# Patient Record
Sex: Male | Born: 1935
Health system: Southern US, Community
[De-identification: ages and names within clinical notes are randomized; demographics above are authoritative.]

## PROBLEM LIST (undated history)

## (undated) DIAGNOSIS — I4891 Unspecified atrial fibrillation: Secondary | ICD-10-CM

## (undated) DIAGNOSIS — H3530 Unspecified macular degeneration: Secondary | ICD-10-CM

## (undated) DIAGNOSIS — I10 Essential (primary) hypertension: Secondary | ICD-10-CM

## (undated) DIAGNOSIS — C801 Malignant (primary) neoplasm, unspecified: Secondary | ICD-10-CM

## (undated) DIAGNOSIS — H409 Unspecified glaucoma: Secondary | ICD-10-CM

## (undated) DIAGNOSIS — I493 Ventricular premature depolarization: Secondary | ICD-10-CM

## (undated) DIAGNOSIS — R1310 Dysphagia, unspecified: Secondary | ICD-10-CM

## (undated) DIAGNOSIS — I509 Heart failure, unspecified: Secondary | ICD-10-CM

## (undated) HISTORY — DX: Dysphagia, unspecified: R13.10

## (undated) HISTORY — PX: TONSILLECTOMY: SUR1361

## (undated) HISTORY — PX: NASAL SEPTUM SURGERY: SHX37

---

## 2006-01-09 ENCOUNTER — Inpatient Hospital Stay (HOSPITAL_COMMUNITY): Admission: EM | Admit: 2006-01-09 | Discharge: 2006-01-10 | Payer: Self-pay | Admitting: Emergency Medicine

## 2007-01-14 ENCOUNTER — Encounter: Admission: RE | Admit: 2007-01-14 | Discharge: 2007-01-14 | Payer: Self-pay | Admitting: Family Medicine

## 2008-04-22 ENCOUNTER — Ambulatory Visit (HOSPITAL_COMMUNITY): Admission: RE | Admit: 2008-04-22 | Discharge: 2008-04-22 | Payer: Self-pay | Admitting: Ophthalmology

## 2008-07-08 ENCOUNTER — Emergency Department (HOSPITAL_COMMUNITY): Admission: EM | Admit: 2008-07-08 | Discharge: 2008-07-09 | Payer: Self-pay | Admitting: Emergency Medicine

## 2008-08-08 HISTORY — PX: OTHER SURGICAL HISTORY: SHX169

## 2009-12-22 ENCOUNTER — Encounter: Admission: RE | Admit: 2009-12-22 | Discharge: 2009-12-22 | Payer: Self-pay | Admitting: Otolaryngology

## 2010-01-12 ENCOUNTER — Emergency Department (HOSPITAL_COMMUNITY): Admission: EM | Admit: 2010-01-12 | Discharge: 2010-01-13 | Payer: Self-pay | Admitting: Emergency Medicine

## 2010-02-05 ENCOUNTER — Encounter (INDEPENDENT_AMBULATORY_CARE_PROVIDER_SITE_OTHER): Payer: Self-pay | Admitting: Otolaryngology

## 2010-02-05 ENCOUNTER — Ambulatory Visit (HOSPITAL_COMMUNITY): Admission: RE | Admit: 2010-02-05 | Discharge: 2010-02-06 | Payer: Self-pay | Admitting: Otolaryngology

## 2010-08-08 HISTORY — PX: OTHER SURGICAL HISTORY: SHX169

## 2010-10-24 LAB — BASIC METABOLIC PANEL
Calcium: 9.3 mg/dL (ref 8.4–10.5)
Creatinine, Ser: 1.07 mg/dL (ref 0.4–1.5)
GFR calc non Af Amer: >60 mL/min (ref >60)
Glucose, Bld: 100 mg/dL — ABNORMAL HIGH (ref 70–99)
Sodium: 136 mEq/L (ref 135–145)

## 2010-10-24 LAB — CBC
MCHC: 33.6 g/dL (ref 30.0–36.0)
MCV: 99.2 fL (ref 78.0–100.0)
RBC: 4.59 MIL/uL (ref 4.22–5.81)
WBC: 8.1 10*3/uL (ref 4.0–10.5)

## 2010-10-25 LAB — BASIC METABOLIC PANEL
CO2: 28 mEq/L (ref 19–32)
Creatinine, Ser: 1.14 mg/dL (ref 0.4–1.5)
GFR calc Af Amer: >60 mL/min (ref >60)
Glucose, Bld: 104 mg/dL — ABNORMAL HIGH (ref 70–99)

## 2010-10-25 LAB — CBC
Hemoglobin: 14.3 g/dL (ref 13.0–17.0)
MCHC: 33.4 g/dL (ref 30.0–36.0)
MCV: 97.9 fL (ref 78.0–100.0)
RDW: 13.6 % (ref 11.5–15.5)
WBC: 7.8 10*3/uL (ref 4.0–10.5)

## 2010-10-25 LAB — DIFFERENTIAL
Basophils Absolute: 0 10*3/uL (ref 0.0–0.1)
Basophils Relative: 0 % (ref 0–1)
Eosinophils Absolute: 0.4 10*3/uL (ref 0.0–0.7)
Eosinophils Relative: 5 % (ref 0–5)
Monocytes Absolute: 1.3 10*3/uL — ABNORMAL HIGH (ref 0.1–1.0)
Neutro Abs: 4.8 10*3/uL (ref 1.7–7.7)

## 2010-10-25 LAB — POCT CARDIAC MARKERS

## 2010-12-24 NOTE — Discharge Summary (Signed)
NAME:  David Acosta, David Acosta NO.:  [id number]   MEDICAL RECORD NO.:  [id number]          PATIENT TYPE:  INP   LOCATION:  A340                          FACILITY:  [facility]   PHYSICIAN:  Margaretmary Dys, M.D.DATE OF BIRTH:  [birth year]   DATE OF ADMISSION:  01/09/2006  DATE OF DISCHARGE:  06/05/2007LH                                 DISCHARGE SUMMARY   DISCHARGE DIAGNOSES:  1.  Acute abdominal pain.  2.  Acute gastroenteritis.  3.  Partial small bowel obstruction.   DISCHARGE MEDICATIONS:  1.  Verapamil 240 mg p.o. once a day.  2.  Hydrochlorothiazide 25 mg once a day.  3.  Aspirin 81 mg once a day.   DIET:  Low salt diet.   ACTIVITY:  Is as tolerated.   SPECIAL INSTRUCTIONS:  Patient is to followup with primary care physician as  needed.  Patient to return to the emergency room if abdominal pain returns  with distention.   SPECIAL INVESTIGATIONS DONE:  A CT scan of the abdomen and pelvis was  reported to be negative.   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  The patient  presented to the emergency room with some aching abdominal pain.  He said  the pain was initially centrally located with  abdominal distention.  The  patient did not have any nausea or vomiting.  He tried unsuccessfully to  induce vomiting to feel better but was not successful.  He subsequently came  to the emergency room.  Initial evaluation in the emergency room revealed a  possible small bowel obstruction on x-rays.  The patient was subsequently  admitted for further workup.   We proceeded with a CT scan of his abdomen and pelvis which was negative.  Patient did have a history of ventral hernia but there was no evidence of  incarceration.  During the course of hospitalization his white blood cell  count was 10.5, hemoglobin 15.9, platelets of 226, urinalysis was negative  and the rest of his labs  were really unremarkable.  Patient was subsequently  on observation status and once CAT scan was negative in the morning the  patient was discharged home in satisfactory condition.   DISPOSITION:  To home.   CONSULTS OBTAINED:  None.      Margaretmary Dys, M.D.  Electronically Signed     AM/MEDQ  D:  01/22/2006  T:  01/22/2006  Job:  [id number]

## 2010-12-24 NOTE — H&P (Signed)
NAME:  David Acosta, David Acosta NO.:  [id number]   MEDICAL RECORD NO.:  [id number]          PATIENT TYPE:  INP   LOCATION:  A340                          FACILITY:  [facility]   PHYSICIAN:  Lonia Blood, M.D.      DATE OF BIRTH:  1926-06-18   DATE OF ADMISSION:  01/09/2006  DATE OF DISCHARGE:  LH                                HISTORY & PHYSICAL   PRIMARY CARE PHYSICIAN:  Gloriajean Dell. Andrey Campanile, M.D.   PRESENTING COMPLAINT:  Abdominal pain.   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  Yesterday morning he woke up normally.  He had a normal bowel movements.  Later, however, he started having some aching abdominal pain.  His pain was  initially centrally located but not all over.  Associated with that was  gradual abdominal distention.  The patient felt uncomfortable but denied any  nausea or vomiting.  He tried unsuccessfully in induce vomiting to feel  better, but that did not happen.  His abdominal pain persisted, hence, he  came to the emergency room.  Initial evaluation in the emergency room  suggested possible small bowel obstruction.  Hence, he is admitted for  further workup.  The patient denied any diarrhea.  No melena.  No bright red  blood per rectum and no hematemesis.   PAST MEDICAL HISTORY:  1.  Significant for hypertension and some glaucoma, especially of the left      eye.  2.  Abdominal hernia.   PAST SURGICAL HISTORY:  None.   ALLERGIES:  No known drug allergies.   MEDICATIONS:  1.  Verapamil 240 mg daily.  2.  Hydrochlorothiazide 25 mg daily.  3.  Aspirin 81 mg daily.   SOCIAL HISTORY:  The patient is a retired Production designer, theatre/television/film at [employer].  He is  a Forensic scientist by profession.  He retired to [employer] from  [place] and stayed at [place].  No history of  tobacco use, but the patient drinks daily, at least 4 beers a day.  As  he  calls it cocktail hour every day between 4:00 and 6:00.   FAMILY HISTORY:  Noncontributory.   REVIEW OF SYSTEMS:  A 10-point review of systems was performed mainly by  HPI.   PHYSICAL EXAMINATION:  VITAL SIGNS:  Temperature 96.6, blood pressure  initial 146/84 and currently 122/70, pulse 68, respirations 20, saturations  95% on room air.  GENERAL APPEARANCE:  The patient is a very pleasant man in no acute  distress.  HEENT:  Pupils equal, round and reactive to light.  EOMI.  NECK:  Supple.  No visible JVD.  No lymphadenopathy.  RESPIRATORY:  The patient has good air entry bilaterally with no wheezes or  rales.  CARDIOVASCULAR:  He has regular rate and rhythm.  ABDOMEN:  Distended, firm, nontender with increased bowel sounds.  RECTAL:  No stool in the rectum.  EXTREMITIES:  No cyanosis, clubbing or edema.   LABORATORY DATA:  White count 10.5,  hemoglobin 15.9, platelets 226,000, with  a left shift of 8.6.  Sodium 136, potassium 3.5, chloride 99, CO2 28,  glucose 158, BUN 15, creatinine 1.1, calcium 9.1, amylase 66, lipase 15.  UA  showed specific gravity of more than 1.030, small bilirubins, small ketones,  small proteins, but negative leukocyte esterase or nitrites.  Urine  microscopy is also negative.  KUB shows multiple air fluid levels with  suggestions of partial small bowel obstruction.   ASSESSMENT:  We have a 77 year old gentleman admitted with sudden onset of  abdominal pain that has gradually gotten worse.  The possible causes of the  patient's abdominal pain includes small bowel obstruction as suggested.  He  had no prior abdominal surgery.  He, however, has a ventral hernia.  Although, it is not incarcerated, he may have had an internal hernia as  well.  Other possibilities, like volvulus, cannot be discounted.  However,  the patient is not having symptoms consistent with that finding.  He is not  having any nausea or vomiting at this point, mainly vague abdominal  pain.   PLAN:  1.  Possible small bowel obstruction.  The patient is currently not having      any nausea or vomiting.  We will first proceed with abdominal pelvic CTU      then Gastrografin if possible.  If the patient tolerates the contrast      and is able to get CT done, will try and find out what is precisely      causing his abdominal pain.  Possibilities of pancreatitis is already      excluded with a normal lipase and amylase at this point.  I have      explained to the patient other possibilities including internal tumors      that may have caused some obstruction.  There is, however, no evidence      of that at this point without doing a CT.  We will keep him n.p.o.,      hydrate the patient.  I will start him on some IV Protonix and also      p.r.n. Zofran or low-dose Phenergan for any nausea or vomiting.  Based      on the results of the CT scan, will proceed further.  Ileus has been      relatively ruled out also as the patient has very vigorous bowel sounds,      in fact, exaggerated at this point.  For pain control, we will use IV      Dilaudid p.r.n. Will try as much as possible to avoid using narcotics as      this can further worsen his abdominal pain.  His abdominal distention      can probably cause an ileus.  2.  Hypertension.  The patient blood pressure seems to be okay at this      point.  Will hold his oral medications until his symptoms resolve.  If      his blood pressure starts rising, will use IV medications including IV      beta blockers.  Once his symptoms resolve, we will resume oral      medications per home dose.  3.  Alcohol use.  The patient has background use of alcohol on a daily      basis.  Will be careful of withdrawal in this case.  I will start him on      some thiamine and folate while in the hospital.  At the same time, we      will have some Ativan p.r.n. if the patient starts having some      withdrawal symptoms. 4.  Glaucoma.  The patient  will continue to use his home medications.  He      has been using eye drops which he describes as possibly Timolol, and we      will have him use his home eye drops while in the hospital.      Lonia Blood, M.D.  Electronically Signed    LG/MEDQ  D:  01/09/2006  T:  01/09/2006  Job:  [id number]

## 2011-05-13 LAB — CBC
HCT: 45.2 % (ref 39.0–52.0)
WBC: 8.8 10*3/uL (ref 4.0–10.5)

## 2011-05-13 LAB — B-NATRIURETIC PEPTIDE (CONVERTED LAB): Pro B Natriuretic peptide (BNP): <30 pg/mL (ref 0.0–100.0)

## 2011-05-13 LAB — DIFFERENTIAL
Basophils Relative: 0 % (ref 0–1)
Eosinophils Absolute: 0.1 10*3/uL (ref 0.0–0.7)
Eosinophils Relative: 2 % (ref 0–5)
Monocytes Absolute: 0.7 10*3/uL (ref 0.1–1.0)
Monocytes Relative: 8 % (ref 3–12)
Neutro Abs: 7.5 10*3/uL (ref 1.7–7.7)

## 2011-05-13 LAB — BASIC METABOLIC PANEL
CO2: 27 mEq/L (ref 19–32)
Calcium: 8.7 mg/dL (ref 8.4–10.5)
Creatinine, Ser: 1.27 mg/dL (ref 0.4–1.5)
GFR calc non Af Amer: 55 mL/min — ABNORMAL LOW (ref >60)
Glucose, Bld: 136 mg/dL — ABNORMAL HIGH (ref 70–99)

## 2011-08-09 DIAGNOSIS — I493 Ventricular premature depolarization: Secondary | ICD-10-CM

## 2011-08-09 HISTORY — DX: Ventricular premature depolarization: I49.3

## 2012-08-08 DEATH — deceased

## 2013-01-09 ENCOUNTER — Emergency Department (HOSPITAL_COMMUNITY)
Admission: EM | Admit: 2013-01-09 | Discharge: 2013-01-10 | Disposition: A | Discharge status: Home / Self Care | Payer: Medicare Other | Attending: Emergency Medicine | Admitting: Emergency Medicine

## 2013-01-09 ENCOUNTER — Emergency Department (HOSPITAL_COMMUNITY): Payer: Medicare Other

## 2013-01-09 ENCOUNTER — Encounter (HOSPITAL_COMMUNITY): Payer: Self-pay | Admitting: Emergency Medicine

## 2013-01-09 DIAGNOSIS — Z79899 Other long term (current) drug therapy: Secondary | ICD-10-CM | POA: Insufficient documentation

## 2013-01-09 DIAGNOSIS — I4949 Other premature depolarization: Secondary | ICD-10-CM | POA: Insufficient documentation

## 2013-01-09 DIAGNOSIS — Z87891 Personal history of nicotine dependence: Secondary | ICD-10-CM | POA: Insufficient documentation

## 2013-01-09 DIAGNOSIS — Z7982 Long term (current) use of aspirin: Secondary | ICD-10-CM | POA: Insufficient documentation

## 2013-01-09 DIAGNOSIS — H409 Unspecified glaucoma: Secondary | ICD-10-CM | POA: Insufficient documentation

## 2013-01-09 DIAGNOSIS — I10 Essential (primary) hypertension: Secondary | ICD-10-CM | POA: Insufficient documentation

## 2013-01-09 DIAGNOSIS — I493 Ventricular premature depolarization: Secondary | ICD-10-CM

## 2013-01-09 DIAGNOSIS — R002 Palpitations: Secondary | ICD-10-CM | POA: Insufficient documentation

## 2013-01-09 HISTORY — DX: Unspecified glaucoma: H40.9

## 2013-01-09 HISTORY — DX: Unspecified macular degeneration: H35.30

## 2013-01-09 HISTORY — DX: Essential (primary) hypertension: I10

## 2013-01-09 LAB — COMPREHENSIVE METABOLIC PANEL
ALT: 18 U/L (ref 0–53)
Albumin: 3.8 g/dL (ref 3.5–5.2)
Alkaline Phosphatase: 114 U/L (ref 39–117)
GFR calc Af Amer: 74 mL/min — ABNORMAL LOW (ref >90)
Glucose, Bld: 107 mg/dL — ABNORMAL HIGH (ref 70–99)
Potassium: 3.8 mEq/L (ref 3.5–5.1)
Sodium: 139 mEq/L (ref 135–145)
Total Protein: 7.6 g/dL (ref 6.0–8.3)

## 2013-01-09 LAB — CBC
HCT: 44.2 % (ref 39.0–52.0)
Hemoglobin: 15 g/dL (ref 13.0–17.0)
WBC: 7.7 10*3/uL (ref 4.0–10.5)

## 2013-01-09 NOTE — ED Notes (Signed)
Patient states began having palpitations around 8pm; states BP was elevated at home.  Denies nausea, vomiting or shortness of breath.  Patient states he is feeling anxious and took one of his wife's Xanax earlier.

## 2013-01-09 NOTE — ED Provider Notes (Signed)
History     This chart was scribed for David Skene, MD, MD by Smitty Pluck, ED Scribe. The patient was seen in room APA02/APA02 and the patient's care was started at 11:38 PM.   CSN: [id number]  Arrival date & time 01/09/13  2246      Chief Complaint  Patient presents with  . Palpitations     The history is provided by the patient and medical records. No language interpreter was used.   HPI Comments: TARICK PARENTEAU is a 79 y.o. male with hx of catheretization (2004), HTN, macular degeneration and glaucoma who presents to the Emergency Department complaining of constant, palpitations onset suddenly today at [facility]. He mentions he took a Xanax without relief. He states he felt his heart would skip beeps even when he tried to rest. He reports hx of similar symptoms but is not sure of cause. He mentions that he has fatigue. Pt denies fever, LOC, dizziness, CP, light headedness hills, nausea, vomiting, diarrhea, weakness, cough, numbness, SOB and any other pain. Pt states he takes verapamil and hydrodiuril.    Pt does not have cardiologist.    Past Medical History  Diagnosis Date  . Glaucoma   . Macular degeneration   . Hypertension     History reviewed. No pertinent past surgical history.  No family history on file.  History  Substance Use Topics  . Smoking status: Former Games developer  . Smokeless tobacco: Not on file  . Alcohol Use: Yes      Review of Systems At least 10pt or greater review of systems completed and are negative except where specified in the HPI.  Allergies  Capsaicin and Procardia xl  Home Medications   Current Outpatient Rx  Name  Route  Sig  Dispense  Refill  . ALPRAZolam (XANAX) 0.5 MG tablet   Oral   Take 0.5 mg by mouth daily as needed for sleep or anxiety.         Marland Kitchen aspirin EC 81 MG tablet   Oral   Take 81 mg by mouth daily.         . bimatoprost (LUMIGAN) 0.03 % ophthalmic solution   Both Eyes   Place 1 drop into both eyes at  bedtime.         . brinzolamide (AZOPT) 1 % ophthalmic suspension   Left Eye   Place 1 drop into the left eye 3 (three) times daily.         . hydrochlorothiazide (HYDRODIURIL) 25 MG tablet   Oral   Take 25 mg by mouth daily.         . verapamil (VERELAN PM) 240 MG 24 hr capsule   Oral   Take 240 mg by mouth daily.           BP 135/68  Pulse 78  Temp(Src) 97.9 F (36.6 C) (Oral)  Resp 18  Ht 5\' 10"  (1.778 m)  Wt 205 lb (92.987 kg)  BMI 29.41 kg/m2  SpO2 97%  Physical Exam  Nursing note and vitals reviewed.   Nursing notes reviewed.  Electronic medical record reviewed. VITAL SIGNS:   Filed Vitals:   01/09/13 2303 01/09/13 2330 01/10/13 0030 01/10/13 0045  BP: 135/68     Pulse: 78 63 62 56  Temp: 97.9 F (36.6 C)     TempSrc: Oral     Resp: 18 17 12 19   Height: 5\' 10"  (1.778 m)     Weight: 205 lb (92.987 kg)  SpO2: 97% 93% 95% 94%   CONSTITUTIONAL: Awake, oriented, appears non-toxic HENT: Atraumatic, normocephalic, oral mucosa pink and moist, airway patent. Nares patent without drainage. External ears normal. EYES: Conjunctiva clear, EOMI, PERRLA NECK: Trachea midline, non-tender, supple CARDIOVASCULAR: Normal heart rate, occasional PVCs, No murmurs, rubs, gallops PULMONARY/CHEST: Clear to auscultation, no rhonchi, wheezes, or rales. Symmetrical breath sounds. Non-tender. ABDOMINAL: Non-distended, soft, non-tender - no rebound or guarding.  BS normal. NEUROLOGIC: Non-focal, moving all four extremities, no gross sensory or motor deficits. EXTREMITIES: No clubbing, cyanosis. 1+ bilateral lower extremity pitting edema SKIN: Warm, Dry, No erythema, No rash   ED Course  Procedures (including critical care time) DIAGNOSTIC STUDIES: Oxygen Saturation is 97% on room air, normal by my interpretation.    COORDINATION OF CARE: 11:42 PM Discussed ED treatment with pt and pt agrees.   Date: 01/10/2013  Rate: 80  Rhythm: normal sinus rhythm  QRS Axis:  normal  Intervals: normal  ST/T Wave abnormalities: Nonspecific T-wave flattening  Conduction Disutrbances: none  Narrative Interpretation: Multifocal PVCs, no prior EKG to compare with, nonspecific T-wave flattening    Labs Reviewed  COMPREHENSIVE METABOLIC PANEL - Abnormal; Notable for the following:    Glucose, Bld 107 (*)    GFR calc non Af Amer 64 (*)    GFR calc Af Amer 74 (*)    All other components within normal limits  CBC  TROPONIN I   Dg Chest Portable 1 View  01/09/2013   *RADIOLOGY REPORT*  Clinical Data: Palpitations, history of hypertension  PORTABLE CHEST - 1 VIEW  Comparison: 02/02/2010  Findings: Heart size at upper limits of normal. Minimal curvilinear left lower lobe greater than right presumed atelectasis.  No new focal pulmonary opacity otherwise.  No pleural effusion.  No acute osseous finding.  IMPRESSION: No acute cardiopulmonary process besides bibasilar presumed atelectasis.   Original Report Authenticated By: Christiana Pellant, M.D.     1. Palpitations   2. Unifocal PVCs       MDM  Patient presents with palpitations. An EKG patient has a couple multiple focal PVCs, however in watching the monitor, they seem to be unifocal. Patient is not having any chest pain, no shortness of breath, no suggestions of pulmonary embolism-well score of 0, acute coronary syndrome. Based on EKG possible multifocal PVCs obtained electrolytes and labs, magnesium and potassium are within normal limits, sodium is also within normal limits. Patient is taking Verapamil.  Troponin unremarkable. No ischemic changes seen on EKG. At this point, the patient feels well, he's got no complaints, no chest pains, no dizziness, no dyspnea no dyspnea on exertion or dizziness on exertion. No heart murmurs. Will have the patient followup with his primary care provider. No medical emergency identified at this time. I do not think this patient is at increased risk of V. tach or ACS.  I personally  performed the services described in this documentation, which was scribed in my presence. The recorded information has been reviewed and is accurate. David Acosta, M.D.      David Skene, MD 01/10/13 [id number]

## 2013-01-10 NOTE — ED Notes (Signed)
Discharge instructions reviewed with pt, questions answered. Pt verbalized understanding.  

## 2013-03-12 ENCOUNTER — Ambulatory Visit (INDEPENDENT_AMBULATORY_CARE_PROVIDER_SITE_OTHER): Payer: Medicare Other | Admitting: Cardiovascular Disease

## 2013-03-12 ENCOUNTER — Encounter: Payer: Self-pay | Admitting: *Deleted

## 2013-03-12 ENCOUNTER — Encounter: Payer: Self-pay | Admitting: Cardiovascular Disease

## 2013-03-12 VITALS — BP 154/82 | HR 73 | Ht 70.0 in | Wt 211.0 lb

## 2013-03-12 DIAGNOSIS — I4949 Other premature depolarization: Secondary | ICD-10-CM

## 2013-03-12 DIAGNOSIS — I493 Ventricular premature depolarization: Secondary | ICD-10-CM

## 2013-03-12 DIAGNOSIS — R0989 Other specified symptoms and signs involving the circulatory and respiratory systems: Secondary | ICD-10-CM

## 2013-03-12 DIAGNOSIS — R0600 Dyspnea, unspecified: Secondary | ICD-10-CM

## 2013-03-12 DIAGNOSIS — I10 Essential (primary) hypertension: Secondary | ICD-10-CM

## 2013-03-12 NOTE — Patient Instructions (Addendum)
Your physician recommends that you schedule a follow-up appointment in: POST TESTING WITH DR PN  Your physician has requested that you have an echocardiogram. Echocardiography is a painless test that uses sound waves to create images of your heart. It provides your doctor with information about the size and shape of your heart and how well your heart's chambers and valves are working. This procedure takes approximately one hour. There are no restrictions for this procedure.  Your physician has recommended that you wear a holter monitor. Holter monitors are medical devices that record the heart's electrical activity. Doctors most often use these monitors to diagnose arrhythmias. Arrhythmias are problems with the speed or rhythm of the heartbeat. The monitor is a small, portable device. You can wear one while you do your normal daily activities. This is usually used to diagnose what is causing palpitations/syncope (passing out).FOR 48 HOUR  Your physician has requested that you have an exercise tolerance test. For further information please visit [email or web address] Please also follow instruction sheet, as given.

## 2013-03-12 NOTE — Progress Notes (Signed)
Patient ID: David Acosta, male   DOB: August 25, 1925, 79 y.o.   MRN: [id number] 79 yo seen in ER early June for palpitations and found to have PVC;s. Chronic since early 70;s  Previous cath in 2002 normal.  No syncope chest pain and only mild exertional dyspnea ER evaluaition normal  Not tolerant to beta blockers does better on calcium blockers like Calan.  Feels skips but not usually persistant rapid tachycardia.  Symptoms not worse with exercise Walked a mile yesterday with no issues.  No excess caffienne or ETOH   ROS: Denies fever, malais, weight loss, blurry vision, decreased visual acuity, cough, sputum, SOB, hemoptysis, pleuritic pain, palpitaitons, heartburn, abdominal pain, melena, lower extremity edema, claudication, or rash.  All other systems reviewed and negative   General: Affect appropriate Healthy:  appears stated age HEENT: normal Neck supple with no adenopathy JVP normal no bruits no thyromegaly Lungs clear with no wheezing and good diaphragmatic motion Heart:  S1/S2 no murmur,rub, gallop or click PMI normal Abdomen: benighn, BS positve, no tenderness, no AAA no bruit.  No HSM or HJR Distal pulses intact with no bruits No edema Neuro non-focal Skin warm and dry No muscular weakness  Medications Current Outpatient Prescriptions  Medication Sig Dispense Refill  . ALPRAZolam (XANAX) 0.5 MG tablet Take 0.5 mg by mouth daily as needed for sleep or anxiety.      Marland Kitchen aspirin EC 81 MG tablet Take 81 mg by mouth daily.      . bimatoprost (LUMIGAN) 0.03 % ophthalmic solution Place 1 drop into both eyes at bedtime.      . brinzolamide (AZOPT) 1 % ophthalmic suspension Place 1 drop into the left eye 3 (three) times daily.      . hydrochlorothiazide (HYDRODIURIL) 25 MG tablet Take 25 mg by mouth daily.      . verapamil (VERELAN PM) 240 MG 24 hr capsule Take 240 mg by mouth daily.       No current facility-administered medications for this visit.    Allergies Capsaicin and  Procardia xl  Family History: No family history on file.  Social History: History   Social History  . Marital Status: Married    Spouse Name: N/A    Number of Children: N/A  . Years of Education: N/A   Occupational History  . Not on file.   Social History Main Topics  . Smoking status: Former Games developer  . Smokeless tobacco: Not on file  . Alcohol Use: Yes  . Drug Use: No  . Sexually Active: Not on file   Other Topics Concern  . Not on file   Social History Narrative  . No narrative on file    Electrocardiogram:  6/14  SR rate 80  PVC no acute ischemic changes or NSVT  Assessment and Plan

## 2013-03-12 NOTE — Addendum Note (Signed)
Addended by: Derry Lory A on: 03/12/2013 02:34 PM   Modules accepted: Orders

## 2013-03-12 NOTE — Assessment & Plan Note (Signed)
Seem benign Echo to assess EF ETT to assess hemodynamic response to exercise and r/o exercise induced VT.  Will also have 24 hr holter to quantitate % of beats as PVC t guide Rx

## 2013-03-12 NOTE — Assessment & Plan Note (Signed)
Consider stopping beta blocker which he does not like and diuretic which may exacerbate low K/Mg and PVCls next visit

## 2013-03-13 ENCOUNTER — Other Ambulatory Visit: Payer: Self-pay

## 2013-03-21 ENCOUNTER — Ambulatory Visit (HOSPITAL_COMMUNITY)
Admission: RE | Admit: 2013-03-21 | Discharge: 2013-03-21 | Disposition: A | Discharge status: Home / Self Care | Payer: Medicare Other | Source: Ambulatory Visit | Attending: Cardiovascular Disease | Admitting: Cardiovascular Disease

## 2013-03-21 ENCOUNTER — Encounter: Payer: Self-pay | Admitting: Cardiology

## 2013-03-21 DIAGNOSIS — R0609 Other forms of dyspnea: Secondary | ICD-10-CM

## 2013-03-21 DIAGNOSIS — R0600 Dyspnea, unspecified: Secondary | ICD-10-CM

## 2013-03-21 DIAGNOSIS — R0989 Other specified symptoms and signs involving the circulatory and respiratory systems: Secondary | ICD-10-CM

## 2013-03-21 DIAGNOSIS — R002 Palpitations: Secondary | ICD-10-CM | POA: Insufficient documentation

## 2013-03-21 DIAGNOSIS — I4949 Other premature depolarization: Secondary | ICD-10-CM

## 2013-03-21 DIAGNOSIS — I493 Ventricular premature depolarization: Secondary | ICD-10-CM

## 2013-03-21 DIAGNOSIS — I517 Cardiomegaly: Secondary | ICD-10-CM

## 2013-03-21 DIAGNOSIS — I499 Cardiac arrhythmia, unspecified: Secondary | ICD-10-CM | POA: Insufficient documentation

## 2013-03-21 DIAGNOSIS — I10 Essential (primary) hypertension: Secondary | ICD-10-CM | POA: Insufficient documentation

## 2013-03-21 NOTE — Progress Notes (Signed)
Stress Lab Nurses Notes - [facility]  KANON NOVOSEL 03/21/2013 Reason for doing test: Arrhythmia/Palpitations Type of test: Regular GTX Nurse performing test: Parke Poisson, RN Nuclear Medicine Tech: Not Applicable Echo Tech: Not Applicable MD performing test: Ival Bible / Joni Reining NP Family MD: Andrey Campanile  Test explained and consent signed: yes IV started: No IV started Symptoms: SOB & Fatigue Treatment/Intervention: None Reason test stopped: fatigue After recovery IV was: NA Patient to return to Nuc. Med at : NA Patient discharged: Home Patient's Condition upon discharge was: stable Comments: During test  BP 128/79 & HR 117.  Recovery BP 154/78 & HR 76.  Symptoms resolved in recovery. Erskine Speed T

## 2013-03-21 NOTE — Progress Notes (Signed)
*  PRELIMINARY RESULTS* Echocardiogram 24H Holter monitor has been performed.  David Acosta 03/21/2013, 11:38 AM

## 2013-03-21 NOTE — Progress Notes (Signed)
Stress Lab Nurses Notes - David Acosta   [name]  03/21/2013  Reason for doing test: Arrhythmia/Palpitations  Type of test: Regular GTX  Nurse performing test: Parke Poisson, RN  Nuclear Medicine Tech: Not Applicable  Echo Tech: Not Applicable  MD performing test: Ival Bible / Joni Reining NP  Family MD: Andrey Campanile  Test explained and consent signed: yes  IV started: No IV started  Symptoms: SOB & Fatigue  Treatment/Intervention: None  Reason test stopped: fatigue  After recovery IV was: NA  Patient to return to Nuc. Med at : NA  Patient discharged: Home  Patient's Condition upon discharge was: stable  Comments: During test BP 128/79 & HR 117. Recovery BP 154/78 & HR 76. Symptoms resolved in recovery.  David Acosta  Attending note:  David Acosta, 4.6 METS, 88% MPHR. Limited exercise time, no chest pain. Occasional PVCs, more frequent in early recovery with brief run of PVCs and fusion beats (4 beats) and some bigeminy. No diagnostic ST segment abnormalities. No sustained arrhythmias. Peak BP 154/75. Overall negative for ischemia at low level exercise and adequate heart rate response.  Jonelle Sidle, M.D., F.A.C.C.

## 2013-03-21 NOTE — Progress Notes (Signed)
*  PRELIMINARY RESULTS* Echocardiogram 2D Echocardiogram has been performed.  David Acosta 03/21/2013, 11:37 AM

## 2013-03-21 NOTE — Progress Notes (Signed)
Normal stress test

## 2013-03-22 ENCOUNTER — Telehealth: Payer: Self-pay | Admitting: Cardiovascular Disease

## 2013-03-22 ENCOUNTER — Telehealth: Payer: Self-pay | Admitting: *Deleted

## 2013-03-22 NOTE — Telephone Encounter (Signed)
F/up  Returning a call from Christine// Pt is not sure of the nature of the call.

## 2013-03-22 NOTE — Telephone Encounter (Signed)
Message copied by Thompson Grayer on Fri Mar 22, 2013  3:30 PM ------      Message from: Wendall Stade      Created: Thu Mar 21, 2013  5:06 PM                   ----- Message -----         From: Jonelle Sidle, MD         Sent: 03/21/2013   4:04 PM           To: Wendall Stade, MD             ------

## 2013-03-22 NOTE — Telephone Encounter (Signed)
  Spoke to patient concerning lab/test results/instructions from provider. Patient understood.    Stress Lab Nurses Notes - [facility]  EUSEVIO SCHRIVER  03/21/2013  Reason for doing test: Arrhythmia/Palpitations  Type of test: Regular GTX  Nurse performing test: Parke Poisson, RN  Nuclear Medicine Tech: Not Applicable  Echo Tech: Not Applicable  MD performing test: Ival Bible / Joni Reining NP  Family MD: Andrey Campanile  Test explained and consent signed: yes  IV started: No IV started  Symptoms: SOB & Fatigue  Treatment/Intervention: None  Reason test stopped: fatigue  After recovery IV was: NA  Patient to return to Nuc. Med at : NA  Patient discharged: Home  Patient's Condition upon discharge was: stable  Comments: During test BP 128/79 & HR 117. Recovery BP 154/78 & HR 76. Symptoms resolved in recovery.  Erskine Speed T  Attending note:  Bruce protocol, 4.6 METS, 88% MPHR. Limited exercise time, no chest pain. Occasional PVCs, more frequent in early recovery with brief run of PVCs and fusion beats (4 beats) and some bigeminy. No diagnostic ST segment abnormalities. No sustained arrhythmias. Peak BP 154/75. Overall negative for ischemia at low level exercise and adequate heart rate response.  Jonelle Sidle, M.D., F.A.C.C.       Wendall Stade, MD at 03/21/2013 5:06 PM    Status: Signed             Normal stress test

## 2013-03-22 NOTE — Telephone Encounter (Signed)
PT AWARE OF ECHO RESULTS./CY 

## 2013-03-22 NOTE — Telephone Encounter (Signed)
New Problem  Pt returning call// states he was not informed as to what the nature of the call was// please assist

## 2013-04-03 ENCOUNTER — Encounter: Payer: Self-pay | Admitting: Cardiovascular Disease

## 2013-04-03 ENCOUNTER — Ambulatory Visit (INDEPENDENT_AMBULATORY_CARE_PROVIDER_SITE_OTHER): Payer: Medicare Other | Admitting: Cardiovascular Disease

## 2013-04-03 VITALS — BP 150/81 | HR 55 | Ht 70.0 in | Wt 213.0 lb

## 2013-04-03 DIAGNOSIS — I4949 Other premature depolarization: Secondary | ICD-10-CM

## 2013-04-03 DIAGNOSIS — I10 Essential (primary) hypertension: Secondary | ICD-10-CM

## 2013-04-03 DIAGNOSIS — I493 Ventricular premature depolarization: Secondary | ICD-10-CM

## 2013-04-03 NOTE — Patient Instructions (Addendum)
Your physician recommends that you schedule a follow-up appointment in: 6 months You will receive a reminder letter two months in advance reminding you to call and schedule your appointment. If you don't receive this letter, please contact our office.  Your physician recommends that you continue on your current medications as directed. Please refer to the Current Medication list given to you today.     

## 2013-04-03 NOTE — Assessment & Plan Note (Signed)
Stable with no pre-syncope  No ischemia on ETT and normal EF  Can try beta blocker in future if needed  Something like bystolic may be best.

## 2013-04-03 NOTE — Assessment & Plan Note (Signed)
Well controlled.  Continue current medications and low sodium Dash type diet.    

## 2013-04-03 NOTE — Progress Notes (Signed)
Patient ID: David Acosta, male   DOB: 22-Jan-1926, 79 y.o.   MRN: [id number] 79 yo seen in ER early June for palpitations and found to have PVC;s. Chronic since early 70;s Previous cath in 2002 normal. No syncope chest pain and only mild exertional dyspnea  ER evaluaition normal Not tolerant to beta blockers does better on calcium blockers like Calan. Feels skips but not usually persistant rapid tachycardia. Symptoms not worse with exercise Walked a mile yesterday with no issues. No excess caffienne or ETOH  F/U ETT normal Holter PVC;s only 3% of beats  Echo EF 60-65%   There is a question of beta blocker intolerance.  Was on them in the 70;s  Had short couple of day trial of metoprolol afte rER visit and thought palpitations were worse but not a good trial  ROS: Denies fever, malais, weight loss, blurry vision, decreased visual acuity, cough, sputum, SOB, hemoptysis, pleuritic pain, palpitaitons, heartburn, abdominal pain, melena, lower extremity edema, claudication, or rash.  All other systems reviewed and negative  General: Affect appropriate Healthy:  appears stated age HEENT: normal Neck supple with no adenopathy JVP normal no bruits no thyromegaly Lungs clear with no wheezing and good diaphragmatic motion Heart:  S1/S2 no murmur, no rub, gallop or click PMI normal Abdomen: benighn, BS positve, no tenderness, no AAA no bruit.  No HSM or HJR Distal pulses intact with no bruits No edema Neuro non-focal Skin warm and dry No muscular weakness   Current Outpatient Prescriptions  Medication Sig Dispense Refill  . aspirin EC 81 MG tablet Take 81 mg by mouth daily.      . brinzolamide (AZOPT) 1 % ophthalmic suspension Place 1 drop into the left eye 3 (three) times daily.      . hydrochlorothiazide (HYDRODIURIL) 25 MG tablet Take 25 mg by mouth daily.      . timolol (TIMOPTIC) 0.5 % ophthalmic solution       . TRAVATAN Z 0.004 % SOLN ophthalmic solution       . verapamil (VERELAN  PM) 240 MG 24 hr capsule Take 240 mg by mouth daily.       No current facility-administered medications for this visit.    Allergies  Capsaicin and Procardia xl  Electrocardiogram:  6/4/ SR rate 80 PVC  Nonspecific ST/T wave changes   Assessment and Plan

## 2013-06-13 ENCOUNTER — Other Ambulatory Visit: Payer: Self-pay

## 2013-10-31 ENCOUNTER — Ambulatory Visit: Payer: Medicare Other | Admitting: Cardiovascular Disease

## 2013-11-25 ENCOUNTER — Encounter: Payer: Self-pay | Admitting: Cardiovascular Disease

## 2013-11-26 ENCOUNTER — Ambulatory Visit (INDEPENDENT_AMBULATORY_CARE_PROVIDER_SITE_OTHER): Payer: Medicare Other | Admitting: Cardiovascular Disease

## 2013-11-26 ENCOUNTER — Encounter: Payer: Self-pay | Admitting: Cardiovascular Disease

## 2013-11-26 VITALS — BP 130/72 | HR 80 | Ht 70.0 in | Wt 203.0 lb

## 2013-11-26 DIAGNOSIS — I4949 Other premature depolarization: Secondary | ICD-10-CM

## 2013-11-26 DIAGNOSIS — I10 Essential (primary) hypertension: Secondary | ICD-10-CM

## 2013-11-26 DIAGNOSIS — I493 Ventricular premature depolarization: Secondary | ICD-10-CM

## 2013-11-26 NOTE — Patient Instructions (Signed)
Your physician recommends that you continue on your current medications as directed. Please refer to the Current Medication list given to you today.  Your physician wants you to follow-up in: 1 year with Dr. [name]. You will receive a reminder letter in the mail two months in advance. If you don't receive a letter, please call our office to schedule the follow-up appointment.  

## 2013-11-26 NOTE — Progress Notes (Signed)
Patient ID: David Acosta, male   DOB: [birth year], 80 y.o.   MRN: [id number] 80 yo seen in ER early June for palpitations and found to have PVC;s. Chronic since early 70;s Previous cath in 2002 normal. No syncope chest pain and only mild exertional dyspnea  ER evaluaition normal Not tolerant to beta blockers does better on calcium blockers like Calan. Feels skips but not usually persistant rapid tachycardia. Symptoms not worse with exercise Walked a mile yesterday with no issues. No excess caffienne or ETOH  F/U ETT normal  Holter PVC;s only 3% of beats  Echo EF 60-65%   There is a question of beta blocker intolerance. Was on them in the 70;s Had short couple of day trial of metoprolol afte rER visit and thought palpitations were worse but not a good trial Subsequently placed on verapamil and is asymptomatic  Sees Dr Redmond Pulling at [facility] Had an idiopathic rash that has resolved with some topical cortisone after a while    ROS: Denies fever, malais, weight loss, blurry vision, decreased visual acuity, cough, sputum, SOB, hemoptysis, pleuritic pain, palpitaitons, heartburn, abdominal pain, melena, lower extremity edema, claudication, or rash.  All other systems reviewed and negative  General: Affect appropriate Overweight elderly male  HEENT: normal Neck supple with no adenopathy JVP normal no bruits no thyromegaly Lungs clear with no wheezing and good diaphragmatic motion Heart:  S1/S2 no murmur, no rub, gallop or click PMI normal Abdomen: benighn, BS positve, no tenderness, no AAA ventral hernia no bruit.  No HSM or HJR Distal pulses intact with no bruits No edema Neuro non-focal Skin warm and dry No muscular weakness   Current Outpatient Prescriptions  Medication Sig Dispense Refill  . aspirin EC 81 MG tablet Take 81 mg by mouth daily.      . brinzolamide (AZOPT) 1 % ophthalmic suspension Place 1 drop into the left eye 3 (three) times daily.      . hydrochlorothiazide  (HYDRODIURIL) 25 MG tablet Take 25 mg by mouth daily.      . timolol (TIMOPTIC) 0.5 % ophthalmic solution       . TRAVATAN Z 0.004 % SOLN ophthalmic solution       . verapamil (VERELAN PM) 240 MG 24 hr capsule Take 240 mg by mouth daily.       No current facility-administered medications for this visit.    Allergies  Capsaicin and Procardia xl  Electrocardiogram:  SR PVC;s low voltage  6/14   Assessment and Plan

## 2013-11-26 NOTE — Assessment & Plan Note (Signed)
Well controlled.  Continue current medications and low sodium Dash type diet.    

## 2013-11-26 NOTE — Assessment & Plan Note (Signed)
Appear less frequent on calcium blocker  Asymptomatic with normal EF and no CAD.  Stable continue current Rx

## 2014-01-21 NOTE — Patient Instructions (Signed)
Your procedure is scheduled on:  01/28/14  Report to [facility] at 08:30AM.  Call this number if you have problems the morning of surgery: [phone]   Remember:   Do not eat food or drink liquids after midnight.   Take these medicines the morning of surgery with A SIP OF WATER: Verapamil and Hydrochlorothiazide.   Do not wear jewelry, make-up or nail polish.  Do not wear lotions, powders, or perfumes. You may wear deodorant.  Do not bring valuables to the hospital.  [facility] is not responsible for any belongings or valuables.               Contacts, dentures or bridgework may not be worn into surgery.               Patients discharged the day of surgery will not be allowed to drive home.   Special Instructions: Start using your eye drops prior to surgery as directed by your eye doctor.   Please read over the following fact sheets that you were given: Anesthesia Post-op Instructions and Care and Recovery After Surgery     Cataract Surgery  A cataract is a clouding of the lens of the eye. When a lens becomes cloudy, vision is reduced based on the degree and nature of the clouding. Surgery may be needed to improve vision. Surgery removes the cloudy lens and usually replaces it with a substitute lens (intraocular lens, IOL). LET YOUR EYE DOCTOR KNOW ABOUT:  Allergies to food or medicine.  Medicines taken including herbs, eyedrops, over-the-counter medicines, and creams.  Use of steroids (by mouth or creams).  Previous problems with anesthetics or numbing medicine.  History of bleeding problems or blood clots.  Previous surgery.  Other health problems, including diabetes and kidney problems.  Possibility of pregnancy, if this applies. RISKS AND COMPLICATIONS  Infection.  Inflammation of the eyeball (endophthalmitis) that can spread to both eyes (sympathetic ophthalmia).  Poor wound healing.  If an IOL is inserted, it can later fall out of proper position. This is  very uncommon.  Clouding of the part of your eye that holds an IOL in place. This is called an "after-cataract." These are uncommon, but easily treated. BEFORE THE PROCEDURE  Do not eat or drink anything except small amounts of water for 8 to 12 before your surgery, or as directed by your caregiver.  Unless you are told otherwise, continue any eyedrops you have been prescribed.  Talk to your primary caregiver about all other medicines that you take (both prescription and non-prescription). In some cases, you may need to stop or change medicines near the time of your surgery. This is most important if you are taking blood-thinning medicine.Do not stop medicines unless you are told to do so.  Arrange for someone to drive you to and from the procedure.  Do not put contact lenses in either eye on the day of your surgery. PROCEDURE There is more than one method for safely removing a cataract. Your doctor can explain the differences and help determine which is best for you. Phacoemulsification surgery is the most common form of cataract surgery.  An injection is given behind the eye or eyedrops are given to make this a painless procedure.  A small cut (incision) is made on the edge of the clear, dome-shaped surface that covers the front of the eye (cornea).  A tiny probe is painlessly inserted into the eye. This device gives off ultrasound waves that soften and break up  the cloudy center of the lens. This makes it easier for the cloudy lens to be removed by suction.  An IOL may be implanted.  The normal lens of the eye is covered by a clear capsule. Part of that capsule is intentionally left in the eye to support the IOL.  Your surgeon may or may not use stitches to close the incision. There are other forms of cataract surgery that require a larger incision and stiches to close the eye. This approach is taken in cases where the doctor feels that the cataract cannot be easily removed using  phacoemulsification. AFTER THE PROCEDURE  When an IOL is implanted, it does not need care. It becomes a permanent part of your eye and cannot be seen or felt.  Your doctor will schedule follow-up exams to check on your progress.  Review your other medicines with your doctor to see which can be resumed after surgery.  Use eyedrops or take medicine as prescribed by your doctor. Document Released: 07/14/2011 Document Revised: 10/17/2011 Document Reviewed: 07/14/2011 [facility] Patient Information 2013 [place].    PATIENT INSTRUCTIONS POST-ANESTHESIA  IMMEDIATELY FOLLOWING SURGERY:  Do not drive or operate machinery for the first twenty four hours after surgery.  Do not make any important decisions for twenty four hours after surgery or while taking narcotic pain medications or sedatives.  If you develop intractable nausea and vomiting or a severe headache please notify your doctor immediately.  FOLLOW-UP:  Please make an appointment with your surgeon as instructed. You do not need to follow up with anesthesia unless specifically instructed to do so.  WOUND CARE INSTRUCTIONS (if applicable):  Keep a dry clean dressing on the anesthesia/puncture wound site if there is drainage.  Once the wound has quit draining you may leave it open to air.  Generally you should leave the bandage intact for twenty four hours unless there is drainage.  If the epidural site drains for more than 36-48 hours please call the anesthesia department.  QUESTIONS?:  Please feel free to call your physician or the hospital operator if you have any questions, and they will be happy to assist you.

## 2014-01-22 ENCOUNTER — Encounter (HOSPITAL_COMMUNITY): Payer: Self-pay | Admitting: Pharmacy Technician

## 2014-01-22 ENCOUNTER — Other Ambulatory Visit: Payer: Self-pay

## 2014-01-22 ENCOUNTER — Encounter (HOSPITAL_COMMUNITY): Payer: Self-pay

## 2014-01-22 ENCOUNTER — Encounter (HOSPITAL_COMMUNITY)
Admission: RE | Admit: 2014-01-22 | Discharge: 2014-01-22 | Disposition: A | Discharge status: Home / Self Care | Payer: Medicare Other | Source: Ambulatory Visit | Attending: Ophthalmology | Admitting: Ophthalmology

## 2014-01-22 DIAGNOSIS — Z01810 Encounter for preprocedural cardiovascular examination: Secondary | ICD-10-CM | POA: Insufficient documentation

## 2014-01-22 DIAGNOSIS — Z01812 Encounter for preprocedural laboratory examination: Secondary | ICD-10-CM | POA: Insufficient documentation

## 2014-01-22 HISTORY — DX: Ventricular premature depolarization: I49.3

## 2014-01-22 LAB — BASIC METABOLIC PANEL
BUN: 17 mg/dL (ref 6–23)
CALCIUM: 8.8 mg/dL (ref 8.4–10.5)
CO2: 29 mEq/L (ref 19–32)
CREATININE: 0.95 mg/dL (ref 0.50–1.35)
Chloride: 102 mEq/L (ref 96–112)
GFR, EST AFRICAN AMERICAN: 86 mL/min — AB (ref >90)
GFR, EST NON AFRICAN AMERICAN: 74 mL/min — AB (ref >90)
GLUCOSE: 123 mg/dL — AB (ref 70–99)
Potassium: 4 mEq/L (ref 3.7–5.3)
Sodium: 143 mEq/L (ref 137–147)

## 2014-01-22 LAB — HEMOGLOBIN AND HEMATOCRIT, BLOOD
HCT: 43.2 % (ref 39.0–52.0)
HEMOGLOBIN: 14.3 g/dL (ref 13.0–17.0)

## 2014-01-22 NOTE — Progress Notes (Signed)
01/22/14 1504  OBSTRUCTIVE SLEEP APNEA  Have you ever been diagnosed with sleep apnea through a sleep study? No  Do you snore loudly (loud enough to be heard through closed doors)?  1  Do you often feel tired, fatigued, or sleepy during the daytime? 0  Has anyone observed you stop breathing during your sleep? 0  Do you have, or are you being treated for high blood pressure? 1  BMI more than 35 kg/m2? 0  Age over 78 years old? 1  Neck circumference greater than 40 cm/16 inches? 0  Gender: 1  Obstructive Sleep Apnea Score 4  Score 4 or greater  Results sent to PCP

## 2014-01-28 ENCOUNTER — Encounter (HOSPITAL_COMMUNITY)
Admission: RE | Disposition: A | Discharge status: Home / Self Care | Payer: Self-pay | Source: Ambulatory Visit | Attending: Ophthalmology

## 2014-01-28 ENCOUNTER — Ambulatory Visit (HOSPITAL_COMMUNITY): Payer: Medicare Other | Admitting: Anesthesiology

## 2014-01-28 ENCOUNTER — Ambulatory Visit (HOSPITAL_COMMUNITY)
Admission: RE | Admit: 2014-01-28 | Discharge: 2014-01-28 | Disposition: A | Discharge status: Home / Self Care | Payer: Medicare Other | Source: Ambulatory Visit | Attending: Ophthalmology | Admitting: Ophthalmology

## 2014-01-28 ENCOUNTER — Encounter (HOSPITAL_COMMUNITY): Payer: Medicare Other | Admitting: Anesthesiology

## 2014-01-28 ENCOUNTER — Encounter (HOSPITAL_COMMUNITY): Payer: Self-pay | Admitting: *Deleted

## 2014-01-28 DIAGNOSIS — Z79899 Other long term (current) drug therapy: Secondary | ICD-10-CM | POA: Insufficient documentation

## 2014-01-28 DIAGNOSIS — Z87891 Personal history of nicotine dependence: Secondary | ICD-10-CM | POA: Insufficient documentation

## 2014-01-28 DIAGNOSIS — H2510 Age-related nuclear cataract, unspecified eye: Secondary | ICD-10-CM | POA: Insufficient documentation

## 2014-01-28 DIAGNOSIS — I10 Essential (primary) hypertension: Secondary | ICD-10-CM | POA: Insufficient documentation

## 2014-01-28 DIAGNOSIS — Z7982 Long term (current) use of aspirin: Secondary | ICD-10-CM | POA: Insufficient documentation

## 2014-01-28 HISTORY — PX: CATARACT EXTRACTION W/PHACO: SHX586

## 2014-01-28 SURGERY — PHACOEMULSIFICATION, CATARACT, WITH IOL INSERTION
Anesthesia: Monitor Anesthesia Care | Site: Eye | Laterality: Left

## 2014-01-28 MED ORDER — KETOROLAC TROMETHAMINE 0.5 % OP SOLN
1.0000 [drp] | OPHTHALMIC | Status: AC
  Administered 2014-01-28 (×3): 1 [drp] via OPHTHALMIC

## 2014-01-28 MED ORDER — TETRACAINE 0.5 % OP SOLN OPTIME - NO CHARGE
OPHTHALMIC | Status: DC | PRN
Start: 2014-01-28 — End: 2014-01-28
  Administered 2014-01-28: 2 [drp] via OPHTHALMIC

## 2014-01-28 MED ORDER — MIDAZOLAM HCL 2 MG/2ML IJ SOLN
INTRAMUSCULAR | Status: AC
  Filled 2014-01-28: qty 2

## 2014-01-28 MED ORDER — TETRACAINE HCL 0.5 % OP SOLN
1.0000 [drp] | OPHTHALMIC | Status: AC
  Administered 2014-01-28 (×3): 1 [drp] via OPHTHALMIC

## 2014-01-28 MED ORDER — CYCLOPENTOLATE-PHENYLEPHRINE OP SOLN OPTIME - NO CHARGE
OPHTHALMIC | Status: AC
  Filled 2014-01-28: qty 2

## 2014-01-28 MED ORDER — TETRACAINE HCL 0.5 % OP SOLN
OPHTHALMIC | Status: AC
  Filled 2014-01-28: qty 2

## 2014-01-28 MED ORDER — EPINEPHRINE HCL 1 MG/ML IJ SOLN
INTRAMUSCULAR | Status: AC
  Filled 2014-01-28: qty 1

## 2014-01-28 MED ORDER — FENTANYL CITRATE 0.05 MG/ML IJ SOLN
INTRAMUSCULAR | Status: AC
  Filled 2014-01-28: qty 2

## 2014-01-28 MED ORDER — EPINEPHRINE HCL 1 MG/ML IJ SOLN
INTRAMUSCULAR | Status: DC | PRN
  Administered 2014-01-28: 10:00:00

## 2014-01-28 MED ORDER — PHENYLEPHRINE HCL 2.5 % OP SOLN
1.0000 [drp] | OPHTHALMIC | Status: AC
  Administered 2014-01-28 (×3): 1 [drp] via OPHTHALMIC

## 2014-01-28 MED ORDER — PROVISC 10 MG/ML IO SOLN
INTRAOCULAR | Status: DC | PRN
  Administered 2014-01-28: 0.85 mL via INTRAOCULAR

## 2014-01-28 MED ORDER — KETOROLAC TROMETHAMINE 0.5 % OP SOLN
OPHTHALMIC | Status: AC
  Filled 2014-01-28: qty 5

## 2014-01-28 MED ORDER — MIDAZOLAM HCL 2 MG/2ML IJ SOLN
1.0000 mg | INTRAMUSCULAR | Status: DC | PRN
  Administered 2014-01-28 (×2): 2 mg via INTRAVENOUS
  Filled 2014-01-28: qty 2

## 2014-01-28 MED ORDER — FENTANYL CITRATE 0.05 MG/ML IJ SOLN
25.0000 ug | INTRAMUSCULAR | Status: AC
  Administered 2014-01-28: 25 ug via INTRAVENOUS

## 2014-01-28 MED ORDER — PHENYLEPHRINE HCL 2.5 % OP SOLN
OPHTHALMIC | Status: AC
  Filled 2014-01-28: qty 15

## 2014-01-28 MED ORDER — LACTATED RINGERS IV SOLN
INTRAVENOUS | Status: DC
  Administered 2014-01-28: 09:00:00 via INTRAVENOUS

## 2014-01-28 MED ORDER — BSS IO SOLN
INTRAOCULAR | Status: DC | PRN
Start: 2014-01-28 — End: 2014-01-28
  Administered 2014-01-28: 15 mL via INTRAOCULAR

## 2014-01-28 MED ORDER — CYCLOPENTOLATE-PHENYLEPHRINE 0.2-1 % OP SOLN
1.0000 [drp] | OPHTHALMIC | Status: AC
  Administered 2014-01-28 (×4): 1 [drp] via OPHTHALMIC

## 2014-01-28 SURGICAL SUPPLY — 26 items
CAPSULAR TENSION RING-AMO (OPHTHALMIC RELATED) IMPLANT
CLOTH BEACON ORANGE TIMEOUT ST (SAFETY) ×3 IMPLANT
EYE SHIELD UNIVERSAL CLEAR (GAUZE/BANDAGES/DRESSINGS) ×3 IMPLANT
GLOVE BIO SURGEON STRL SZ 6.5 (GLOVE) ×2 IMPLANT
GLOVE BIO SURGEONS STRL SZ 6.5 (GLOVE) ×1
GLOVE BIOGEL PI IND STRL 6.5 (GLOVE) ×1 IMPLANT
GLOVE BIOGEL PI INDICATOR 6.5 (GLOVE) ×2
GLOVE ECLIPSE 6.5 STRL STRAW (GLOVE) IMPLANT
GLOVE ECLIPSE 7.0 STRL STRAW (GLOVE) IMPLANT
GLOVE EXAM NITRILE LRG STRL (GLOVE) IMPLANT
GLOVE EXAM NITRILE MD LF STRL (GLOVE) IMPLANT
GLOVE SKINSENSE NS SZ6.5 (GLOVE)
GLOVE SKINSENSE STRL SZ6.5 (GLOVE) IMPLANT
HEALON 5 0.6 ML (INTRAOCULAR LENS) IMPLANT
KIT VITRECTOMY (OPHTHALMIC RELATED) IMPLANT
PAD ARMBOARD 7.5X6 YLW CONV (MISCELLANEOUS) ×3 IMPLANT
PROC W NO LENS (INTRAOCULAR LENS)
PROC W SPEC LENS (INTRAOCULAR LENS)
PROCESS W NO LENS (INTRAOCULAR LENS) IMPLANT
PROCESS W SPEC LENS (INTRAOCULAR LENS) IMPLANT
RING MALYGIN (MISCELLANEOUS) ×3 IMPLANT
SIGHTPATH CAT PROC W REG LENS (Ophthalmic Related) ×3 IMPLANT
TAPE SURG TRANSPORE 1 IN (GAUZE/BANDAGES/DRESSINGS) ×1 IMPLANT
TAPE SURGICAL TRANSPORE 1 IN (GAUZE/BANDAGES/DRESSINGS) ×2
VISCOELASTIC ADDITIONAL (OPHTHALMIC RELATED) IMPLANT
WATER STERILE IRR 250ML POUR (IV SOLUTION) ×3 IMPLANT

## 2014-01-28 NOTE — Anesthesia Procedure Notes (Signed)
Procedure Name: MAC Date/Time: 01/28/2014 10:01 AM Performed by: Vista Deck Pre-anesthesia Checklist: Patient identified, Emergency Drugs available, Suction available, Timeout performed and Patient being monitored Patient Re-evaluated:Patient Re-evaluated prior to inductionOxygen Delivery Method: Nasal Cannula

## 2014-01-28 NOTE — Op Note (Signed)
Patient brought to the operating room and prepped and draped in the usual manner.  Lid speculum inserted in left eye.  Stab incision made at the twelve o'clock position.  Provisc instilled in the anterior chamber.   A 2.4 mm. Stab incision was made temporally.  Due to a small pupil, a Malugyn Ring was inserted.  An anterior capsulotomy was done with a bent 25 gauge needle.  The nucleus was hydrodissected.  The Phaco tip was inserted in the anterior chamber and the nucleus was emulsified.  CDE was 16.74.  The cortical material was then removed with the I and A tip.  Posterior capsule was the polished.  The anterior chamber was deepened with Provisc.  A 22.0 [place] was then inserted in the capsular bag.  The Malugyn Ring was removed.  Provisc was then removed with the I and A tip.  The wound was then hydrated.  Patient sent to the Recovery Room in good condition with follow up in my office.  Preoperative Diagnosis:  Nuclear Cataract OS Postoperative Diagnosis:  Same Procedure name: Kelman Phacoemulsification OS with IOL

## 2014-01-28 NOTE — Addendum Note (Signed)
Addendum created 01/28/14 1050 by Vista Deck, CRNA   Modules edited: Charges VN

## 2014-01-28 NOTE — Transfer of Care (Signed)
Immediate Anesthesia Transfer of Care Note  Patient: David Acosta  Procedure(s) Performed: Procedure(s) (LRB): CATARACT EXTRACTION PHACO AND INTRAOCULAR LENS PLACEMENT (IOC) (Left)  Patient Location: Shortstay  Anesthesia Type: MAC  Level of Consciousness: awake  Airway & Oxygen Therapy: Patient Spontanous Breathing   Post-op Assessment: Report given to PACU RN, Post -op Vital signs reviewed and stable and Patient moving all extremities  Post vital signs: Reviewed and stable  Complications: No apparent anesthesia complications

## 2014-01-28 NOTE — Discharge Instructions (Signed)
Cataract Surgery °Care After °Refer to this sheet in the next few weeks. These instructions provide you with information on caring for yourself after your procedure. Your caregiver may also give you more specific instructions. Your treatment has been planned according to current medical practices, but problems sometimes occur. Call your caregiver if you have any problems or questions after your procedure.  °HOME CARE INSTRUCTIONS  °· Avoid strenuous activities as directed by your caregiver. °· Ask your caregiver when you can resume driving. °· Use eyedrops or other medicines to help healing and control pressure inside your eye as directed by your caregiver. °· Only take over-the-counter or prescription medicines for pain, discomfort, or fever as directed by your caregiver. °· Do not to touch or rub your eyes. °· You may be instructed to use a protective shield during the first few days and nights after surgery. If not, wear sunglasses to protect your eyes. This is to protect the eye from pressure or from being accidentally bumped. °· Keep the area around your eye clean and dry. Avoid swimming or allowing water to hit you directly in the face while showering. Keep soap and shampoo out of your eyes. °· Do not bend or lift heavy objects. Bending increases pressure in the eye. You can walk, climb stairs, and do light household chores. °· Do not put a contact lens into the eye that had surgery until your caregiver says it is okay to do so. °· Ask your doctor when you can return to work. This will depend on the kind of work that you do. If you work in a dusty environment, you may be advised to wear protective eyewear for a period of time. °· Ask your caregiver when it will be safe to engage in sexual activity. °· Continue with your regular eye exams as directed by your caregiver. °What to expect: °· It is normal to feel itching and mild discomfort for a few days after cataract surgery. Some fluid discharge is also common,  and your eye may be sensitive to light and touch. °· After 1 to 2 days, even moderate discomfort should disappear. In most cases, healing will take about 6 weeks. °· If you received an intraocular lens (IOL), you may notice that colors are very bright or have a blue tinge. Also, if you have been in bright sunlight, everything may appear reddish for a few hours. If you see these color tinges, it is because your lens is clear and no longer cloudy. Within a few months after receiving an IOL, these extra colors should go away. When you have healed, you will probably need new glasses. °SEEK MEDICAL CARE IF:  °· You have increased bruising around your eye. °· You have discomfort not helped by medicine. °SEEK IMMEDIATE MEDICAL CARE IF:  °· You have a  fever. °· You have a worsening or sudden vision loss. °· You have redness, swelling, or increasing pain in the eye. °· You have a thick discharge from the eye that had surgery. °MAKE SURE YOU: °· Understand these instructions. °· Will watch your condition. °· Will get help right away if you are not doing well or get worse. °Document Released: 02/11/2005 Document Revised: 10/17/2011 Document Reviewed: 03/18/2011 °[employer]® Patient Information ©2015 [employer]. This information is not intended to replace advice given to you by your health care provider. Make sure you discuss any questions you have with your health care provider. ° °

## 2014-01-28 NOTE — H&P (Signed)
The patient was re examined and there is no change in the patients condition since the original H and P. 

## 2014-01-28 NOTE — Anesthesia Postprocedure Evaluation (Signed)
  Anesthesia Post-op Note  Patient: David Acosta  Procedure(s) Performed: Procedure(s) (LRB): CATARACT EXTRACTION PHACO AND INTRAOCULAR LENS PLACEMENT (IOC) (Left)  Patient Location:  Short Stay  Anesthesia Type: MAC  Level of Consciousness: awake  Airway and Oxygen Therapy: Patient Spontanous Breathing  Post-op Pain: none  Post-op Assessment: Post-op Vital signs reviewed, Patient's Cardiovascular Status Stable, Respiratory Function Stable, Patent Airway, No signs of Nausea or vomiting and Pain level controlled  Post-op Vital Signs: Reviewed and stable  Complications: No apparent anesthesia complications

## 2014-01-28 NOTE — Anesthesia Preprocedure Evaluation (Signed)
Anesthesia Evaluation  Patient identified by MRN, date of birth, ID band Patient awake    Reviewed: Allergy & Precautions, H&P , NPO status , Patient's Chart, lab work & pertinent test results  Airway Mallampati: II TM Distance: >3 FB Neck ROM: Full    Dental  (+) Teeth Intact   Pulmonary former smoker,  breath sounds clear to auscultation        Cardiovascular hypertension, Pt. on medications Rhythm:Regular Rate:Normal     Neuro/Psych    GI/Hepatic   Endo/Other    Renal/GU      Musculoskeletal   Abdominal   Peds  Hematology   Anesthesia Other Findings   Reproductive/Obstetrics                           Anesthesia Physical Anesthesia Plan  ASA: III  Anesthesia Plan: MAC   Post-op Pain Management:    Induction: Intravenous  Airway Management Planned: Nasal Cannula  Additional Equipment:   Intra-op Plan:   Post-operative Plan:   Informed Consent: I have reviewed the patients History and Physical, chart, labs and discussed the procedure including the risks, benefits and alternatives for the proposed anesthesia with the patient or authorized representative who has indicated his/her understanding and acceptance.     Plan Discussed with:   Anesthesia Plan Comments:         Anesthesia Quick Evaluation

## 2014-01-29 ENCOUNTER — Encounter (HOSPITAL_COMMUNITY): Payer: Self-pay | Admitting: Ophthalmology

## 2014-02-03 ENCOUNTER — Encounter (HOSPITAL_COMMUNITY): Payer: Self-pay | Admitting: Pharmacy Technician

## 2014-02-12 ENCOUNTER — Encounter (HOSPITAL_COMMUNITY): Payer: Self-pay

## 2014-02-12 ENCOUNTER — Encounter (HOSPITAL_COMMUNITY)
Admission: RE | Admit: 2014-02-12 | Discharge: 2014-02-12 | Disposition: A | Discharge status: Home / Self Care | Payer: Medicare Other | Source: Ambulatory Visit | Attending: Ophthalmology | Admitting: Ophthalmology

## 2014-02-12 MED ORDER — FENTANYL CITRATE 0.05 MG/ML IJ SOLN
25.0000 ug | INTRAMUSCULAR | Status: DC | PRN
Start: 2014-02-12 — End: 2014-02-13

## 2014-02-12 MED ORDER — ONDANSETRON HCL 4 MG/2ML IJ SOLN: 4.0000 mg | Freq: Once | INTRAMUSCULAR | Status: AC | PRN

## 2014-02-12 NOTE — Patient Instructions (Addendum)
Your procedure is scheduled on:  02/18/14  Report to [facility] at   8:30   AM.  Call this number if you have problems the morning of surgery: [phone]   Remember:   Do not eat or drink :After Midnight.    Take these medicines the morning of surgery with A SIP OF WATER: Diltiazem   Do not wear jewelry, make-up or nail polish.  Do not wear lotions, powders, or perfumes. You may wear deodorant.  Do not shave 48 hours prior to surgery.  Do not bring valuables to the hospital.  Contacts, dentures or bridgework may not be worn into surgery.  Patients discharged the day of surgery will not be allowed to drive home.  Name and phone number of your driver:    Please read over the following fact sheets that you were given: Pain Booklet, Surgical Site Infection Prevention, Anesthesia Post-op Instructions and Care and Recovery After Surgery  Cataract Surgery  A cataract is a clouding of the lens of the eye. When a lens becomes cloudy, vision is reduced based on the degree and nature of the clouding. Surgery may be needed to improve vision. Surgery removes the cloudy lens and usually replaces it with a substitute lens (intraocular lens, IOL). LET YOUR EYE DOCTOR KNOW ABOUT:  Allergies to food or medicine.   Medicines taken including herbs, eyedrops, over-the-counter medicines, and creams.   Use of steroids (by mouth or creams).   Previous problems with anesthetics or numbing medicine.   History of bleeding problems or blood clots.   Previous surgery.   Other health problems, including diabetes and kidney problems.   Possibility of pregnancy, if this applies.  RISKS AND COMPLICATIONS  Infection.   Inflammation of the eyeball (endophthalmitis) that can spread to both eyes (sympathetic ophthalmia).   Poor wound healing.   If an IOL is inserted, it can later fall out of proper position. This is very uncommon.   Clouding of the part of your eye that holds an IOL in place. This is  called an "after-cataract." These are uncommon, but easily treated.  BEFORE THE PROCEDURE  Do not eat or drink anything except small amounts of water for 8 to 12 before your surgery, or as directed by your caregiver.   Unless you are told otherwise, continue any eyedrops you have been prescribed.   Talk to your primary caregiver about all other medicines that you take (both prescription and non-prescription). In some cases, you may need to stop or change medicines near the time of your surgery. This is most important if you are taking blood-thinning medicine.Do not stop medicines unless you are told to do so.   Arrange for someone to drive you to and from the procedure.   Do not put contact lenses in either eye on the day of your surgery.  PROCEDURE There is more than one method for safely removing a cataract. Your doctor can explain the differences and help determine which is best for you. Phacoemulsification surgery is the most common form of cataract surgery.  An injection is given behind the eye or eyedrops are given to make this a painless procedure.   A small cut (incision) is made on the edge of the clear, dome-shaped surface that covers the front of the eye (cornea).   A tiny probe is painlessly inserted into the eye. This device gives off ultrasound waves that soften and break up the cloudy center of the lens. This makes it easier for  the cloudy lens to be removed by suction.   An IOL may be implanted.   The normal lens of the eye is covered by a clear capsule. Part of that capsule is intentionally left in the eye to support the IOL.   Your surgeon may or may not use stitches to close the incision.  There are other forms of cataract surgery that require a larger incision and stiches to close the eye. This approach is taken in cases where the doctor feels that the cataract cannot be easily removed using phacoemulsification. AFTER THE PROCEDURE  When an IOL is implanted, it  does not need care. It becomes a permanent part of your eye and cannot be seen or felt.   Your doctor will schedule follow-up exams to check on your progress.   Review your other medicines with your doctor to see which can be resumed after surgery.   Use eyedrops or take medicine as prescribed by your doctor.  Document Released: 07/14/2011 Document Reviewed: 07/11/2011 [facility] Patient Information 2012 [place].  .Cataract Surgery Care After Refer to this sheet in the next few weeks. These instructions provide you with information on caring for yourself after your procedure. Your caregiver may also give you more specific instructions. Your treatment has been planned according to current medical practices, but problems sometimes occur. Call your caregiver if you have any problems or questions after your procedure.  HOME CARE INSTRUCTIONS   Avoid strenuous activities as directed by your caregiver.   Ask your caregiver when you can resume driving.   Use eyedrops or other medicines to help healing and control pressure inside your eye as directed by your caregiver.   Only take over-the-counter or prescription medicines for pain, discomfort, or fever as directed by your caregiver.   Do not to touch or rub your eyes.   You may be instructed to use a protective shield during the first few days and nights after surgery. If not, wear sunglasses to protect your eyes. This is to protect the eye from pressure or from being accidentally bumped.   Keep the area around your eye clean and dry. Avoid swimming or allowing water to hit you directly in the face while showering. Keep soap and shampoo out of your eyes.   Do not bend or lift heavy objects. Bending increases pressure in the eye. You can walk, climb stairs, and do light household chores.   Do not put a contact lens into the eye that had surgery until your caregiver says it is okay to do so.   Ask your doctor when you can return to  work. This will depend on the kind of work that you do. If you work in a dusty environment, you may be advised to wear protective eyewear for a period of time.   Ask your caregiver when it will be safe to engage in sexual activity.   Continue with your regular eye exams as directed by your caregiver.  What to expect:  It is normal to feel itching and mild discomfort for a few days after cataract surgery. Some fluid discharge is also common, and your eye may be sensitive to light and touch.   After 1 to 2 days, even moderate discomfort should disappear. In most cases, healing will take about 6 weeks.   If you received an intraocular lens (IOL), you may notice that colors are very bright or have a blue tinge. Also, if you have been in bright sunlight, everything may appear  reddish for a few hours. If you see these color tinges, it is because your lens is clear and no longer cloudy. Within a few months after receiving an IOL, these extra colors should go away. When you have healed, you will probably need new glasses.  SEEK MEDICAL CARE IF:   You have increased bruising around your eye.   You have discomfort not helped by medicine.  SEEK IMMEDIATE MEDICAL CARE IF:   You have a fever.   You have a worsening or sudden vision loss.   You have redness, swelling, or increasing pain in the eye.   You have a thick discharge from the eye that had surgery.  MAKE SURE YOU:  Understand these instructions.   Will watch your condition.   Will get help right away if you are not doing well or get worse.  Document Released: 02/11/2005 Document Revised: 07/14/2011 Document Reviewed: 03/18/2011 [facility] Patient Information 2012 [place].    Monitored Anesthesia Care  Monitored anesthesia care is an anesthesia service for a medical procedure. Anesthesia is the loss of the ability to feel pain. It is produced by medications called anesthetics. It may affect a small area of your body (local  anesthesia), a large area of your body (regional anesthesia), or your entire body (general anesthesia). The need for monitored anesthesia care depends your procedure, your condition, and the potential need for regional or general anesthesia. It is often provided during procedures where:   General anesthesia may be needed if there are complications. This is because you need special care when you are under general anesthesia.   You will be under local or regional anesthesia. This is so that you are able to have higher levels of anesthesia if needed.   You will receive calming medications (sedatives). This is especially the case if sedatives are given to put you in a semi-conscious state of relaxation (deep sedation). This is because the amount of sedative needed to produce this state can be hard to predict. Too much of a sedative can produce general anesthesia. Monitored anesthesia care is performed by one or more caregivers who have special training in all types of anesthesia. You will need to meet with these caregivers before your procedure. During this meeting, they will ask you about your medical history. They will also give you instructions to follow. (For example, you will need to stop eating and drinking before your procedure. You may also need to stop or change medications you are taking.) During your procedure, your caregivers will stay with you. They will:   Watch your condition. This includes watching you blood pressure, breathing, and level of pain.   Diagnose and treat problems that occur.   Give medications if they are needed. These may include calming medications (sedatives) and anesthetics.   Make sure you are comfortable.  Having monitored anesthesia care does not necessarily mean that you will be under anesthesia. It does mean that your caregivers will be able to manage anesthesia if you need it or if it occurs. It also means that you will be able to have a different type of  anesthesia than you are having if you need it. When your procedure is complete, your caregivers will continue to watch your condition. They will make sure any medications wear off before you are allowed to go home.  Document Released: 04/20/2005 Document Revised: 11/19/2012 Document Reviewed: 09/05/2012 [facility] Patient Information 2014 [place], [place].

## 2014-02-17 MED ORDER — PHENYLEPHRINE HCL 2.5 % OP SOLN
OPHTHALMIC | Status: AC
  Filled 2014-02-17: qty 15

## 2014-02-17 MED ORDER — TETRACAINE HCL 0.5 % OP SOLN
OPHTHALMIC | Status: AC
  Filled 2014-02-17: qty 2

## 2014-02-17 MED ORDER — CYCLOPENTOLATE-PHENYLEPHRINE OP SOLN OPTIME - NO CHARGE
OPHTHALMIC | Status: AC
  Filled 2014-02-17: qty 2

## 2014-02-17 MED ORDER — KETOROLAC TROMETHAMINE 0.5 % OP SOLN
OPHTHALMIC | Status: AC
  Filled 2014-02-17: qty 5

## 2014-02-18 ENCOUNTER — Ambulatory Visit (HOSPITAL_COMMUNITY): Payer: Medicare Other | Admitting: Anesthesiology

## 2014-02-18 ENCOUNTER — Ambulatory Visit (HOSPITAL_COMMUNITY)
Admission: RE | Admit: 2014-02-18 | Discharge: 2014-02-18 | Disposition: A | Discharge status: Home / Self Care | Payer: Medicare Other | Source: Ambulatory Visit | Attending: Ophthalmology | Admitting: Ophthalmology

## 2014-02-18 ENCOUNTER — Encounter (HOSPITAL_COMMUNITY): Payer: Self-pay | Admitting: *Deleted

## 2014-02-18 ENCOUNTER — Encounter (HOSPITAL_COMMUNITY): Payer: Medicare Other | Admitting: Anesthesiology

## 2014-02-18 ENCOUNTER — Encounter (HOSPITAL_COMMUNITY)
Admission: RE | Disposition: A | Discharge status: Home / Self Care | Payer: Self-pay | Source: Ambulatory Visit | Attending: Ophthalmology

## 2014-02-18 DIAGNOSIS — Z79899 Other long term (current) drug therapy: Secondary | ICD-10-CM | POA: Insufficient documentation

## 2014-02-18 DIAGNOSIS — Z7982 Long term (current) use of aspirin: Secondary | ICD-10-CM | POA: Insufficient documentation

## 2014-02-18 DIAGNOSIS — H2510 Age-related nuclear cataract, unspecified eye: Secondary | ICD-10-CM | POA: Insufficient documentation

## 2014-02-18 DIAGNOSIS — I10 Essential (primary) hypertension: Secondary | ICD-10-CM | POA: Insufficient documentation

## 2014-02-18 DIAGNOSIS — Z87891 Personal history of nicotine dependence: Secondary | ICD-10-CM | POA: Insufficient documentation

## 2014-02-18 HISTORY — PX: CATARACT EXTRACTION W/PHACO: SHX586

## 2014-02-18 SURGERY — PHACOEMULSIFICATION, CATARACT, WITH IOL INSERTION
Anesthesia: Monitor Anesthesia Care | Site: Eye | Laterality: Right

## 2014-02-18 MED ORDER — BSS IO SOLN
INTRAOCULAR | Status: DC | PRN
  Administered 2014-02-18: 15 mL via INTRAOCULAR

## 2014-02-18 MED ORDER — MIDAZOLAM HCL 2 MG/2ML IJ SOLN
1.0000 mg | INTRAMUSCULAR | Status: DC | PRN
  Administered 2014-02-18: 2 mg via INTRAVENOUS
  Filled 2014-02-18: qty 2

## 2014-02-18 MED ORDER — PHENYLEPHRINE HCL 2.5 % OP SOLN
1.0000 [drp] | OPHTHALMIC | Status: AC
  Administered 2014-02-18 (×3): 1 [drp] via OPHTHALMIC

## 2014-02-18 MED ORDER — TETRACAINE 0.5 % OP SOLN OPTIME - NO CHARGE
OPHTHALMIC | Status: DC | PRN
  Administered 2014-02-18: 2 [drp] via OPHTHALMIC

## 2014-02-18 MED ORDER — ONDANSETRON HCL 4 MG/2ML IJ SOLN: 4.0000 mg | Freq: Once | INTRAMUSCULAR | Status: DC | PRN

## 2014-02-18 MED ORDER — PROVISC 10 MG/ML IO SOLN
INTRAOCULAR | Status: DC | PRN
  Administered 2014-02-18: 0.85 mL via INTRAOCULAR

## 2014-02-18 MED ORDER — EPINEPHRINE HCL 1 MG/ML IJ SOLN
INTRAMUSCULAR | Status: AC
  Filled 2014-02-18: qty 1

## 2014-02-18 MED ORDER — FENTANYL CITRATE 0.05 MG/ML IJ SOLN
25.0000 ug | INTRAMUSCULAR | Status: AC
  Administered 2014-02-18 (×2): 25 ug via INTRAVENOUS
  Filled 2014-02-18: qty 2

## 2014-02-18 MED ORDER — TETRACAINE HCL 0.5 % OP SOLN
1.0000 [drp] | OPHTHALMIC | Status: AC
  Administered 2014-02-18 (×3): 1 [drp] via OPHTHALMIC

## 2014-02-18 MED ORDER — EPINEPHRINE HCL 1 MG/ML IJ SOLN
INTRAMUSCULAR | Status: DC | PRN
  Administered 2014-02-18: 10:00:00

## 2014-02-18 MED ORDER — LACTATED RINGERS IV SOLN
INTRAVENOUS | Status: DC
  Administered 2014-02-18: 09:00:00 via INTRAVENOUS

## 2014-02-18 MED ORDER — FENTANYL CITRATE 0.05 MG/ML IJ SOLN: 25.0000 ug | INTRAMUSCULAR | Status: DC | PRN

## 2014-02-18 MED ORDER — CYCLOPENTOLATE-PHENYLEPHRINE 0.2-1 % OP SOLN
1.0000 [drp] | OPHTHALMIC | Status: AC
  Administered 2014-02-18 (×3): 1 [drp] via OPHTHALMIC

## 2014-02-18 MED ORDER — KETOROLAC TROMETHAMINE 0.5 % OP SOLN
1.0000 [drp] | OPHTHALMIC | Status: AC
  Administered 2014-02-18 (×3): 1 [drp] via OPHTHALMIC

## 2014-02-18 SURGICAL SUPPLY — 26 items
CAPSULAR TENSION RING-AMO (OPHTHALMIC RELATED) IMPLANT
CLOTH BEACON ORANGE TIMEOUT ST (SAFETY) ×3 IMPLANT
EYE SHIELD UNIVERSAL CLEAR (GAUZE/BANDAGES/DRESSINGS) ×3 IMPLANT
GLOVE BIO SURGEON STRL SZ 6.5 (GLOVE) IMPLANT
GLOVE BIO SURGEONS STRL SZ 6.5 (GLOVE)
GLOVE BIOGEL PI IND STRL 7.0 (GLOVE) ×1 IMPLANT
GLOVE BIOGEL PI INDICATOR 7.0 (GLOVE) ×2
GLOVE ECLIPSE 6.5 STRL STRAW (GLOVE) ×3 IMPLANT
GLOVE ECLIPSE 7.0 STRL STRAW (GLOVE) IMPLANT
GLOVE EXAM NITRILE LRG STRL (GLOVE) IMPLANT
GLOVE EXAM NITRILE MD LF STRL (GLOVE) IMPLANT
GLOVE SKINSENSE NS SZ6.5 (GLOVE)
GLOVE SKINSENSE STRL SZ6.5 (GLOVE) IMPLANT
HEALON 5 0.6 ML (INTRAOCULAR LENS) IMPLANT
KIT VITRECTOMY (OPHTHALMIC RELATED) IMPLANT
PAD ARMBOARD 7.5X6 YLW CONV (MISCELLANEOUS) ×3 IMPLANT
PROC W NO LENS (INTRAOCULAR LENS)
PROC W SPEC LENS (INTRAOCULAR LENS)
PROCESS W NO LENS (INTRAOCULAR LENS) IMPLANT
PROCESS W SPEC LENS (INTRAOCULAR LENS) IMPLANT
RING MALYGIN (MISCELLANEOUS) IMPLANT
SIGHTPATH CAT PROC W REG LENS (Ophthalmic Related) ×3 IMPLANT
TAPE SURG TRANSPORE 1 IN (GAUZE/BANDAGES/DRESSINGS) ×1 IMPLANT
TAPE SURGICAL TRANSPORE 1 IN (GAUZE/BANDAGES/DRESSINGS) ×2
VISCOELASTIC ADDITIONAL (OPHTHALMIC RELATED) IMPLANT
WATER STERILE IRR 250ML POUR (IV SOLUTION) ×3 IMPLANT

## 2014-02-18 NOTE — Anesthesia Postprocedure Evaluation (Signed)
  Anesthesia Post-op Note  Patient: David Acosta  Procedure(s) Performed: Procedure(s) (LRB): CATARACT EXTRACTION PHACO AND INTRAOCULAR LENS PLACEMENT (IOC) (Right)  Patient Location:  Short Stay  Anesthesia Type: MAC  Level of Consciousness: awake  Airway and Oxygen Therapy: Patient Spontanous Breathing  Post-op Pain: none  Post-op Assessment: Post-op Vital signs reviewed, Patient's Cardiovascular Status Stable, Respiratory Function Stable, Patent Airway, No signs of Nausea or vomiting and Pain level controlled  Post-op Vital Signs: Reviewed and stable  Complications: No apparent anesthesia complications

## 2014-02-18 NOTE — Op Note (Signed)
Patient brought to the operating room and prepped and draped in the usual manner.  Lid speculum inserted in right eye.  Stab incision made at the twelve o'clock position.  Provisc instilled in the anterior chamber.   A 2.4 mm. Stab incision was made temporally.  An anterior capsulotomy was done with a bent 25 gauge needle.  The nucleus was hydrodissected.  The Phaco tip was inserted in the anterior chamber and the nucleus was emulsified.  CDE was 9.15.  The cortical material was then removed with the I and A tip.  Posterior capsule was the polished.  The anterior chamber was deepened with Provisc.  A 21.5 Diopter Rayner 570C IOL was then inserted in the capsular bag.  Provisc was then removed with the I and A tip.  The wound was then hydrated.  Patient sent to the Recovery Room in good condition with follow up in my office.  Preoperative Diagnosis:  Nuclear Cataract OD Postoperative Diagnosis:  Same Procedure name: [place]man Phacoemulsification OD with IOL

## 2014-02-18 NOTE — Anesthesia Procedure Notes (Signed)
Procedure Name: MAC Date/Time: 02/18/2014 9:40 AM Performed by: Vista Deck Pre-anesthesia Checklist: Patient identified, Emergency Drugs available, Suction available, Timeout performed and Patient being monitored Patient Re-evaluated:Patient Re-evaluated prior to inductionOxygen Delivery Method: Nasal Cannula

## 2014-02-18 NOTE — Anesthesia Preprocedure Evaluation (Signed)
Anesthesia Evaluation  Patient identified by MRN, date of birth, ID band Patient awake    Reviewed: Allergy & Precautions, H&P , NPO status , Patient's Chart, lab work & pertinent test results  Airway Mallampati: II TM Distance: >3 FB Neck ROM: Full    Dental  (+) Teeth Intact   Pulmonary former smoker,  breath sounds clear to auscultation        Cardiovascular hypertension, Pt. on medications Rhythm:Regular Rate:Normal     Neuro/Psych    GI/Hepatic   Endo/Other    Renal/GU      Musculoskeletal   Abdominal   Peds  Hematology   Anesthesia Other Findings   Reproductive/Obstetrics                           Anesthesia Physical Anesthesia Plan  ASA: III  Anesthesia Plan: MAC   Post-op Pain Management:    Induction: Intravenous  Airway Management Planned: Nasal Cannula  Additional Equipment:   Intra-op Plan:   Post-operative Plan:   Informed Consent: I have reviewed the patients History and Physical, chart, labs and discussed the procedure including the risks, benefits and alternatives for the proposed anesthesia with the patient or authorized representative who has indicated his/her understanding and acceptance.     Plan Discussed with:   Anesthesia Plan Comments:         Anesthesia Quick Evaluation

## 2014-02-18 NOTE — Transfer of Care (Signed)
Immediate Anesthesia Transfer of Care Note  Patient: David Acosta  Procedure(s) Performed: Procedure(s) (LRB): CATARACT EXTRACTION PHACO AND INTRAOCULAR LENS PLACEMENT (IOC) (Right)  Patient Location: Shortstay  Anesthesia Type: MAC  Level of Consciousness: awake  Airway & Oxygen Therapy: Patient Spontanous Breathing   Post-op Assessment: Report given to PACU RN, Post -op Vital signs reviewed and stable and Patient moving all extremities  Post vital signs: Reviewed and stable  Complications: No apparent anesthesia complications

## 2014-02-18 NOTE — Discharge Instructions (Signed)
JEREL SARDINA  02/18/2014           [place] Instructions [place] [address]      1. Avoid closing eyes tightly. One often closes the eye tightly when laughing, talking, sneezing, coughing or if they feel irritated. At these times, you should be careful not to close your eyes tightly.  2. Instill eye drops as instructed. To instill drops in your eye, open it, look up and have someone gently pull the lower lid down and instill a couple of drops inside the lower lid.  3. Do not touch upper lid.  4. Take Advil or Tylenol for pain.  5. You may use either eye for near work, such as reading or sewing and you may watch television.  6. You may have your hair done at the beauty parlor at any time.  7. Wear dark glasses with or without your own glasses if you are in bright light.  8. Call our office at [phone] or [phone] if you have sharp pain in your eye or unusual symptoms.  9. Do not be concerned because vision in the operative eye is not good. It will not be good, no matter how successful the operation, until you get a special lens for it. Your old glasses will not be suited to the new eye that was operated on and you will not be ready for a new lens for about a month.  10. Follow up at the [facility] office.    I have received a copy of the above instructions and will follow them.

## 2014-02-18 NOTE — H&P (Signed)
The patient was re examined and there is no change in the patients condition since the original H and P. 

## 2014-02-19 ENCOUNTER — Encounter (HOSPITAL_COMMUNITY): Payer: Self-pay | Admitting: Ophthalmology

## 2014-12-01 NOTE — Progress Notes (Signed)
Patient ID: David Acosta, male   DOB: 1926-02-21, 81 y.o.   MRN: [id number] 81 y.o.  seen in ER early June 2014  for palpitations and found to have PVC;s. Chronic since early 70;s Previous cath in 2002 normal. No syncope chest pain and only mild exertional dyspnea   ER evaluaition normal Not tolerant to beta blockers does better on calcium blockers like Calan. Feels skips but not usually persistant rapid tachycardia. Symptoms not worse with exercise Walked a mile yesterday with no issues. No excess caffienne or ETOH   2014  F/U ETT normal  Holter PVC;s only 3% of beats  Echo EF 60-65%   There is a question of beta blocker intolerance. Was on them in the 70;s Had short couple of day trial of metoprolol afte rER visit and thought palpitations were worse but not a good trial Subsequently placed on verapamil and is asymptomatic   Recent cataract removal Dr Gershon Crane  Has dry macular and vision very poor now   ROS: Denies fever, malais, weight loss, blurry vision, decreased visual acuity, cough, sputum, SOB, hemoptysis, pleuritic pain, palpitaitons, heartburn, abdominal pain, melena, lower extremity edema, claudication, or rash.  All other systems reviewed and negative  General: Affect appropriate Overweight elderly male  HEENT: normal Neck supple with no adenopathy JVP normal no bruits no thyromegaly Lungs clear with no wheezing and good diaphragmatic motion Heart:  S1/S2 no murmur, no rub, gallop or click PMI normal Abdomen: benighn, BS positve, no tenderness, no AAA ventral hernia no bruit.  No HSM or HJR Distal pulses intact with no bruits No edema Neuro non-focal Skin warm and dry No muscular weakness   Current Outpatient Prescriptions  Medication Sig Dispense Refill  . aspirin EC 81 MG tablet Take 81 mg by mouth daily.    . brinzolamide (AZOPT) 1 % ophthalmic suspension Place 1 drop into the left eye 3 (three) times daily.    . Bromfenac Sodium (PROLENSA) 0.07 % SOLN Place 1  drop into the left eye daily.    Marland Kitchen diltiazem (CARDIZEM CD) 240 MG 24 hr capsule Take 1 capsule by mouth daily.     . hydrochlorothiazide (HYDRODIURIL) 25 MG tablet Take 25 mg by mouth daily.    . Multiple Vitamins-Minerals (ICAPS AREDS FORMULA PO) Take 1 tablet by mouth 2 (two) times daily.    Marland Kitchen ofloxacin (OCUFLOX) 0.3 % ophthalmic solution Place 1 drop into the left eye 2 (two) times daily.     . TRAVATAN Z 0.004 % SOLN ophthalmic solution Place 1 drop into both eyes at bedtime.      No current facility-administered medications for this visit.    Allergies  Capsaicin; Procardia xl; and Timolol  Electrocardiogram:  SR PVC;s low voltage  6/14  12/02/14  SR rate 79  Low voltage nonspecific ST changes   Assessment and Plan

## 2014-12-02 ENCOUNTER — Ambulatory Visit (INDEPENDENT_AMBULATORY_CARE_PROVIDER_SITE_OTHER): Payer: Medicare Other | Admitting: Cardiovascular Disease

## 2014-12-02 ENCOUNTER — Encounter: Payer: Self-pay | Admitting: Cardiovascular Disease

## 2014-12-02 VITALS — BP 128/66 | HR 79 | Ht 70.0 in | Wt 196.4 lb

## 2014-12-02 DIAGNOSIS — I493 Ventricular premature depolarization: Secondary | ICD-10-CM

## 2014-12-02 DIAGNOSIS — I10 Essential (primary) hypertension: Secondary | ICD-10-CM | POA: Diagnosis not present

## 2014-12-02 NOTE — Patient Instructions (Signed)
Medication Instructions:  NO CHANGES Your physician recommends that you continue on your current medications as directed. Please refer to the Current Medication list given to you today.  Labwork: NONE  Testing/Procedures: NONE  Follow-Up: Your physician wants you to follow-up in: [place] will receive a reminder letter in the mail two months in advance. If you don't receive a letter, please call our office to schedule the follow-up appointment.  Any Other Special Instructions Will Be Listed Below (If Applicable).

## 2014-12-02 NOTE — Assessment & Plan Note (Signed)
Asymptomatic chronic continue calcium blocker

## 2014-12-02 NOTE — Assessment & Plan Note (Signed)
Well controlled.  Continue current medications and low sodium Dash type diet.    

## 2016-01-01 ENCOUNTER — Ambulatory Visit: Payer: Medicare Other | Admitting: Cardiovascular Disease

## 2016-03-22 ENCOUNTER — Ambulatory Visit: Payer: Medicare Other | Admitting: Cardiovascular Disease

## 2016-03-31 ENCOUNTER — Encounter (HOSPITAL_COMMUNITY): Payer: Self-pay | Admitting: *Deleted

## 2016-03-31 ENCOUNTER — Emergency Department (HOSPITAL_COMMUNITY)
Admission: EM | Admit: 2016-03-31 | Discharge: 2016-03-31 | Disposition: A | Discharge status: Home / Self Care | Payer: Medicare Other | Attending: Emergency Medicine | Admitting: Emergency Medicine

## 2016-03-31 ENCOUNTER — Emergency Department (HOSPITAL_COMMUNITY): Payer: Medicare Other

## 2016-03-31 DIAGNOSIS — Z7982 Long term (current) use of aspirin: Secondary | ICD-10-CM | POA: Diagnosis not present

## 2016-03-31 DIAGNOSIS — Y999 Unspecified external cause status: Secondary | ICD-10-CM | POA: Insufficient documentation

## 2016-03-31 DIAGNOSIS — S8992XA Unspecified injury of left lower leg, initial encounter: Secondary | ICD-10-CM | POA: Diagnosis present

## 2016-03-31 DIAGNOSIS — S81812A Laceration without foreign body, left lower leg, initial encounter: Secondary | ICD-10-CM | POA: Insufficient documentation

## 2016-03-31 DIAGNOSIS — Z79899 Other long term (current) drug therapy: Secondary | ICD-10-CM | POA: Diagnosis not present

## 2016-03-31 DIAGNOSIS — Y939 Activity, unspecified: Secondary | ICD-10-CM | POA: Insufficient documentation

## 2016-03-31 DIAGNOSIS — Z87891 Personal history of nicotine dependence: Secondary | ICD-10-CM | POA: Diagnosis not present

## 2016-03-31 DIAGNOSIS — Z23 Encounter for immunization: Secondary | ICD-10-CM | POA: Diagnosis not present

## 2016-03-31 DIAGNOSIS — Y92410 Unspecified street and highway as the place of occurrence of the external cause: Secondary | ICD-10-CM | POA: Insufficient documentation

## 2016-03-31 DIAGNOSIS — I10 Essential (primary) hypertension: Secondary | ICD-10-CM | POA: Insufficient documentation

## 2016-03-31 MED ORDER — HYDROCODONE-ACETAMINOPHEN 5-325 MG PO TABS: 1.0000 | ORAL_TABLET | Freq: Four times a day (QID) | ORAL | 0 refills | Status: DC | PRN

## 2016-03-31 MED ORDER — HYDROCODONE-ACETAMINOPHEN 5-325 MG PO TABS
1.0000 | ORAL_TABLET | Freq: Once | ORAL | Status: AC
  Administered 2016-03-31: 1 via ORAL
  Filled 2016-03-31: qty 1

## 2016-03-31 MED ORDER — TETANUS-DIPHTH-ACELL PERTUSSIS 5-2.5-18.5 LF-MCG/0.5 IM SUSP
0.5000 mL | Freq: Once | INTRAMUSCULAR | Status: AC
  Administered 2016-03-31: 0.5 mL via INTRAMUSCULAR
  Filled 2016-03-31: qty 0.5

## 2016-03-31 NOTE — ED Triage Notes (Signed)
Pt denies hitting his head or any loss of consciousness.
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
## 2016-03-31 NOTE — ED Notes (Signed)
Pt placed in c-collar. On and aligned in triage.

## 2016-03-31 NOTE — Discharge Instructions (Signed)
Follow up with your md if not improving. °

## 2017-10-04 ENCOUNTER — Ambulatory Visit (INDEPENDENT_AMBULATORY_CARE_PROVIDER_SITE_OTHER): Payer: Medicare Other | Admitting: Internal Medicine

## 2017-10-05 ENCOUNTER — Ambulatory Visit (INDEPENDENT_AMBULATORY_CARE_PROVIDER_SITE_OTHER): Payer: Medicare HMO | Admitting: Internal Medicine

## 2017-10-05 ENCOUNTER — Encounter (INDEPENDENT_AMBULATORY_CARE_PROVIDER_SITE_OTHER): Payer: Self-pay | Admitting: Internal Medicine

## 2017-10-05 ENCOUNTER — Encounter (INDEPENDENT_AMBULATORY_CARE_PROVIDER_SITE_OTHER): Payer: Self-pay | Admitting: *Deleted

## 2017-10-05 DIAGNOSIS — R1310 Dysphagia, unspecified: Secondary | ICD-10-CM

## 2017-10-05 DIAGNOSIS — R1319 Other dysphagia: Secondary | ICD-10-CM

## 2017-10-05 HISTORY — DX: Dysphagia, unspecified: R13.10

## 2017-10-05 NOTE — Patient Instructions (Signed)
EGD/ED. The risks of bleeding, perforation and infection were reviewed with patient.  

## 2017-10-05 NOTE — Progress Notes (Signed)
   Subjective:    Patient ID: David Acosta, male    DOB: October 07, 1925, 85 y.o.   MRN: [id number]  HPI Referred by Dr. Kathryne Eriksson for dysphagia. He says swallowing. Large piece of food give him trouble. He says foods are lodging. Meats are the worse for him.  Symptoms since 1971 off an on.  Thinks is worse in the last year. He says he has to be very careful when he eats. His daughter is a Therapist, sports at [employer].  His appetite is okay. No weight . BMs are normal.  He exercises: weight lifting and walking a mile a week.   Retired from Insurance claims handler.    09/07/2017 H and H 13.5 and 41.4,platelets 222, ALP 88, AST 18, ALT 13.   Review of Systems Past Medical History:  Diagnosis Date  . Dysphagia 10/05/2017  . Glaucoma   . Hypertension   . Macular degeneration   . Premature ventricular contraction 2013    Past Surgical History:  Procedure Laterality Date  . CATARACT EXTRACTION W/PHACO Left 01/28/2014   Procedure: CATARACT EXTRACTION PHACO AND INTRAOCULAR LENS PLACEMENT (IOC);  Surgeon: Elta Guadeloupe T. Gershon Crane, MD;  Location: [facility];  Service: Ophthalmology;  Laterality: Left;  CDE 16.74  . CATARACT EXTRACTION W/PHACO Right 02/18/2014   Procedure: CATARACT EXTRACTION PHACO AND INTRAOCULAR LENS PLACEMENT (IOC);  Surgeon: Elta Guadeloupe T. Gershon Crane, MD;  Location: [facility];  Service: Ophthalmology;  Laterality: Right;  CDE 9.15  . cholesteoma mastoidectomy Right 2012  . Dupytrens contraction Right 2010  . NASAL SEPTUM SURGERY    . TONSILLECTOMY      Allergies  Allergen Reactions  . Capsaicin Other (See Comments)    "heart started to race and blood pressure went up"  . Fluticasone Other (See Comments)    glaucoma  . Procardia Xl [Nifedipine] Other (See Comments)    Rapid heart beat  . Timolol Swelling    Current Outpatient Medications on File Prior to Visit  Medication Sig Dispense Refill  . aspirin EC 81 MG tablet Take 81 mg by mouth daily.    Marland Kitchen diltiazem (CARDIZEM CD) 240 MG 24 hr capsule Take 1 capsule  by mouth daily.     . hydrochlorothiazide (HYDRODIURIL) 25 MG tablet Take 25 mg by mouth daily.    . TRAVATAN Z 0.004 % SOLN ophthalmic solution Place 1 drop into both eyes at bedtime.      No current facility-administered medications on file prior to visit.         Objective:   Physical Exam Blood pressure (!) 160/68, pulse 60, temperature 97.6 F (36.4 C), height 5\' 10"  (1.778 m), weight 193 lb 6.4 oz (87.7 kg). Alert and oriented. Skin warm and dry. Oral mucosa is moist.   . Sclera anicteric, conjunctivae is pink. Thyroid not enlarged. No cervical lymphadenopathy. Lungs clear. Heart regular rate and rhythm.  Abdomen is soft. Bowel sounds are positive. No hepatomegaly. No abdominal masses felt. No tenderness.  No edema to lower extremities.           Assessment & Plan:  Dysphagia. EGD/ED. Esophageal stricture needs to be ruled out. The risks of bleeding, perforation and infection were reviewed with patient.

## 2017-11-23 ENCOUNTER — Encounter (HOSPITAL_COMMUNITY): Payer: Self-pay | Admitting: *Deleted

## 2017-11-23 ENCOUNTER — Other Ambulatory Visit: Payer: Self-pay

## 2017-11-23 ENCOUNTER — Encounter (HOSPITAL_COMMUNITY)
Admission: RE | Disposition: A | Discharge status: Home / Self Care | Payer: Self-pay | Source: Ambulatory Visit | Attending: Internal Medicine

## 2017-11-23 ENCOUNTER — Ambulatory Visit (HOSPITAL_COMMUNITY)
Admission: RE | Admit: 2017-11-23 | Discharge: 2017-11-23 | Disposition: A | Discharge status: Home / Self Care | Payer: Medicare HMO | Source: Ambulatory Visit | Attending: Internal Medicine | Admitting: Internal Medicine

## 2017-11-23 DIAGNOSIS — Z87891 Personal history of nicotine dependence: Secondary | ICD-10-CM | POA: Diagnosis not present

## 2017-11-23 DIAGNOSIS — H3530 Unspecified macular degeneration: Secondary | ICD-10-CM | POA: Diagnosis not present

## 2017-11-23 DIAGNOSIS — K228 Other specified diseases of esophagus: Secondary | ICD-10-CM

## 2017-11-23 DIAGNOSIS — K222 Esophageal obstruction: Secondary | ICD-10-CM | POA: Diagnosis not present

## 2017-11-23 DIAGNOSIS — I10 Essential (primary) hypertension: Secondary | ICD-10-CM | POA: Diagnosis not present

## 2017-11-23 DIAGNOSIS — K449 Diaphragmatic hernia without obstruction or gangrene: Secondary | ICD-10-CM

## 2017-11-23 DIAGNOSIS — H409 Unspecified glaucoma: Secondary | ICD-10-CM | POA: Diagnosis not present

## 2017-11-23 DIAGNOSIS — Z79899 Other long term (current) drug therapy: Secondary | ICD-10-CM | POA: Diagnosis not present

## 2017-11-23 DIAGNOSIS — R1314 Dysphagia, pharyngoesophageal phase: Secondary | ICD-10-CM | POA: Diagnosis not present

## 2017-11-23 DIAGNOSIS — Z7982 Long term (current) use of aspirin: Secondary | ICD-10-CM | POA: Diagnosis not present

## 2017-11-23 DIAGNOSIS — R1319 Other dysphagia: Secondary | ICD-10-CM

## 2017-11-23 DIAGNOSIS — R1310 Dysphagia, unspecified: Secondary | ICD-10-CM | POA: Insufficient documentation

## 2017-11-23 HISTORY — PX: ESOPHAGEAL DILATION: SHX303

## 2017-11-23 HISTORY — PX: ESOPHAGOGASTRODUODENOSCOPY: SHX5428

## 2017-11-23 SURGERY — EGD (ESOPHAGOGASTRODUODENOSCOPY)
Anesthesia: Moderate Sedation

## 2017-11-23 MED ORDER — STERILE WATER FOR IRRIGATION IR SOLN
Status: DC | PRN
  Administered 2017-11-23: 100 mL

## 2017-11-23 MED ORDER — PANTOPRAZOLE SODIUM 40 MG PO TBEC: 40.0000 mg | DELAYED_RELEASE_TABLET | Freq: Every day | ORAL | 5 refills | Status: DC

## 2017-11-23 MED ORDER — MIDAZOLAM HCL 5 MG/5ML IJ SOLN
INTRAMUSCULAR | Status: AC
  Filled 2017-11-23: qty 10

## 2017-11-23 MED ORDER — LIDOCAINE VISCOUS 2 % MT SOLN
OROMUCOSAL | Status: AC
  Filled 2017-11-23: qty 15

## 2017-11-23 MED ORDER — SODIUM CHLORIDE 0.9 % IV SOLN: INTRAVENOUS | Status: DC

## 2017-11-23 MED ORDER — MEPERIDINE HCL 50 MG/ML IJ SOLN
INTRAMUSCULAR | Status: DC | PRN
  Administered 2017-11-23 (×2): 25 mg via INTRAVENOUS

## 2017-11-23 MED ORDER — MEPERIDINE HCL 50 MG/ML IJ SOLN
INTRAMUSCULAR | Status: AC
  Filled 2017-11-23: qty 1

## 2017-11-23 MED ORDER — MIDAZOLAM HCL 5 MG/5ML IJ SOLN
INTRAMUSCULAR | Status: DC | PRN
  Administered 2017-11-23 (×3): 1 mg via INTRAVENOUS

## 2017-11-23 MED ORDER — LIDOCAINE VISCOUS 2 % MT SOLN
OROMUCOSAL | Status: DC | PRN
  Administered 2017-11-23: 4 mL via OROMUCOSAL

## 2017-11-23 NOTE — Op Note (Signed)
[facility] Patient Name: David Acosta Procedure Date: 11/23/2017 1:09 PM MRN: [id number] Date of Birth: [birth year] Attending MD: Hildred Laser , MD CSN: [id number] Age: 84 Admit Type: Outpatient Procedure:                Upper GI endoscopy Indications:              Esophageal dysphagia Providers:                Hildred Laser, MD, Rosina Lowenstein, RN, Nelma Rothman,                            Technician Referring MD:             Jama Flavors. Redmond Pulling, MD Medicines:                Lidocaine spray, Meperidine 50 mg IV, Midazolam 3                            mg IV Complications:            No immediate complications. Estimated Blood Loss:     Estimated blood loss was minimal. Procedure:                Pre-Anesthesia Assessment:                           - Prior to the procedure, a History and Physical                            was performed, and patient medications and                            allergies were reviewed. The patient's tolerance of                            previous anesthesia was also reviewed. The risks                            and benefits of the procedure and the sedation                            options and risks were discussed with the patient.                            All questions were answered, and informed consent                            was obtained. Prior Anticoagulants: The patient                            last took aspirin 2 days prior to the procedure.                            ASA Grade Assessment: II - A patient with mild  systemic disease. After reviewing the risks and                            benefits, the patient was deemed in satisfactory                            condition to undergo the procedure.                           After obtaining informed consent, the endoscope was                            passed under direct vision. Throughout the                            procedure, the patient's blood pressure,  pulse, and                            oxygen saturations were monitored continuously. The                            EG29-I10 ([id number]) scope was introduced through the                            mouth, and advanced to the second part of duodenum.                            The upper GI endoscopy was accomplished without                            difficulty. The patient tolerated the procedure                            well. Scope In: 1:31:49 PM Scope Out: 1:41:43 PM Total Procedure Duration: 0 hours 9 minutes 54 seconds  Findings:      The proximal esophagus and mid esophagus were normal.      One tongue of salmon-colored mucosa was present from 37 to 38 cm.       Biopsies were taken with a cold forceps for histology.      One benign-appearing, intrinsic severe stenosis was found at GEJ (38 cm)       from the incisors. This stenosis measured 9 mm (inner diameter) x less       than one cm (in length). The stenosis was traversed after dilation. A       TTS dilator was passed through the scope. Dilation with a 15-16.5-18 mm       balloon dilator was performed to 15 mm and 16.5 mm. The dilation site       was examined and showed mild mucosal disruption, moderate improvement in       luminal narrowing and no perforation. Estimated blood loss was minimal.      A 3 cm hiatal hernia was present.      The entire examined stomach was normal.      The duodenal bulb and second portion of the duodenum were normal. Impression:               -  Normal proximal esophagus and mid esophagus.                           - Salmon-colored mucosa suspicious for                            short-segment Barrett's esophagus. Biopsied.                           - Benign-appearing highgrade esophageal stenosis at                            GEJ. Dilated.                           - 3 cm hiatal hernia.                           - Normal stomach.                           - Normal duodenal bulb and second portion  of the                            duodenum. Moderate Sedation:      Moderate (conscious) sedation was administered by the endoscopy nurse       and supervised by the endoscopist. The following parameters were       monitored: oxygen saturation, heart rate, blood pressure, CO2       capnography and response to care. Total physician intraservice time was       18 minutes. Recommendation:           - Patient has a contact number available for                            emergencies. The signs and symptoms of potential                            delayed complications were discussed with the                            patient. Return to normal activities tomorrow.                            Written discharge instructions were provided to the                            patient.                           - Resume previous diet today.                           - Continue present medications.                           - Pantoprazole 40 mg po qam.                           -  Resume aspirin at prior dose in 3 days.                           - Await pathology results.                           - Repeat upper endoscopy in 8 weeks. Procedure Code(s):        --- Professional ---                           [phone], Esophagogastroduodenoscopy, flexible,                            transoral; with transendoscopic balloon dilation of                            esophagus (less than 30 mm diameter)                           [id number], Esophagogastroduodenoscopy, flexible,                            transoral; with biopsy, single or multiple                           G0500, Moderate sedation services provided by the                            same physician or other qualified health care                            professional performing a gastrointestinal                            endoscopic service that sedation supports,                            requiring the presence of an independent trained                             observer to assist in the monitoring of the                            patient's level of consciousness and physiological                            status; initial 15 minutes of intra-service time;                            patient age 16 years or older (additional time may                            be reported with [phone], as appropriate) Diagnosis Code(s):        --- Professional ---  K22.8, Other specified diseases of esophagus                           K22.2, Esophageal obstruction                           K44.9, Diaphragmatic hernia without obstruction or                            gangrene                           R13.14, Dysphagia, pharyngoesophageal phase CPT copyright 2017 American Medical Association. All rights reserved. The codes documented in this report are preliminary and upon coder review may  be revised to meet current compliance requirements. Hildred Laser, MD Hildred Laser, MD 11/23/2017 1:58:50 PM This report has been signed electronically. Number of Addenda: 0

## 2017-11-23 NOTE — H&P (Signed)
David Acosta is an 84 y.o. male.   Chief Complaint: Patient is here for EGD and ED. HPI: Patient is a 42-year-old Caucasian male presents with several year history of intermittent solid food dysphagia.  He says he was briefly hospitalized when he had first episode in 26.  He did not go any dilation.  He was hospitalized again a few years later briefly.  He has had intermittent episodes but lately they have been occurring more frequently.  Food bolus eventually passes down.  He points to area sort of bolus obstruction.  He has infrequent heartburn.  He denies nausea vomiting abdominal pain or melena.  He has good appetite.  He has lost few pounds this year voluntarily.  Past Medical History:  Diagnosis Date  .  10/05/2017  . Glaucoma   . Hypertension   . Macular degeneration   . Premature ventricular contraction 2013    Past Surgical History:  Procedure Laterality Date  . CATARACT EXTRACTION W/PHACO Left 01/28/2014   Procedure: CATARACT EXTRACTION PHACO AND INTRAOCULAR LENS PLACEMENT (IOC);  Surgeon: Elta Guadeloupe T. Gershon Crane, MD;  Location: [facility];  Service: Ophthalmology;  Laterality: Left;  CDE 16.74  . CATARACT EXTRACTION W/PHACO Right 02/18/2014   Procedure: CATARACT EXTRACTION PHACO AND INTRAOCULAR LENS PLACEMENT (IOC);  Surgeon: Elta Guadeloupe T. Gershon Crane, MD;  Location: [facility];  Service: Ophthalmology;  Laterality: Right;  CDE 9.15  . cholesteoma mastoidectomy Right 2012  . Dupytrens contraction Right 2010  . NASAL SEPTUM SURGERY    . TONSILLECTOMY      Family History  Problem Relation Age of Onset  . Leukemia Mother   . Heart attack Father   . Heart failure Sister   . Heart attack Brother   . Uterine cancer Paternal Grandfather    Social History:  reports that he quit smoking about 58 years ago. He has never used smokeless tobacco. He reports that he drinks alcohol. He reports that he does not use drugs.  Allergies:  Allergies  Allergen Reactions  . Capsaicin Other (See Comments)    "heart  started to race and blood pressure went up"  . Fluticasone Other (See Comments)    glaucoma  . Procardia Xl [Nifedipine] Other (See Comments)    Rapid heart beat  . Timolol Swelling    Medications Prior to Admission  Medication Sig Dispense Refill  . aspirin EC 81 MG tablet Take 81 mg by mouth daily.    Marland Kitchen diltiazem (CARDIZEM CD) 240 MG 24 hr capsule Take 240 mg by mouth daily.     . hydrochlorothiazide (HYDRODIURIL) 25 MG tablet Take 25 mg by mouth daily.    Marland Kitchen latanoprost (XALATAN) 0.005 % ophthalmic solution Place 1 drop into both eyes at bedtime.    . Multiple Vitamins-Minerals (PRESERVISION AREDS 2) CHEW Chew 1 each by mouth daily.      No results found for this or any previous visit (from the past 48 hour(s)). No results found.  ROS  Blood pressure (!) 157/90, pulse 74, temperature (!) 97.4 F (36.3 C), temperature source Oral, resp. rate 14, SpO2 95 %. Physical Exam  Constitutional: He appears well-developed and well-nourished.  HENT:  Mouth/Throat: Oropharynx is clear and moist.  Eyes: Conjunctivae are normal. No scleral icterus.  Neck: No thyromegaly present.  Cardiovascular: Normal rate, regular rhythm and normal heart sounds.  No murmur heard. Respiratory: Effort normal and breath sounds normal.  GI: Soft. He exhibits no distension and no mass. There is no tenderness.  Musculoskeletal: He exhibits  no edema.  Lymphadenopathy:    He has no cervical adenopathy.  Neurological: He is alert.  Skin: Skin is warm and dry.     Assessment/Plan Solid food dysphagia. EGD with ED.  Hildred Laser, MD 11/23/2017, 1:18 PM

## 2017-11-23 NOTE — Discharge Instructions (Signed)
Upper Endoscopy, Care After Refer to this sheet in the next few weeks. These instructions provide you with information about caring for yourself after your procedure. Your health care provider may also give you more specific instructions. Your treatment has been planned according to current medical practices, but problems sometimes occur. Call your health care provider if you have any problems or questions after your procedure. What can I expect after the procedure? After the procedure, it is common to have:  A sore throat.  Bloating.  Nausea.  Follow these instructions at home:  Follow instructions from your health care provider about what to eat or drink after your procedure.  Return to your normal activities as told by your health care provider. Ask your health care provider what activities are safe for you.  Take over-the-counter and prescription medicines only as told by your health care provider.  Do not drive for 24 hours if you received a sedative.  Keep all follow-up visits as told by your health care provider. This is important. Contact a health care provider if:  You have a sore throat that lasts longer than one day.  You have trouble swallowing. Get help right away if:  You have a fever.  You vomit blood or your vomit looks like coffee grounds.  You have bloody, black, or tarry stools.  You have a severe sore throat or you cannot swallow.  You have difficulty breathing.  You have severe pain in your chest or belly. This information is not intended to replace advice given to you by your health care provider. Make sure you discuss any questions you have with your health care provider.    Esophageal Dilatation Esophageal dilatation is a procedure to open a blocked or narrowed part of the esophagus. The esophagus is the long tube in your throat that carries food and liquid from your mouth to your stomach. The procedure is also called esophageal dilation. You may  need this procedure if you have a buildup of scar tissue in your esophagus that makes it difficult, painful, or even impossible to swallow. This can be caused by gastroesophageal reflux disease (GERD). In rare cases, people need this procedure because they have cancer of the esophagus or a problem with the way food moves through the esophagus. Sometimes you may need to have another dilatation to enlarge the opening of the esophagus gradually. Tell a health care provider about:  Any allergies you have.  All medicines you are taking, including vitamins, herbs, eye drops, creams, and over-the-counter medicines.  Any problems you or family members have had with anesthetic medicines.  Any blood disorders you have.  Any surgeries you have had.  Any medical conditions you have.  Any antibiotic medicines you are required to take before dental procedures. What are the risks? Generally, this is a safe procedure. However, problems can occur and include:  Bleeding from a tear in the lining of the esophagus.  A hole (perforation) in the esophagus.  What happens before the procedure?  Do not eat or drink anything after midnight on the night before the procedure or as directed by your health care provider.  Ask your health care provider about changing or stopping your regular medicines. This is especially important if you are taking diabetes medicines or blood thinners.  Plan to have someone take you home after the procedure. What happens during the procedure?  You will be given a medicine that makes you relaxed and sleepy (sedative).  A medicine may be  sprayed or gargled to numb the back of the throat.  Your health care provider can use various instruments to do an esophageal dilatation. During the procedure, the instrument used will be placed in your mouth and passed down into your esophagus. Options include: ? Simple dilators. This instrument is carefully placed in the esophagus to stretch  it. ? Guided wire bougies. In this method, a flexible tube (endoscope) is used to insert a wire into the esophagus. The dilator is passed over this wire to enlarge the esophagus. Then the wire is removed. ? Balloon dilators. An endoscope with a small balloon at the end is passed down into the esophagus. Inflating the balloon gently stretches the esophagus and opens it up. What happens after the procedure?  Your blood pressure, heart rate, breathing rate, and blood oxygen level will be monitored often until the medicines you were given have worn off.  Your throat may feel slightly sore and will probably still feel numb. This will improve slowly over time.  You will not be allowed to eat or drink until the throat numbness has resolved.  If this is a same-day procedure, you may be allowed to go home once you have been able to drink, urinate, and sit on the edge of the bed without nausea or dizziness.  If this is a same-day procedure, you should have a friend or family member with you for the next 24 hours after the procedure. This information is not intended to replace advice given to you by your health care provider. Make sure you discuss any questions you have with your health care provider.    Resume Aspirin on 11/26/17. Resume other medication as before. Pantoprazole 40 mg by mouth 30 minutes before breakfast daily. Resume usual diet. No driving for 24 hours. Physician will will call with biopsy results. Repeat dilation in 8 weeks.

## 2017-11-27 ENCOUNTER — Other Ambulatory Visit (INDEPENDENT_AMBULATORY_CARE_PROVIDER_SITE_OTHER): Payer: Self-pay | Admitting: *Deleted

## 2017-11-27 ENCOUNTER — Encounter (INDEPENDENT_AMBULATORY_CARE_PROVIDER_SITE_OTHER): Payer: Self-pay | Admitting: *Deleted

## 2017-11-27 DIAGNOSIS — R1310 Dysphagia, unspecified: Secondary | ICD-10-CM

## 2017-11-29 ENCOUNTER — Encounter (HOSPITAL_COMMUNITY): Payer: Self-pay | Admitting: Internal Medicine

## 2017-12-06 ENCOUNTER — Other Ambulatory Visit (INDEPENDENT_AMBULATORY_CARE_PROVIDER_SITE_OTHER): Payer: Self-pay | Admitting: *Deleted

## 2017-12-06 ENCOUNTER — Encounter (INDEPENDENT_AMBULATORY_CARE_PROVIDER_SITE_OTHER): Payer: Self-pay | Admitting: *Deleted

## 2017-12-07 ENCOUNTER — Telehealth (INDEPENDENT_AMBULATORY_CARE_PROVIDER_SITE_OTHER): Payer: Self-pay | Admitting: *Deleted

## 2017-12-07 NOTE — Telephone Encounter (Signed)
Patient states he started taking Zantac 75 mg daily and it seems to be helping and not making him feel bad like pantoprazole did

## 2017-12-08 NOTE — Telephone Encounter (Signed)
Patient needs to take Zantac 150 mg twice daily. Please let patient know.

## 2017-12-08 NOTE — Telephone Encounter (Signed)
Patient was called and a message was left on his voicemail with Dr.Rehman's recommendation.

## 2017-12-11 ENCOUNTER — Other Ambulatory Visit (INDEPENDENT_AMBULATORY_CARE_PROVIDER_SITE_OTHER): Payer: Self-pay | Admitting: *Deleted

## 2017-12-11 MED ORDER — RANITIDINE HCL 150 MG PO CAPS: 150.0000 mg | ORAL_CAPSULE | Freq: Two times a day (BID) | ORAL | 3 refills | Status: DC

## 2018-02-28 ENCOUNTER — Encounter (HOSPITAL_COMMUNITY): Admission: RE | Payer: Self-pay | Source: Ambulatory Visit

## 2018-02-28 ENCOUNTER — Ambulatory Visit (HOSPITAL_COMMUNITY): Admission: RE | Admit: 2018-02-28 | Payer: Medicare HMO | Source: Ambulatory Visit | Admitting: Internal Medicine

## 2018-02-28 SURGERY — EGD (ESOPHAGOGASTRODUODENOSCOPY)
Anesthesia: Moderate Sedation

## 2019-03-23 ENCOUNTER — Inpatient Hospital Stay (HOSPITAL_COMMUNITY)
Admission: EM | Admit: 2019-03-23 | Discharge: 2019-03-26 | DRG: 291 | Disposition: A | Discharge status: Home / Self Care | Payer: Medicare HMO | Attending: Internal Medicine | Admitting: Internal Medicine

## 2019-03-23 ENCOUNTER — Other Ambulatory Visit: Payer: Self-pay

## 2019-03-23 ENCOUNTER — Encounter (HOSPITAL_COMMUNITY): Payer: Self-pay | Admitting: *Deleted

## 2019-03-23 DIAGNOSIS — N179 Acute kidney failure, unspecified: Secondary | ICD-10-CM | POA: Diagnosis present

## 2019-03-23 DIAGNOSIS — Z7982 Long term (current) use of aspirin: Secondary | ICD-10-CM

## 2019-03-23 DIAGNOSIS — Z87891 Personal history of nicotine dependence: Secondary | ICD-10-CM

## 2019-03-23 DIAGNOSIS — I5032 Chronic diastolic (congestive) heart failure: Secondary | ICD-10-CM | POA: Diagnosis present

## 2019-03-23 DIAGNOSIS — I5030 Unspecified diastolic (congestive) heart failure: Secondary | ICD-10-CM | POA: Diagnosis present

## 2019-03-23 DIAGNOSIS — T502X5A Adverse effect of carbonic-anhydrase inhibitors, benzothiadiazides and other diuretics, initial encounter: Secondary | ICD-10-CM | POA: Diagnosis not present

## 2019-03-23 DIAGNOSIS — Z79899 Other long term (current) drug therapy: Secondary | ICD-10-CM

## 2019-03-23 DIAGNOSIS — I110 Hypertensive heart disease with heart failure: Principal | ICD-10-CM | POA: Diagnosis present

## 2019-03-23 DIAGNOSIS — I10 Essential (primary) hypertension: Secondary | ICD-10-CM | POA: Diagnosis present

## 2019-03-23 DIAGNOSIS — Z20828 Contact with and (suspected) exposure to other viral communicable diseases: Secondary | ICD-10-CM | POA: Diagnosis present

## 2019-03-23 DIAGNOSIS — R079 Chest pain, unspecified: Secondary | ICD-10-CM | POA: Diagnosis present

## 2019-03-23 DIAGNOSIS — H409 Unspecified glaucoma: Secondary | ICD-10-CM | POA: Diagnosis present

## 2019-03-23 DIAGNOSIS — H3530 Unspecified macular degeneration: Secondary | ICD-10-CM | POA: Diagnosis present

## 2019-03-23 DIAGNOSIS — I4891 Unspecified atrial fibrillation: Secondary | ICD-10-CM | POA: Diagnosis present

## 2019-03-23 DIAGNOSIS — K219 Gastro-esophageal reflux disease without esophagitis: Secondary | ICD-10-CM | POA: Diagnosis present

## 2019-03-23 DIAGNOSIS — Z8249 Family history of ischemic heart disease and other diseases of the circulatory system: Secondary | ICD-10-CM

## 2019-03-23 DIAGNOSIS — E876 Hypokalemia: Secondary | ICD-10-CM | POA: Diagnosis present

## 2019-03-23 DIAGNOSIS — Z888 Allergy status to other drugs, medicaments and biological substances status: Secondary | ICD-10-CM

## 2019-03-23 DIAGNOSIS — I5031 Acute diastolic (congestive) heart failure: Secondary | ICD-10-CM | POA: Diagnosis present

## 2019-03-23 NOTE — ED Triage Notes (Signed)
Pt c/o mid center chest pain that radiates across the chest and upper back pain that started a few hours ago, pt also reports that he felt like his heart was beating fast.

## 2019-03-24 ENCOUNTER — Other Ambulatory Visit (HOSPITAL_COMMUNITY): Payer: Medicare HMO

## 2019-03-24 ENCOUNTER — Emergency Department (HOSPITAL_COMMUNITY): Payer: Medicare HMO

## 2019-03-24 ENCOUNTER — Observation Stay (HOSPITAL_BASED_OUTPATIENT_CLINIC_OR_DEPARTMENT_OTHER): Payer: Medicare HMO

## 2019-03-24 DIAGNOSIS — K219 Gastro-esophageal reflux disease without esophagitis: Secondary | ICD-10-CM | POA: Diagnosis present

## 2019-03-24 DIAGNOSIS — I110 Hypertensive heart disease with heart failure: Secondary | ICD-10-CM | POA: Diagnosis present

## 2019-03-24 DIAGNOSIS — I10 Essential (primary) hypertension: Secondary | ICD-10-CM | POA: Diagnosis not present

## 2019-03-24 DIAGNOSIS — H409 Unspecified glaucoma: Secondary | ICD-10-CM | POA: Diagnosis present

## 2019-03-24 DIAGNOSIS — I5032 Chronic diastolic (congestive) heart failure: Secondary | ICD-10-CM | POA: Diagnosis present

## 2019-03-24 DIAGNOSIS — T502X5A Adverse effect of carbonic-anhydrase inhibitors, benzothiadiazides and other diuretics, initial encounter: Secondary | ICD-10-CM | POA: Diagnosis not present

## 2019-03-24 DIAGNOSIS — E876 Hypokalemia: Secondary | ICD-10-CM

## 2019-03-24 DIAGNOSIS — Z888 Allergy status to other drugs, medicaments and biological substances status: Secondary | ICD-10-CM | POA: Diagnosis not present

## 2019-03-24 DIAGNOSIS — I4891 Unspecified atrial fibrillation: Secondary | ICD-10-CM

## 2019-03-24 DIAGNOSIS — Z87891 Personal history of nicotine dependence: Secondary | ICD-10-CM | POA: Diagnosis not present

## 2019-03-24 DIAGNOSIS — Z7982 Long term (current) use of aspirin: Secondary | ICD-10-CM | POA: Diagnosis not present

## 2019-03-24 DIAGNOSIS — I5031 Acute diastolic (congestive) heart failure: Secondary | ICD-10-CM | POA: Diagnosis present

## 2019-03-24 DIAGNOSIS — R079 Chest pain, unspecified: Secondary | ICD-10-CM

## 2019-03-24 DIAGNOSIS — H3530 Unspecified macular degeneration: Secondary | ICD-10-CM | POA: Diagnosis present

## 2019-03-24 DIAGNOSIS — I5030 Unspecified diastolic (congestive) heart failure: Secondary | ICD-10-CM | POA: Diagnosis present

## 2019-03-24 DIAGNOSIS — N179 Acute kidney failure, unspecified: Secondary | ICD-10-CM | POA: Diagnosis present

## 2019-03-24 DIAGNOSIS — Z79899 Other long term (current) drug therapy: Secondary | ICD-10-CM | POA: Diagnosis not present

## 2019-03-24 DIAGNOSIS — I5033 Acute on chronic diastolic (congestive) heart failure: Secondary | ICD-10-CM | POA: Diagnosis not present

## 2019-03-24 DIAGNOSIS — Z20828 Contact with and (suspected) exposure to other viral communicable diseases: Secondary | ICD-10-CM | POA: Diagnosis present

## 2019-03-24 DIAGNOSIS — Z8249 Family history of ischemic heart disease and other diseases of the circulatory system: Secondary | ICD-10-CM | POA: Diagnosis not present

## 2019-03-24 LAB — CBC
HCT: 38.6 % — ABNORMAL LOW (ref 39.0–52.0)
HCT: 38.8 % — ABNORMAL LOW (ref 39.0–52.0)
Hemoglobin: 12.4 g/dL — ABNORMAL LOW (ref 13.0–17.0)
Hemoglobin: 12.5 g/dL — ABNORMAL LOW (ref 13.0–17.0)
MCH: 32.2 pg (ref 26.0–34.0)
MCH: 32.3 pg (ref 26.0–34.0)
MCHC: 32.1 g/dL (ref 30.0–36.0)
MCHC: 32.2 g/dL (ref 30.0–36.0)
MCV: 100.3 fL — ABNORMAL HIGH (ref 80.0–100.0)
MCV: 100.3 fL — ABNORMAL HIGH (ref 80.0–100.0)
Platelets: 206 10*3/uL (ref 150–400)
Platelets: 213 10*3/uL (ref 150–400)
RBC: 3.85 MIL/uL — ABNORMAL LOW (ref 4.22–5.81)
RBC: 3.87 MIL/uL — ABNORMAL LOW (ref 4.22–5.81)
RDW: 13.9 % (ref 11.5–15.5)
RDW: 14 % (ref 11.5–15.5)
WBC: 10.2 10*3/uL (ref 4.0–10.5)
WBC: 9.4 10*3/uL (ref 4.0–10.5)
nRBC: 0 % (ref 0.0–0.2)
nRBC: 0 % (ref 0.0–0.2)

## 2019-03-24 LAB — COMPREHENSIVE METABOLIC PANEL
ALT: 13 U/L (ref 0–44)
AST: 17 U/L (ref 15–41)
Albumin: 3.3 g/dL — ABNORMAL LOW (ref 3.5–5.0)
Alkaline Phosphatase: 77 U/L (ref 38–126)
Anion gap: 9 (ref 5–15)
BUN: 16 mg/dL (ref 8–23)
CO2: 29 mmol/L (ref 22–32)
Calcium: 8.4 mg/dL — ABNORMAL LOW (ref 8.9–10.3)
Chloride: 98 mmol/L (ref 98–111)
Creatinine, Ser: 0.99 mg/dL (ref 0.61–1.24)
GFR calc Af Amer: >60 mL/min (ref >60)
GFR calc non Af Amer: >60 mL/min (ref >60)
Glucose, Bld: 118 mg/dL — ABNORMAL HIGH (ref 70–99)
Potassium: 3.2 mmol/L — ABNORMAL LOW (ref 3.5–5.1)
Sodium: 136 mmol/L (ref 135–145)
Total Bilirubin: 0.8 mg/dL (ref 0.3–1.2)
Total Protein: 6.7 g/dL (ref 6.5–8.1)

## 2019-03-24 LAB — BASIC METABOLIC PANEL
Anion gap: 8 (ref 5–15)
BUN: 16 mg/dL (ref 8–23)
CO2: 28 mmol/L (ref 22–32)
Calcium: 8.5 mg/dL — ABNORMAL LOW (ref 8.9–10.3)
Chloride: 100 mmol/L (ref 98–111)
Creatinine, Ser: 0.98 mg/dL (ref 0.61–1.24)
GFR calc Af Amer: >60 mL/min (ref >60)
GFR calc non Af Amer: >60 mL/min (ref >60)
Glucose, Bld: 117 mg/dL — ABNORMAL HIGH (ref 70–99)
Potassium: 3.7 mmol/L (ref 3.5–5.1)
Sodium: 136 mmol/L (ref 135–145)

## 2019-03-24 LAB — LIPID PANEL
Cholesterol: 135 mg/dL (ref 0–200)
HDL: 52 mg/dL (ref >40)
LDL Cholesterol: 75 mg/dL (ref 0–99)
Total CHOL/HDL Ratio: 2.6 RATIO
Triglycerides: 41 mg/dL (ref <150)
VLDL: 8 mg/dL (ref 0–40)

## 2019-03-24 LAB — SARS CORONAVIRUS 2 BY RT PCR (HOSPITAL ORDER, PERFORMED IN ~~LOC~~ HOSPITAL LAB): SARS Coronavirus 2: NEGATIVE

## 2019-03-24 LAB — BRAIN NATRIURETIC PEPTIDE: B Natriuretic Peptide: 147 pg/mL — ABNORMAL HIGH (ref 0.0–100.0)

## 2019-03-24 LAB — ECHOCARDIOGRAM COMPLETE
Height: 70 in
Weight: 3104 oz

## 2019-03-24 LAB — TROPONIN I (HIGH SENSITIVITY)
Troponin I (High Sensitivity): 7 ng/L (ref <18)
Troponin I (High Sensitivity): 6 ng/L (ref <18)
Troponin I (High Sensitivity): 6 ng/L (ref <18)

## 2019-03-24 LAB — MRSA PCR SCREENING: MRSA by PCR: NEGATIVE

## 2019-03-24 LAB — D-DIMER, QUANTITATIVE: D-Dimer, Quant: 0.58 ug/mL-FEU — ABNORMAL HIGH (ref 0.00–0.50)

## 2019-03-24 MED ORDER — ENOXAPARIN SODIUM 40 MG/0.4ML ~~LOC~~ SOLN
40.0000 mg | SUBCUTANEOUS | Status: DC
  Administered 2019-03-24: 40 mg via SUBCUTANEOUS
  Filled 2019-03-24: qty 0.4

## 2019-03-24 MED ORDER — IOHEXOL 350 MG/ML SOLN
100.0000 mL | Freq: Once | INTRAVENOUS | Status: AC | PRN
  Administered 2019-03-24: 100 mL via INTRAVENOUS

## 2019-03-24 MED ORDER — SODIUM CHLORIDE 0.9 % IV SOLN: 250.0000 mL | INTRAVENOUS | Status: DC | PRN

## 2019-03-24 MED ORDER — ACETAMINOPHEN 650 MG RE SUPP: 650.0000 mg | Freq: Four times a day (QID) | RECTAL | Status: DC | PRN

## 2019-03-24 MED ORDER — PANTOPRAZOLE SODIUM 40 MG PO TBEC
40.0000 mg | DELAYED_RELEASE_TABLET | Freq: Every day | ORAL | Status: DC
  Administered 2019-03-24 – 2019-03-26 (×3): 40 mg via ORAL
  Filled 2019-03-24 (×4): qty 1

## 2019-03-24 MED ORDER — LATANOPROST 0.005 % OP SOLN
1.0000 [drp] | Freq: Every day | OPHTHALMIC | Status: DC
  Administered 2019-03-24 – 2019-03-25 (×2): 1 [drp] via OPHTHALMIC
  Filled 2019-03-24: qty 2.5

## 2019-03-24 MED ORDER — ACETAMINOPHEN 325 MG PO TABS
650.0000 mg | ORAL_TABLET | Freq: Four times a day (QID) | ORAL | Status: DC | PRN
  Administered 2019-03-26: 650 mg via ORAL
  Filled 2019-03-24: qty 2

## 2019-03-24 MED ORDER — ASPIRIN EC 81 MG PO TBEC
81.0000 mg | DELAYED_RELEASE_TABLET | Freq: Every day | ORAL | Status: DC
  Administered 2019-03-24 – 2019-03-26 (×3): 81 mg via ORAL
  Filled 2019-03-24 (×3): qty 1

## 2019-03-24 MED ORDER — POTASSIUM CHLORIDE CRYS ER 20 MEQ PO TBCR
40.0000 meq | EXTENDED_RELEASE_TABLET | Freq: Once | ORAL | Status: AC
  Administered 2019-03-24: 40 meq via ORAL
  Filled 2019-03-24: qty 2

## 2019-03-24 MED ORDER — NITROGLYCERIN 2 % TD OINT
0.5000 [in_us] | TOPICAL_OINTMENT | Freq: Three times a day (TID) | TRANSDERMAL | Status: DC
  Administered 2019-03-24 – 2019-03-26 (×4): 0.5 [in_us] via TOPICAL
  Filled 2019-03-24: qty 30
  Filled 2019-03-24: qty 1

## 2019-03-24 MED ORDER — NITROGLYCERIN 0.4 MG SL SUBL
0.4000 mg | SUBLINGUAL_TABLET | Freq: Once | SUBLINGUAL | Status: AC
  Administered 2019-03-24: 0.4 mg via SUBLINGUAL
  Filled 2019-03-24: qty 1

## 2019-03-24 MED ORDER — HYDROCHLOROTHIAZIDE 25 MG PO TABS
25.0000 mg | ORAL_TABLET | Freq: Every day | ORAL | Status: DC
  Administered 2019-03-24 – 2019-03-26 (×3): 25 mg via ORAL
  Filled 2019-03-24 (×3): qty 1

## 2019-03-24 MED ORDER — SODIUM CHLORIDE 0.9% FLUSH: 3.0000 mL | Freq: Two times a day (BID) | INTRAVENOUS | Status: DC

## 2019-03-24 MED ORDER — DILTIAZEM HCL ER COATED BEADS 240 MG PO CP24
240.0000 mg | ORAL_CAPSULE | Freq: Every day | ORAL | Status: DC
  Administered 2019-03-24 – 2019-03-26 (×3): 240 mg via ORAL
  Filled 2019-03-24 (×3): qty 1

## 2019-03-24 MED ORDER — SODIUM CHLORIDE 0.9% FLUSH: 3.0000 mL | INTRAVENOUS | Status: DC | PRN

## 2019-03-24 MED ORDER — FUROSEMIDE 10 MG/ML IJ SOLN
40.0000 mg | Freq: Two times a day (BID) | INTRAMUSCULAR | Status: DC
  Administered 2019-03-24 – 2019-03-26 (×4): 40 mg via INTRAVENOUS
  Filled 2019-03-24 (×4): qty 4

## 2019-03-24 MED ORDER — FUROSEMIDE 10 MG/ML IJ SOLN
20.0000 mg | Freq: Once | INTRAMUSCULAR | Status: AC
  Administered 2019-03-24: 20 mg via INTRAVENOUS
  Filled 2019-03-24: qty 2

## 2019-03-24 MED ORDER — ATORVASTATIN CALCIUM 80 MG PO TABS
80.0000 mg | ORAL_TABLET | Freq: Every day | ORAL | Status: DC
  Administered 2019-03-25: 80 mg via ORAL
  Filled 2019-03-24 (×2): qty 1

## 2019-03-24 MED ORDER — PROSIGHT PO TABS
1.0000 | ORAL_TABLET | Freq: Every day | ORAL | Status: DC
  Administered 2019-03-24 – 2019-03-26 (×3): 1 via ORAL
  Filled 2019-03-24 (×3): qty 1

## 2019-03-24 MED ORDER — APIXABAN 5 MG PO TABS
5.0000 mg | ORAL_TABLET | Freq: Two times a day (BID) | ORAL | Status: DC
  Administered 2019-03-24 – 2019-03-26 (×4): 5 mg via ORAL
  Filled 2019-03-24 (×4): qty 1

## 2019-03-24 MED ORDER — ASPIRIN 81 MG PO CHEW
324.0000 mg | CHEWABLE_TABLET | Freq: Once | ORAL | Status: AC
  Administered 2019-03-24: 324 mg via ORAL
  Filled 2019-03-24: qty 4

## 2019-03-24 NOTE — ED Provider Notes (Signed)
[facility] EMERGENCY DEPARTMENT Provider Note   CSN: [id number] Arrival date & time: 03/23/19  2335     History   Chief Complaint Chief Complaint  Patient presents with   Chest Pain    HPI David Acosta is a 85 y.o. male.     Patient presents with upper back pain that radiates to his neck and chest.  This onset about 2 hours ago while he was lying in bed.  He describes a pain and tightness in his upper back and his chest that is worse with deep breathing.  He does have some shortness of breath.  No nausea or vomiting.  No cough, fever he did have some chills earlier.  Also has new leg swelling today.  Denies any cardiac history.  Reports having a stress test many years ago that was reassuring.  He is never had this kind of pain before.  It is worse with breathing.  He took aspirin at home without relief.  It is not positional.  He describes the pain as a tightness that radiates to his neck and upper back more so in his back and his chest.  There is no abdominal pain or lower back pain.  There is no vomiting or diarrhea.  The history is provided by the patient.  Chest Pain Associated symptoms: shortness of breath   Associated symptoms: no abdominal pain, no dizziness, no fever, no headache, no nausea, no vomiting and no weakness     Past Medical History:  Diagnosis Date   Dysphagia 10/05/2017   Glaucoma    Hypertension    Macular degeneration    Premature ventricular contraction 2013    Patient Active Problem List   Diagnosis Date Noted   Dysphagia 10/05/2017   Esophageal dysphagia 10/05/2017   PVC (premature ventricular contraction) 03/12/2013   HTN (hypertension) 03/12/2013    Past Surgical History:  Procedure Laterality Date   CATARACT EXTRACTION W/PHACO Left 01/28/2014   Procedure: CATARACT EXTRACTION PHACO AND INTRAOCULAR LENS PLACEMENT ([place]);  Surgeon: David Guadeloupe T. Gershon Crane, Acosta;  Location: [facility];  Service: Ophthalmology;  Laterality: Left;  CDE 16.74    CATARACT EXTRACTION W/PHACO Right 02/18/2014   Procedure: CATARACT EXTRACTION PHACO AND INTRAOCULAR LENS PLACEMENT (IOC);  Surgeon: David Guadeloupe T. Gershon Crane, Acosta;  Location: [facility];  Service: Ophthalmology;  Laterality: Right;  CDE 9.15   cholesteoma mastoidectomy Right 2012   Dupytrens contraction Right 2010   ESOPHAGEAL DILATION N/A 11/23/2017   Procedure: ESOPHAGEAL DILATION;  Surgeon: David Houston, Acosta;  Location: [facility];  Service: Endoscopy;  Laterality: N/A;   ESOPHAGOGASTRODUODENOSCOPY N/A 11/23/2017   Procedure: ESOPHAGOGASTRODUODENOSCOPY (EGD);  Surgeon: David Houston, Acosta;  Location: [facility];  Service: Endoscopy;  Laterality: N/A;  12:45   NASAL SEPTUM SURGERY     TONSILLECTOMY          Home Medications    Prior to Admission medications   Medication Sig Start Date End Date Taking? Authorizing Provider  aspirin EC 81 MG tablet Take 1 tablet (81 mg total) by mouth daily. 11/26/17   David Acosta  diltiazem (CARDIZEM CD) 240 MG 24 hr capsule Take 240 mg by mouth daily.  01/02/14   David Acosta  hydrochlorothiazide (HYDRODIURIL) 25 MG tablet Take 25 mg by mouth daily.    David Acosta  latanoprost (XALATAN) 0.005 % ophthalmic solution Place 1 drop into both eyes at bedtime.    David Acosta  Multiple Vitamins-Minerals (PRESERVISION AREDS 2) CHEW  Chew 1 each by mouth daily.    David Acosta  pantoprazole (PROTONIX) 40 MG tablet Take 1 tablet (40 mg total) by mouth daily before breakfast. 11/23/17   David Acosta  ranitidine (ZANTAC) 150 MG capsule Take 1 capsule (150 mg total) by mouth 2 (two) times daily. 12/11/17   David Houston, Acosta    Family History Family History  Problem Relation Age of Onset   Leukemia Mother    Heart attack Father    Heart failure Sister    Heart attack Brother    Uterine cancer Paternal Grandfather     Social History Social History   Tobacco Use   Smoking status: Former  Smoker    Quit date: 01/23/1959    Years since quitting: 60.2   Smokeless tobacco: Never Used  Substance Use Topics   Alcohol use: Yes    Comment: daily, 2.7 oz    Drug use: No     Allergies   Capsaicin, Fluticasone, Procardia xl [nifedipine], and Timolol   Review of Systems Review of Systems  Constitutional: Negative for activity change, appetite change and fever.  HENT: Negative for congestion and rhinorrhea.   Eyes: Negative for visual disturbance.  Respiratory: Positive for chest tightness and shortness of breath.   Cardiovascular: Positive for chest pain.  Gastrointestinal: Negative for abdominal pain, nausea and vomiting.  Genitourinary: Negative for dysuria and hematuria.  Musculoskeletal: Negative for arthralgias and myalgias.  Skin: Negative for rash.  Neurological: Negative for dizziness, weakness, light-headedness and headaches.    all other systems are negative except as noted in the HPI and PMH.    Physical Exam Updated Vital Signs BP (!) 144/66    Pulse 85    Temp 97.7 F (36.5 C) (Oral)    Resp 16    Ht 5\' 10"  (1.778 m)    Wt 88 kg    SpO2 98%    BMI 27.84 kg/m   Physical Exam Vitals signs and nursing note reviewed.  Constitutional:      General: He is not in acute distress.    Appearance: He is well-developed.  HENT:     Head: Normocephalic and atraumatic.     Mouth/Throat:     Pharynx: No oropharyngeal exudate.  Eyes:     Conjunctiva/sclera: Conjunctivae normal.     Pupils: Pupils are equal, round, and reactive to light.  Neck:     Musculoskeletal: Normal range of motion and neck supple.     Comments: No meningismus. Cardiovascular:     Rate and Rhythm: Normal rate and regular rhythm.     Heart sounds: Normal heart sounds. No murmur.     Comments: Equal DP pulses bilaterally, equal femoral pulses bilaterally Pulmonary:     Effort: Pulmonary effort is normal. No respiratory distress.     Breath sounds: Normal breath sounds.  Chest:      Chest wall: No tenderness.  Abdominal:     Palpations: Abdomen is soft.     Tenderness: There is no abdominal tenderness. There is no guarding or rebound.  Musculoskeletal: Normal range of motion.        General: No tenderness.     Right lower leg: Edema present.     Left lower leg: Edema present.  Skin:    General: Skin is warm.     Capillary Refill: Capillary refill takes less than 2 seconds.  Neurological:     General: No focal deficit present.     Mental Status:  He is alert and oriented to person, place, and time. Mental status is at baseline.     Cranial Nerves: No cranial nerve deficit.     Motor: No abnormal muscle tone.     Coordination: Coordination normal.     Comments: No ataxia on finger to nose bilaterally. No pronator drift. 5/5 strength throughout. CN 2-12 intact.Equal grip strength. Sensation intact.   Psychiatric:        Behavior: Behavior normal.      ED Treatments / Results  Labs (all labs ordered are listed, but only abnormal results are displayed) Labs Reviewed  BASIC METABOLIC PANEL - Abnormal; Notable for the following components:      Result Value   Glucose, Bld 117 (*)    Calcium 8.5 (*)    All other components within normal limits  CBC - Abnormal; Notable for the following components:   RBC 3.87 (*)    Hemoglobin 12.5 (*)    HCT 38.8 (*)    MCV 100.3 (*)    All other components within normal limits  BRAIN NATRIURETIC PEPTIDE - Abnormal; Notable for the following components:   B Natriuretic Peptide 147.0 (*)    All other components within normal limits  D-DIMER, QUANTITATIVE (NOT AT [facility]) - Abnormal; Notable for the following components:   D-Dimer, Quant 0.58 (*)    All other components within normal limits  SARS CORONAVIRUS 2 (HOSPITAL ORDER, [facility])  COMPREHENSIVE METABOLIC PANEL  CBC  LIPID PANEL  TROPONIN I (HIGH SENSITIVITY)  TROPONIN I (HIGH SENSITIVITY)    EKG EKG  Interpretation  Date/Time:  Saturday March 23 2019 23:50:28 EDT Ventricular Rate:  85 PR Interval:    QRS Duration: 104 QT Interval:  399 QTC Calculation: 475 R Axis:   -52 Text Interpretation:  Sinus rhythm Atrial premature complex LAD, consider left anterior fascicular block Borderline low voltage, extremity leads No significant change was found Confirmed by Ezequiel Essex [phone]) on 03/23/2019 11:56:05 PM   Radiology Dg Chest Portable 1 View  Result Date: 03/24/2019 CLINICAL DATA:  Chest pain. EXAM: PORTABLE CHEST 1 VIEW COMPARISON:  Radiograph 01/12/2015 FINDINGS: Chronic hyperinflation. Upper normal heart size with unchanged mediastinal contours. Vascular congestion. New interstitial and bronchial thickening. No confluent airspace disease. No large pleural effusion. No pneumothorax. IMPRESSION: 1. Vascular congestion and suggestion of mild pulmonary edema. 2. Chronic hyperinflation. Electronically Signed   By: Keith Rake M.D.   On: 03/24/2019 00:20   Ct Angio Chest/abd/pel For Dissection W And/or Wo Contrast  Result Date: 03/24/2019 CLINICAL DATA:  Chest pain radiating to upper back. Positive D-dimer. EXAM: CT ANGIOGRAPHY CHEST, ABDOMEN AND PELVIS TECHNIQUE: Multidetector CT imaging through the chest, abdomen and pelvis was performed using the standard protocol during bolus administration of intravenous contrast. Multiplanar reconstructed images and MIPs were obtained and reviewed to evaluate the vascular anatomy. CONTRAST:  117mL OMNIPAQUE IOHEXOL 350 MG/ML SOLN COMPARISON:  None. FINDINGS: CTA CHEST FINDINGS Cardiovascular: --Heart: The heart size is normal. There is nopericardial effusion. There are coronary artery calcifications. --Aorta: The course and caliber of the thoracic aorta are normal. There is mild aortic atherosclerotic calcification. Precontrast images show no aortic intramural hematoma. There is no blood pool, dissection or penetrating ulcer demonstrated on arterial  phase postcontrast imaging. There is a conventional 3 vessel aortic arch branching pattern. The proximal arch vessels are widely patent. --Pulmonary Arteries: Contrast timing is optimized for preferential opacification of the aorta. Within that limitation, normal central pulmonary arteries. Mediastinum/Nodes:  Level 4R lymph node measures 23 mm. There are multiple other subcentimeter mediastinal nodes. No axillary adenopathy. Normal course of the thoracic esophagus. The thyroid gland is normal. Lungs/Pleura: Mild biapical scarring. No pleural effusion or pneumothorax. No nodules or masses. Musculoskeletal: No chest wall abnormality. No acute osseous findings. Review of the MIP images confirms the above findings. CTA ABDOMEN AND PELVIS FINDINGS VASCULAR Aorta: Normal caliber aorta without aneurysm, dissection, vasculitis or hemodynamically significant stenosis. There is moderate aortic atherosclerosis. Celiac: No aneurysm, dissection or hemodynamically significant stenosis. Normal branching pattern. SMA: Widely patent without dissection or stenosis. Renals: Paired right and single left renal arteries. No aneurysm, dissection, stenosis or evidence of fibromuscular dysplasia. IMA: Narrowing at the IMA origin, but otherwise normal. Inflow: No aneurysm, stenosis or dissection. Veins: Normal course and caliber of the major veins. Assessment is otherwise limited by the arterial dominant contrast phase. Review of the MIP images confirms the above findings. NON-VASCULAR Hepatobiliary: Normal hepatic contours and density. No visible biliary dilatation. Cholelithiasis without acute inflammation. Pancreas: Normal contours without ductal dilatation. No peripancreatic fluid collection. Spleen: Normal arterial phase splenic enhancement pattern. Adrenals/Urinary Tract: --Adrenal glands: Normal. --Right kidney/ureter: No hydronephrosis or perinephric stranding. No nephrolithiasis. No obstructing ureteral stones. --Left kidney/ureter:  Parenchymal scarring near the lateral upper pole. --Urinary bladder: Unremarkable. Stomach/Bowel: --Stomach/Duodenum: No hiatal hernia or other gastric abnormality. Normal duodenal course and caliber. --Small bowel: No dilatation or inflammation. --Colon: Rectosigmoid diverticulosis without acute inflammation. --Appendix: Normal. Lymphatic:  No abdominal or pelvic lymphadenopathy. Reproductive: Normal prostate and seminal vesicles. Musculoskeletal. Multilevel degenerative disc disease and facet arthrosis. No bony spinal canal stenosis. Grade 1 anterolisthesis at L5-S1 due to bilateral L5 pars interarticularis defects. Other: None. Review of the MIP images confirms the above findings. IMPRESSION: 1. No acute aortic syndrome or other acute abnormality of the chest, abdomen or pelvis. 2. Enlarged level 4R mediastinal lymph node, uncertain significance. 3. Grade 1 anterolisthesis at L5-S1 due to pars interarticularis defects. 4. Aortic atherosclerosis (ICD10-I70.0). Coronary artery calcification. Electronically Signed   By: Ulyses Jarred M.D.   On: 03/24/2019 02:17    Procedures Procedures (including critical care time)  Medications Ordered in ED Medications  aspirin chewable tablet 324 mg (has no administration in time range)  nitroGLYCERIN (NITROSTAT) SL tablet 0.4 mg (has no administration in time range)     Initial Impression / Assessment and Plan / ED Course  I have reviewed the triage vital signs and the nursing notes.  Pertinent labs & imaging results that were available during my care of the patient were reviewed by me and considered in my medical decision making (see chart for details).       Chest and upper back pain that is pleuritic in nature.  EKG shows no acute ischemia but does show anterior and inferior Q waves.  Age-adjusted d-dimer negative  Patient given aspirin nitroglycerin with some improvement in his chest pain.  His troponin is negative.  CTA shows no evidence of aortic  dissection. No Large PE seen.  Age-adjusted d-dimer negative.  Patient's heart score is 5.  Will plan observation admission for rule out.  Chest pain is improving but still present.  Discussed with Dr. Maudie Mercury.  CRITICAL CARE Performed by: Ezequiel Essex Total critical care time: 32 minutes Critical care time was exclusive of separately billable procedures and treating other patients. Critical care was necessary to treat or prevent imminent or life-threatening deterioration. Critical care was time spent personally by me on the following activities: development of treatment  plan with patient and/or surrogate as well as nursing, discussions with consultants, evaluation of patient's response to treatment, examination of patient, obtaining history from patient or surrogate, ordering and performing treatments and interventions, ordering and review of laboratory studies, ordering and review of radiographic studies, pulse oximetry and re-evaluation of patient's condition.   Final Clinical Impressions(s) / ED Diagnoses   Final diagnoses:  Chest pain, unspecified type    ED Discharge Orders    None       Izak Anding, Annie Main, Acosta 03/24/19 [phone]

## 2019-03-24 NOTE — ED Notes (Signed)
Christy (Agricultural consultant) on 2N given my direct number to get nurse assigned to call for report.

## 2019-03-24 NOTE — H&P (Signed)
TRH H&P    Patient Demographics:    David Acosta, is a 85 y.o. male  MRN: [id number]  DOB - 10-20-1925  Admit Date - 03/23/2019  Referring MD/NP/PA:   [facility]  Outpatient Primary MD for the patient is Christain Sacramento, MD  Patient coming from:  home  Chief complaint- chest pain   HPI:    David Acosta  is a 85 y.o. male, w hyperrtension, dysphagia, esophageal stricture, apparently c/o chest pain starting 10pm at rest while lying in bed. Cp runs across his chest from left to right side, pt also noted some back pain between shoulders.  Nothing appears to make pain better or worse.  Pt denies fever, chills, cough, palp, n/v, abd pain, diarrhea, brbpr, black stool, dysuria, hematurai.  Pt will be admitted for w/up of chest pain   In ED T 97.7, P 85, R 16, Bp 144/66 pox 98% on RA Wt 88kg   Na 136, K 3.7, Bun 16, Creatinine 0.98 Wbc 10.2, Hgb 12.5, Plt 213 Trop 7 D dimer 0.58 BNP 147 covid - 19 negative  CTA chest IMPRESSION: 1. No acute aortic syndrome or other acute abnormality of the chest, abdomen or pelvis. 2. Enlarged level 4R mediastinal lymph node, uncertain significance. 3. Grade 1 anterolisthesis at L5-S1 due to pars interarticularis defects. 4. Aortic atherosclerosis (ICD10-I70.0). Coronary artery calcification.  Pt will be admitted for w/up of chest pain     Review of systems:    In addition to the HPI above,  No Fever-chills, No Headache, No changes with Vision or hearing, No problems swallowing food or Liquids, No  Cough or Shortness of Breath, No Abdominal pain, No Nausea or Vomiting, bowel movements are regular, No Blood in stool or Urine, No dysuria, No new skin rashes or bruises, No new joints pains-aches,  No new weakness, tingling, numbness in any extremity, No recent weight gain or loss, No polyuria, polydypsia or polyphagia, No significant Mental  Stressors.  All other systems reviewed and are negative.    Past History of the following :    Past Medical History:  Diagnosis Date   Dysphagia 10/05/2017   Glaucoma    Hypertension    Macular degeneration    Premature ventricular contraction 2013      Past Surgical History:  Procedure Laterality Date   CATARACT EXTRACTION W/PHACO Left 01/28/2014   Procedure: CATARACT EXTRACTION PHACO AND INTRAOCULAR LENS PLACEMENT ([place]);  Surgeon: Elta Guadeloupe T. Gershon Crane, MD;  Location: [facility];  Service: Ophthalmology;  Laterality: Left;  CDE 16.74   CATARACT EXTRACTION W/PHACO Right 02/18/2014   Procedure: CATARACT EXTRACTION PHACO AND INTRAOCULAR LENS PLACEMENT ([facility]);  Surgeon: Elta Guadeloupe T. Gershon Crane, MD;  Location: [facility];  Service: Ophthalmology;  Laterality: Right;  CDE 9.15   cholesteoma mastoidectomy Right 2012   Dupytrens contraction Right 2010   ESOPHAGEAL DILATION N/A 11/23/2017   Procedure: ESOPHAGEAL DILATION;  Surgeon: Rogene Houston, MD;  Location: [facility];  Service: Endoscopy;  Laterality: N/A;   ESOPHAGOGASTRODUODENOSCOPY N/A 11/23/2017   Procedure: ESOPHAGOGASTRODUODENOSCOPY ([facility]);  Surgeon: Rogene Houston, MD;  Location: [facility];  Service: Endoscopy;  Laterality: N/A;  12:45   NASAL SEPTUM SURGERY     TONSILLECTOMY        Social History:      Social History   Tobacco Use   Smoking status: Former Smoker    Quit date: 01/23/1959    Years since quitting: 60.2   Smokeless tobacco: Never Used  Substance Use Topics   Alcohol use: Yes    Comment: daily, 2.7 oz        Family History :     Family History  Problem Relation Age of Onset   Leukemia Mother    Heart attack Father    Heart failure Sister    Heart attack Brother    Uterine cancer Paternal Grandfather        Home Medications:   Prior to Admission medications   Medication Sig Start Date End Date Taking? Authorizing Provider  aspirin EC 81 MG tablet Take 1 tablet (81 mg total) by  mouth daily. 11/26/17   Rehman, Mechele Dawley, MD  diltiazem (CARDIZEM CD) 240 MG 24 hr capsule Take 240 mg by mouth daily.  01/02/14   [provider]  hydrochlorothiazide (HYDRODIURIL) 25 MG tablet Take 25 mg by mouth daily.    [provider]  latanoprost (XALATAN) 0.005 % ophthalmic solution Place 1 drop into both eyes at bedtime.    [provider]  Multiple Vitamins-Minerals (PRESERVISION AREDS 2) CHEW Chew 1 each by mouth daily.    [provider]  pantoprazole (PROTONIX) 40 MG tablet Take 1 tablet (40 mg total) by mouth daily before breakfast. 11/23/17   Rehman, Mechele Dawley, MD  ranitidine (ZANTAC) 150 MG capsule Take 1 capsule (150 mg total) by mouth 2 (two) times daily. 12/11/17   Rogene Houston, MD     Allergies:     Allergies  Allergen Reactions   Capsaicin Other (See Comments)    "heart started to race and blood pressure went up"   Fluticasone Other (See Comments)    glaucoma   Procardia Xl [Nifedipine] Other (See Comments)    Rapid heart beat   Timolol Swelling     Physical Exam:   Vitals  Blood pressure 113/73, pulse 76, temperature 97.7 F (36.5 C), temperature source Oral, resp. rate 16, height 5\' 10"  (1.778 m), weight 88 kg, SpO2 96 %.  1.  General: axoxo3  2. Psychiatric: euthymic  3. Neurologic: Cn2-12 intact, reflexes 2+ symmetric, diffuse with no clonus, motor 5/5 in all 4 ext  4. HEENMT:  Anicteric, pupils 1.41mm symmetric, direct, consensual intact Neck: no jvd, no bruit  5. Respiratory : CTAB  6. Cardiovascular : rrr s1, s2, no m/g/r  7. Gastrointestinal:  Abd: soft, nt, nd, +bs  8. Skin:  Ext: no c/c/e,  No rash  9.Musculoskeletal:  Good ROM,  No adenopathy     Data Review:    CBC Recent Labs  Lab 03/24/19 0016  WBC 10.2  HGB 12.5*  HCT 38.8*  PLT 213  MCV 100.3*  MCH 32.3  MCHC 32.2  RDW 13.9    ------------------------------------------------------------------------------------------------------------------  Results for orders placed or performed during the hospital encounter of 03/23/19 (from the past 48 hour(s))  Basic metabolic panel     Status: Abnormal   Collection Time: 03/24/19 12:16 AM  Result Value Ref Range   Sodium 136 135 - 145 mmol/L   Potassium 3.7 3.5 - 5.1 mmol/L  Chloride 100 98 - 111 mmol/L   CO2 28 22 - 32 mmol/L   Glucose, Bld 117 (H) 70 - 99 mg/dL   BUN 16 8 - 23 mg/dL   Creatinine, Ser 0.98 0.61 - 1.24 mg/dL   Calcium 8.5 (L) 8.9 - 10.3 mg/dL   GFR calc non Af Amer >60 >60 mL/min   GFR calc Af Amer >60 >60 mL/min   Anion gap 8 5 - 15    Comment: Performed at [facility], [address], [place], [place] [address]  CBC     Status: Abnormal   Collection Time: 03/24/19 12:16 AM  Result Value Ref Range   WBC 10.2 4.0 - 10.5 K/uL   RBC 3.87 (L) 4.22 - 5.81 MIL/uL   Hemoglobin 12.5 (L) 13.0 - 17.0 g/dL   HCT 38.8 (L) 39.0 - 52.0 %   MCV 100.3 (H) 80.0 - 100.0 fL   MCH 32.3 26.0 - 34.0 pg   MCHC 32.2 30.0 - 36.0 g/dL   RDW 13.9 11.5 - 15.5 %   Platelets 213 150 - 400 K/uL   nRBC 0.0 0.0 - 0.2 %    Comment: Performed at [facility], [address], [place], [place] [address]  Troponin I (High Sensitivity)     Status: None   Collection Time: 03/24/19 12:16 AM  Result Value Ref Range   Troponin I (High Sensitivity) 7 <18 ng/L    Comment: (NOTE) Elevated high sensitivity troponin I (hsTnI) values and significant  changes across serial measurements may suggest ACS but many other  chronic and acute conditions are known to elevate hsTnI results.  Refer to the "Links" section for chest pain algorithms and additional  guidance. Performed at [facility], [address] [place], [place] [address]   D-dimer, quantitative (not at [facility])     Status: Abnormal   Collection Time: 03/24/19 12:16 AM  Result Value Ref Range   D-Dimer, Quant 0.58 (H) 0.00 -  0.50 ug/mL-FEU    Comment: (NOTE) At the manufacturer cut-off of 0.50 ug/mL FEU, this assay has been documented to exclude PE with a sensitivity and negative predictive value of 97 to 99%.  At this time, this assay has not been approved by the FDA to exclude DVT/VTE. Results should be correlated with clinical presentation. Performed at [facility], [address], [place], [place] [address]   Brain natriuretic peptide     Status: Abnormal   Collection Time: 03/24/19 12:18 AM  Result Value Ref Range   B Natriuretic Peptide 147.0 (H) 0.0 - 100.0 pg/mL    Comment: Performed at [facility], [address] [place], [place] [address]  SARS Coronavirus 2 [facility] order, Performed in [facility] hospital lab) Nasopharyngeal Nasopharyngeal Swab     Status: None   Collection Time: 03/24/19 12:19 AM   Specimen: Nasopharyngeal Swab  Result Value Ref Range   SARS Coronavirus 2 NEGATIVE NEGATIVE    Comment: (NOTE) If result is NEGATIVE SARS-CoV-2 target nucleic acids are NOT DETECTED. The SARS-CoV-2 RNA is generally detectable in upper and lower  respiratory specimens during the acute phase of infection. The lowest  concentration of SARS-CoV-2 viral copies this assay can detect is 250  copies / mL. A negative result does not preclude SARS-CoV-2 infection  and should not be used as the sole basis for treatment or other  patient management decisions.  A negative result may occur with  improper specimen collection / handling, submission of specimen other  than nasopharyngeal swab,  presence of viral mutation(s) within the  areas targeted by this assay, and inadequate number of viral copies  (<250 copies / mL). A negative result must be combined with clinical  observations, patient history, and epidemiological information. If result is POSITIVE SARS-CoV-2 target nucleic acids are DETECTED. The SARS-CoV-2 RNA is generally detectable in upper and lower  respiratory specimens dur ing the acute phase  of infection.  Positive  results are indicative of active infection with SARS-CoV-2.  Clinical  correlation with patient history and other diagnostic information is  necessary to determine patient infection status.  Positive results do  not rule out bacterial infection or co-infection with other viruses. If result is PRESUMPTIVE POSTIVE SARS-CoV-2 nucleic acids MAY BE PRESENT.   A presumptive positive result was obtained on the submitted specimen  and confirmed on repeat testing.  While 2019 novel coronavirus  (SARS-CoV-2) nucleic acids may be present in the submitted sample  additional confirmatory testing may be necessary for epidemiological  and / or clinical management purposes  to differentiate between  SARS-CoV-2 and other Sarbecovirus currently known to infect humans.  If clinically indicated additional testing with an alternate test  methodology [id number]) is advised. The SARS-CoV-2 RNA is generally  detectable in upper and lower respiratory sp ecimens during the acute  phase of infection. The expected result is Negative. Fact Sheet for Patients:  StrictlyIdeas.no Fact Sheet for Healthcare Providers: BankingDealers.co.za This test is not yet approved or cleared by the [place] FDA and has been authorized for detection and/or diagnosis of SARS-CoV-2 by FDA under an Emergency Use Authorization (EUA).  This EUA will remain in effect (meaning this test can be used) for the duration of the COVID-19 declaration under Section 564(b)(1) of the Act, 21 U.S.C. section 360bbb-3(b)(1), unless the authorization is terminated or revoked sooner. Performed at [facility], [address] [place], [place] [address]     Chemistries  Recent Labs  Lab 03/24/19 0016  NA 136  K 3.7  CL 100  CO2 28  GLUCOSE 117*  BUN 16  CREATININE 0.98  CALCIUM 8.5*    ------------------------------------------------------------------------------------------------------------------  ------------------------------------------------------------------------------------------------------------------ GFR: Estimated Creatinine Clearance: 56 mL/min (by C-G formula based on SCr of 0.98 mg/dL). Liver Function Tests: No results for input(s): AST, ALT, ALKPHOS, BILITOT, PROT, ALBUMIN in the last 168 hours. No results for input(s): LIPASE, AMYLASE in the last 168 hours. No results for input(s): AMMONIA in the last 168 hours. Coagulation Profile: No results for input(s): INR, PROTIME in the last 168 hours. Cardiac Enzymes: No results for input(s): CKTOTAL, CKMB, CKMBINDEX, TROPONINI in the last 168 hours. BNP (last 3 results) No results for input(s): PROBNP in the last 8760 hours. HbA1C: No results for input(s): HGBA1C in the last 72 hours. CBG: No results for input(s): GLUCAP in the last 168 hours. Lipid Profile: No results for input(s): CHOL, HDL, LDLCALC, TRIG, CHOLHDL, LDLDIRECT in the last 72 hours. Thyroid Function Tests: No results for input(s): TSH, T4TOTAL, FREET4, T3FREE, THYROIDAB in the last 72 hours. Anemia Panel: No results for input(s): VITAMINB12, FOLATE, FERRITIN, TIBC, IRON, RETICCTPCT in the last 72 hours.  --------------------------------------------------------------------------------------------------------------- Urine analysis: No results found for: COLORURINE, APPEARANCEUR, LABSPEC, PHURINE, GLUCOSEU, HGBUR, BILIRUBINUR, KETONESUR, PROTEINUR, UROBILINOGEN, NITRITE, LEUKOCYTESUR    Imaging Results:    Dg Chest Portable 1 View  Result Date: 03/24/2019 CLINICAL DATA:  Chest pain. EXAM: PORTABLE CHEST 1 VIEW COMPARISON:  Radiograph 01/12/2015 FINDINGS: Chronic hyperinflation. Upper normal heart size with unchanged mediastinal contours. Vascular congestion. New interstitial and  bronchial thickening. No confluent airspace disease. No  large pleural effusion. No pneumothorax. IMPRESSION: 1. Vascular congestion and suggestion of mild pulmonary edema. 2. Chronic hyperinflation. Electronically Signed   By: Keith Rake M.D.   On: 03/24/2019 00:20   Ct Angio Chest/abd/pel For Dissection W And/or Wo Contrast  Result Date: 03/24/2019 CLINICAL DATA:  Chest pain radiating to upper back. Positive D-dimer. EXAM: CT ANGIOGRAPHY CHEST, ABDOMEN AND PELVIS TECHNIQUE: Multidetector CT imaging through the chest, abdomen and pelvis was performed using the standard protocol during bolus administration of intravenous contrast. Multiplanar reconstructed images and MIPs were obtained and reviewed to evaluate the vascular anatomy. CONTRAST:  159mL OMNIPAQUE IOHEXOL 350 MG/ML SOLN COMPARISON:  None. FINDINGS: CTA CHEST FINDINGS Cardiovascular: --Heart: The heart size is normal. There is nopericardial effusion. There are coronary artery calcifications. --Aorta: The course and caliber of the thoracic aorta are normal. There is mild aortic atherosclerotic calcification. Precontrast images show no aortic intramural hematoma. There is no blood pool, dissection or penetrating ulcer demonstrated on arterial phase postcontrast imaging. There is a conventional 3 vessel aortic arch branching pattern. The proximal arch vessels are widely patent. --Pulmonary Arteries: Contrast timing is optimized for preferential opacification of the aorta. Within that limitation, normal central pulmonary arteries. Mediastinum/Nodes: Level 4R lymph node measures 23 mm. There are multiple other subcentimeter mediastinal nodes. No axillary adenopathy. Normal course of the thoracic esophagus. The thyroid gland is normal. Lungs/Pleura: Mild biapical scarring. No pleural effusion or pneumothorax. No nodules or masses. Musculoskeletal: No chest wall abnormality. No acute osseous findings. Review of the MIP images confirms the above findings. CTA ABDOMEN AND PELVIS FINDINGS VASCULAR Aorta: Normal  caliber aorta without aneurysm, dissection, vasculitis or hemodynamically significant stenosis. There is moderate aortic atherosclerosis. Celiac: No aneurysm, dissection or hemodynamically significant stenosis. Normal branching pattern. SMA: Widely patent without dissection or stenosis. Renals: Paired right and single left renal arteries. No aneurysm, dissection, stenosis or evidence of fibromuscular dysplasia. IMA: Narrowing at the IMA origin, but otherwise normal. Inflow: No aneurysm, stenosis or dissection. Veins: Normal course and caliber of the major veins. Assessment is otherwise limited by the arterial dominant contrast phase. Review of the MIP images confirms the above findings. NON-VASCULAR Hepatobiliary: Normal hepatic contours and density. No visible biliary dilatation. Cholelithiasis without acute inflammation. Pancreas: Normal contours without ductal dilatation. No peripancreatic fluid collection. Spleen: Normal arterial phase splenic enhancement pattern. Adrenals/Urinary Tract: --Adrenal glands: Normal. --Right kidney/ureter: No hydronephrosis or perinephric stranding. No nephrolithiasis. No obstructing ureteral stones. --Left kidney/ureter: Parenchymal scarring near the lateral upper pole. --Urinary bladder: Unremarkable. Stomach/Bowel: --Stomach/Duodenum: No hiatal hernia or other gastric abnormality. Normal duodenal course and caliber. --Small bowel: No dilatation or inflammation. --Colon: Rectosigmoid diverticulosis without acute inflammation. --Appendix: Normal. Lymphatic:  No abdominal or pelvic lymphadenopathy. Reproductive: Normal prostate and seminal vesicles. Musculoskeletal. Multilevel degenerative disc disease and facet arthrosis. No bony spinal canal stenosis. Grade 1 anterolisthesis at L5-S1 due to bilateral L5 pars interarticularis defects. Other: None. Review of the MIP images confirms the above findings. IMPRESSION: 1. No acute aortic syndrome or other acute abnormality of the chest,  abdomen or pelvis. 2. Enlarged level 4R mediastinal lymph node, uncertain significance. 3. Grade 1 anterolisthesis at L5-S1 due to pars interarticularis defects. 4. Aortic atherosclerosis (ICD10-I70.0). Coronary artery calcification. Electronically Signed   By: Ulyses Jarred M.D.   On: 03/24/2019 02:17       Assessment & Plan:    Principal Problem:   Chest pain Active Problems:   HTN (hypertension)  Chest  pain Tele Trop I , Acosta lipid Acosta cardiac echo Cont aspirin Cont Cardizem CD 240mg  po qday Nitro paste 1/2 inch q8h Start Lipitor 80mg  po qhs Please consult cardiology in am  Hypertension Cont Cardizem CD as above Cont Hydrochlorothiazide 25mg  po qday  Esophageal stricture /Gerd Cont PPI  Glaucoma  Cont Xalatan  DVT Prophylaxis-   Lovenox - SCDs    AM Labs Ordered, also please review Full Orders  Family Communication: Admission, patients condition and plan of care including tests being ordered have been discussed with the patient  who indicate understanding and agree with the plan and Code Status.  Code Status:  FULL CODE, pt's son present w patient  Admission status: Observation : Based on patients clinical presentation and evaluation of above clinical data, I have made determination that patient meets observation criteria at this time.   Time spent in minutes : 55 minutes   Jani Gravel M.D on 03/24/2019 at 2:49 AM

## 2019-03-24 NOTE — Progress Notes (Signed)
PROGRESS NOTE    David Acosta  IOX:[id number] DOB: 1925/12/30 DOA: 03/23/2019 PCP: Christain Sacramento, MD    Brief Narrative:  85 year old male who presented with chest pain, he does have significant past medical history for hypertension, dysphasia and esophageal strictures.  He presented with acute onset of chest pain while lying in bed.  Precordial chest pain radiated from left to right, as well as to his back. Worse with deep inspiration and supine position, associated with lower extremity edema. He as noticed decrease physical capacity over last few weeks. Before this he was very active and able to do work in his yard with no angina.  On his initial physical examination his blood pressure was 113/73, heart rate 76, temperature 97.7, respiratory rate 16, oxygen saturation 96%, his lungs are clear to auscultation bilaterally, heart S1-S2 present and rhythmic, his abdomen was soft and nontender, no lower extremity edema. Sodium 136, potassium 3.7, chloride 100, bicarb 28, glucose 117, BUN 16, creatinine 0.98, troponin I (HI) 7, white count 10.2, hemoglobin 12.5, hematocrit 38.8, platelets 213.  SARS COVID-19 negative.  X-ray with increased lung markings bilaterally, CT chest and abdomen with contrast with no aortic dissection.  EKG 82 bpm, left axis deviation, normal intervals, sinus rhythm with PAC with aberrancy and PVC, no ST segment or T wave changes.  Patient was admitted to the hospital with working diagnosis of chest pain to rule out acute coronary syndrome  Assessment & Plan:   Principal Problem:   Acute diastolic CHF (congestive heart failure) (HCC) Active Problems:   HTN (hypertension)   Chest pain   Unspecified atrial fibrillation (HCC)   Hypokalemia    1. Acute diastolic heart failure. Patient describes heart failure symptoms, including decrease physical exertion due to dyspnea, lower extremity edema and orthopnea. Had furosemide last night with improvement of his symptoms but  not yet back to baseline. Will continue diuresis with furosemide 40 mg IV q12, strict in and out and daily weight. Follow on echocardiogram.  2. Atypical chest pain. No further pain, cardiac markers are negative, ekg with no ischemic changes. Patient ruled out for acute coronary syndrome.   3. New onset atrial fibrillation. Telemetry monitor with atrial fibrillation, will continue diltiazem for now for rate control and will wait for echocardiogram results. ChadsVasc score 4. Due to age, htn and now heart failure. Will start anticoagulation with apixaban and continue telemetry monitoring.  4. HTN. Continue blood pressure control with hctz and diltiazem for now.   5. GERD. Continue antiacid with pantoprazole, advance diet to heart healthy.   6. Hypokalemia. Continue k correction with Kcl.   DVT prophylaxis: apixaban   Code Status: full Family Communication: I spoke with patient's daughter at the bedside and all questions were addressed.  Disposition Plan/ discharge barriers: Pending clinical improvement, patient with risk of worsening heart failure, will change to inpatient, cardiac telemetry.   Body mass index is 27.84 kg/m. Malnutrition Type:      Malnutrition Characteristics:      Nutrition Interventions:     RN Pressure Injury Documentation:     Consultants:     Procedures:     Antimicrobials:       Subjective: Patient feeling better but not back to his baseline, no further chest pain, no nausea or vomiting. Telemetry now in atrial fibrillation rhythm.   Objective: Vitals:   03/24/19 0758 03/24/19 0800 03/24/19 0857 03/24/19 1127  BP: 109/65 109/61 126/65 (!) 108/59  Pulse: 66 67    Resp:  16 18  17   Temp: 97.8 F (36.6 C)  98 F (36.7 C) 97.8 F (36.6 C)  TempSrc: Oral  Oral Oral  SpO2: 96% 95% 95% 95%  Weight:      Height:       No intake or output data in the 24 hours ending 03/24/19 1158 Filed Weights   03/23/19 2351  Weight: 88 kg     Examination:   General: Not in pain or dyspnea Neurology: Awake and alert, non focal  E ENT: mild pallor, no icterus, oral mucosa moist Cardiovascular: No JVD. S1-S2 present, rhythmic, no gallops, rubs, or murmurs. ++/+++ pitting bilateral lower extremity edema. Pulmonary: positive breath sounds bilaterally, decreased breath sounds at bases, no wheezing, rhonchi or rales. Gastrointestinal. Abdomen with no organomegaly, non tender, no rebound or guarding Skin. No rashes Musculoskeletal: no joint deformities     Data Reviewed: I have personally reviewed following labs and imaging studies  CBC: Recent Labs  Lab 03/24/19 0016 03/24/19 0713  WBC 10.2 9.4  HGB 12.5* 12.4*  HCT 38.8* 38.6*  MCV 100.3* 100.3*  PLT 213 568   Basic Metabolic Panel: Recent Labs  Lab 03/24/19 0016 03/24/19 0713  NA 136 136  K 3.7 3.2*  CL 100 98  CO2 28 29  GLUCOSE 117* 118*  BUN 16 16  CREATININE 0.98 0.99  CALCIUM 8.5* 8.4*   GFR: Estimated Creatinine Clearance: 55.4 mL/min (by C-G formula based on SCr of 0.99 mg/dL). Liver Function Tests: Recent Labs  Lab 03/24/19 0713  AST 17  ALT 13  ALKPHOS 77  BILITOT 0.8  PROT 6.7  ALBUMIN 3.3*   No results for input(s): LIPASE, AMYLASE in the last 168 hours. No results for input(s): AMMONIA in the last 168 hours. Coagulation Profile: No results for input(s): INR, PROTIME in the last 168 hours. Cardiac Enzymes: No results for input(s): CKTOTAL, CKMB, CKMBINDEX, TROPONINI in the last 168 hours. BNP (last 3 results) No results for input(s): PROBNP in the last 8760 hours. HbA1C: No results for input(s): HGBA1C in the last 72 hours. CBG: No results for input(s): GLUCAP in the last 168 hours. Lipid Profile: Recent Labs    03/24/19 0713  CHOL 135  HDL 52  LDLCALC 75  TRIG 41  CHOLHDL 2.6   Thyroid Function Tests: No results for input(s): TSH, T4TOTAL, FREET4, T3FREE, THYROIDAB in the last 72 hours. Anemia Panel: No results for  input(s): VITAMINB12, FOLATE, FERRITIN, TIBC, IRON, RETICCTPCT in the last 72 hours.    Radiology Studies: I have reviewed all of the imaging during this hospital visit personally     Scheduled Meds: . aspirin EC  81 mg Oral Daily  . atorvastatin  80 mg Oral q1800  . diltiazem  240 mg Oral Daily  . enoxaparin (LOVENOX) injection  40 mg Subcutaneous Q24H  . hydrochlorothiazide  25 mg Oral Daily  . latanoprost  1 drop Both Eyes QHS  . multivitamin  1 tablet Oral Daily  . nitroGLYCERIN  0.5 inch Topical Q8H  . pantoprazole  40 mg Oral QAC breakfast   Continuous Infusions:   LOS: 0 days        Saray Capasso Gerome Apley, MD

## 2019-03-24 NOTE — ED Notes (Signed)
Family updated about room assignment and transfer status and given number to [facility] at [facility].

## 2019-03-24 NOTE — ED Notes (Signed)
Report given to [facility] with [facility]. ETA 10-15 minutes.

## 2019-03-24 NOTE — ED Notes (Signed)
Called [facility] for transport 

## 2019-03-24 NOTE — Progress Notes (Signed)
  Echocardiogram 2D Echocardiogram has been performed.  David Acosta 03/24/2019, 11:33 AM

## 2019-03-25 LAB — BASIC METABOLIC PANEL
Anion gap: 10 (ref 5–15)
BUN: 15 mg/dL (ref 8–23)
CO2: 27 mmol/L (ref 22–32)
Calcium: 8.2 mg/dL — ABNORMAL LOW (ref 8.9–10.3)
Chloride: 99 mmol/L (ref 98–111)
Creatinine, Ser: 1.13 mg/dL (ref 0.61–1.24)
GFR calc Af Amer: >60 mL/min (ref >60)
GFR calc non Af Amer: 57 mL/min — ABNORMAL LOW (ref >60)
Glucose, Bld: 109 mg/dL — ABNORMAL HIGH (ref 70–99)
Potassium: 2.8 mmol/L — ABNORMAL LOW (ref 3.5–5.1)
Sodium: 136 mmol/L (ref 135–145)

## 2019-03-25 MED ORDER — POTASSIUM CHLORIDE CRYS ER 20 MEQ PO TBCR
40.0000 meq | EXTENDED_RELEASE_TABLET | Freq: Two times a day (BID) | ORAL | Status: DC
  Administered 2019-03-25 – 2019-03-26 (×3): 40 meq via ORAL
  Filled 2019-03-25 (×3): qty 2

## 2019-03-25 MED ORDER — ALPRAZOLAM 0.25 MG PO TABS
0.2500 mg | ORAL_TABLET | Freq: Once | ORAL | Status: AC
  Administered 2019-03-25: 0.25 mg via ORAL
  Filled 2019-03-25: qty 1

## 2019-03-25 NOTE — Plan of Care (Signed)
  Problem: Education: Goal: Knowledge of General Education information will improve Description: Including pain rating scale, medication(s)/side effects and non-pharmacologic comfort measures Outcome: Progressing   Problem: Clinical Measurements: Goal: Cardiovascular complication will be avoided Outcome: Progressing   Problem: Clinical Measurements: Goal: Ability to maintain clinical measurements within normal limits will improve Outcome: Progressing

## 2019-03-26 DIAGNOSIS — I5033 Acute on chronic diastolic (congestive) heart failure: Secondary | ICD-10-CM

## 2019-03-26 LAB — BASIC METABOLIC PANEL
Anion gap: 13 (ref 5–15)
BUN: 21 mg/dL (ref 8–23)
CO2: 26 mmol/L (ref 22–32)
Calcium: 8.5 mg/dL — ABNORMAL LOW (ref 8.9–10.3)
Chloride: 97 mmol/L — ABNORMAL LOW (ref 98–111)
Creatinine, Ser: 1.35 mg/dL — ABNORMAL HIGH (ref 0.61–1.24)
GFR calc Af Amer: 53 mL/min — ABNORMAL LOW (ref >60)
GFR calc non Af Amer: 46 mL/min — ABNORMAL LOW (ref >60)
Glucose, Bld: 151 mg/dL — ABNORMAL HIGH (ref 70–99)
Potassium: 3.8 mmol/L (ref 3.5–5.1)
Sodium: 136 mmol/L (ref 135–145)

## 2019-03-26 MED ORDER — APIXABAN 5 MG PO TABS: 5.0000 mg | ORAL_TABLET | Freq: Two times a day (BID) | ORAL | 0 refills | Status: DC

## 2019-03-26 MED ORDER — POTASSIUM CHLORIDE CRYS ER 20 MEQ PO TBCR: 20.0000 meq | EXTENDED_RELEASE_TABLET | Freq: Every day | ORAL | 0 refills | Status: DC

## 2019-03-26 MED ORDER — POTASSIUM CHLORIDE CRYS ER 20 MEQ PO TBCR
40.0000 meq | EXTENDED_RELEASE_TABLET | Freq: Once | ORAL | Status: AC
  Administered 2019-03-26: 10:00:00 40 meq via ORAL
  Filled 2019-03-26: qty 2

## 2019-03-26 MED ORDER — POTASSIUM CHLORIDE CRYS ER 20 MEQ PO TBCR: 20.0000 meq | EXTENDED_RELEASE_TABLET | Freq: Every day | ORAL | Status: DC

## 2019-03-26 MED ORDER — FUROSEMIDE 40 MG PO TABS: 40.0000 mg | ORAL_TABLET | Freq: Every day | ORAL | Status: DC

## 2019-03-26 MED ORDER — FUROSEMIDE 40 MG PO TABS: 40.0000 mg | ORAL_TABLET | Freq: Every day | ORAL | 0 refills | Status: DC

## 2019-03-26 MED FILL — ELIQUIS 5 MG TABLET: 5 | 30 days supply | Qty: 60 | Fill #0

## 2019-03-26 MED FILL — FUROSEMIDE 40 MG TABLET: 40 | 30 days supply | Qty: 30 | Fill #0

## 2019-03-26 MED FILL — POTASSIUM CL ER 20 MEQ TABL: 20 | 30 days supply | Qty: 30 | Fill #0

## 2019-03-26 NOTE — Discharge Instructions (Signed)

## 2019-03-26 NOTE — TOC Benefit Eligibility Note (Signed)
Transition of Care [facility]) Benefit Eligibility Note    Patient Details  Name: David Acosta MRN: [id number] Date of Birth: 1926/02/03   Medication/Dose: Eliquis (apixaban) 2.5mg  bid or 5mg  bid  Covered?: Yes  Tier: 3 Drug  Prescription Coverage Preferred Pharmacy: Any retail Pharmacy  Spoke with Person/Company/Phone Number:: [employer]  Co-Pay: See Note  Prior Approval: No  Deductible: (Not able to obtain)  Additional Notes: Eliquis (apixaban) 2.5mg  bid for 30 day supply 45.00 retail 90 day supply mail order 120.  or 5mg  bid  30 day supply 45.00 90 day mail order 125.00    Orbie Pyo Phone Number: 03/26/2019, 9:00 AM

## 2019-03-26 NOTE — TOC Transition Note (Signed)
Transition of Care [facility]) - [name]   Patient Details  Name: David Acosta MRN: [id number] Date of Birth: 08-Dec-1925  [facility]) [name]:  Zenon Mayo, RN Phone Number: 03/26/2019, 1:11 PM   Clinical Narrative:    Patient for dc today, will be on eliquis, NCM informed his son who is at bedside the refill cost will be 45.00 for eliquis.  He states they have the 30 day free filled already.   Final next level of care: Home/Self Care Barriers to Discharge: No Barriers Identified   Patient Goals and CMS Choice Patient states their goals for this hospitalization and ongoing recovery are:: get better   Choice offered to / list presented to : NA  Discharge Placement                       Discharge Plan and Services                DME Arranged: (NA)         HH Arranged: NA          Social Determinants of Health (SDOH) Interventions     Readmission Risk Interventions No flowsheet data found.

## 2019-03-26 NOTE — Evaluation (Signed)
Physical Therapy Evaluation Patient Details Name: David Acosta MRN: [id number] DOB: [birth year] Today's Date: 03/26/2019   History of Present Illness  pt is a 85 y/o male presenting with chest pain.  Work up included acute on chronic diastolic HF and new onset of afib.  Clinical Impression  Pt is at or close to baseline functioning and should be safe at home with family assist.  We discuss that it was time to increase his mobility.. There are no further acute PT needs.  Will sign off at this time.     Follow Up Recommendations No PT follow up    Equipment Recommendations  None recommended by PT    Recommendations for Other Services       Precautions / Restrictions Precautions Precautions: (minimal fall risk)      Mobility  Bed Mobility Overal bed mobility: Independent                Transfers Overall transfer level: Independent                  Ambulation/Gait Ambulation/Gait assistance: Independent(in a home-like environment) Gait Distance (Feet): 200 Feet Assistive device: None Gait Pattern/deviations: Step-through pattern Gait velocity: slower Gait velocity interpretation: 1.31 - 2.62 ft/sec, indicative of limited community ambulator General Gait Details: mildly stiff with episodes of unsteady, but without significant deviation.  Stairs Stairs: Yes Stairs assistance: Modified independent (Device/Increase time) Stair Management: One rail Right;Step to pattern;Forwards Number of Stairs: 3 General stair comments: safe with the rail with a step to pattern  Wheelchair Mobility    Modified Rankin (Stroke Patients Only)       Balance Overall balance assessment: Mild deficits observed, not formally tested;Needs assistance   Sitting balance-Leahy Scale: Good       Standing balance-Leahy Scale: Fair                               Pertinent Vitals/Pain Pain Assessment: No/denies pain    Home Living Family/patient expects to be  discharged to:: Private residence Living Arrangements: Spouse/significant other;Children Available Help at Discharge: Family;Available 24 hours/day Type of Home: House Home Access: Stairs to enter Entrance Stairs-Rails: Psychiatric nurse of Steps: 2 Home Layout: One level Home Equipment: Cane - single point      Prior Function Level of Independence: Independent;Independent with assistive device(s)               Hand Dominance        Extremity/Trunk Assessment   Upper Extremity Assessment Upper Extremity Assessment: Overall WFL for tasks assessed    Lower Extremity Assessment Lower Extremity Assessment: Overall WFL for tasks assessed       Communication   Communication: No difficulties  Cognition Arousal/Alertness: Awake/alert Behavior During Therapy: WFL for tasks assessed/performed Overall Cognitive Status: Within Functional Limits for tasks assessed                                        General Comments      Exercises     Assessment/Plan    PT Assessment Patent does not need any further PT services  PT Problem List         PT Treatment Interventions      PT Goals (Current goals can be found in the Care Plan section)  Acute Rehab PT Goals Patient Stated Goal:  get home PT Goal Formulation: With patient    Frequency     Barriers to discharge        Co-evaluation               AM-PAC PT "6 Clicks" Mobility  Outcome Measure Help needed turning from your back to your side while in a flat bed without using bedrails?: None Help needed moving from lying on your back to sitting on the side of a flat bed without using bedrails?: None Help needed moving to and from a bed to a chair (including a wheelchair)?: None Help needed standing up from a chair using your arms (e.g., wheelchair or bedside chair)?: None Help needed to walk in hospital room?: None Help needed climbing 3-5 steps with a railing? : None 6  Click Score: 24    End of Session   Activity Tolerance: Patient tolerated treatment well Patient left: in bed;with call bell/phone within reach;with family/visitor present;Other (comment)(sitting EOB) Nurse Communication: Mobility status PT Visit Diagnosis: Other abnormalities of gait and mobility (R26.89);Difficulty in walking, not elsewhere classified (R26.2)    Time: 9458-5929 PT Time Calculation (min) (ACUTE ONLY): 19 min   Charges:   PT Evaluation $PT Eval Low Complexity: 1 Low          03/26/2019  Donnella Sham, PT Acute Rehabilitation Services [phone]  (pager) [phone]  (office)  [name] 03/26/2019, 6:17 PM

## 2019-03-26 NOTE — Discharge Summary (Addendum)
Physician Discharge Summary  MARDELL CRAGG UJW:[id number] DOB: 12-30-1925 DOA: 03/23/2019  PCP: Christain Sacramento, MD  Admit date: 03/23/2019 Discharge date: 03/26/2019  Admitted From: Home  Disposition:   Home   Recommendations for Outpatient Follow-up and new medication changes:  1. Follow up with Dr. Redmond Pulling in 7 days.  2. Patient started on furosemide 40 mg po daily. 3. Anticoagulation with apixaban.  4. Continue diltiazem for heart rate control.  5. Follow up renal panel in 2 days.   Home Health: no   Equipment/Devices: no    Discharge Condition: stable  CODE STATUS: full  Diet recommendation: heart healthy   Brief/Interim Summary: 85 year old male who presented with chest pain, he does have significant past medical history for hypertension, dysphasiaandesophageal strictures.He presented with acute onset of chest pain while lying in bed. Precordial chest pain radiated from left to right, as well as to his back. Worse with deep inspiration and supine position, associated with lower extremity edema. He as noticed decrease physical capacity over last few weeks. Before this he was very active and able to do work in his yard with no angina.On his initial physical examination his blood pressure was 113/73, heart rate 76, temperature 97.7, respiratory rate 16, oxygen saturation 96%, his lungs were clear to auscultation bilaterally, heart S1-S2 present and rhythmic, his abdomen was soft and nontender, positive +++ pitting lower extremity edema. Sodium 136, potassium 3.7, chloride 100, bicarb 28, glucose 117, BUN 16, creatinine 0.98, troponin I(HI) 7,white count 10.2, hemoglobin 12.5, hematocrit 38.8, platelets 213.SARS COVID-19 negative.X-ray with increased lung markings bilaterally, CT chest and abdomen with contrast with no aortic dissection. EKG 82 bpm, left axis deviation, normal intervals, sinus rhythm with PAC with aberrancy and PVC, no ST segment or T wave changes.  Patient  was admitted to the hospital with working diagnosis of chest pain to rule out acute coronary syndrome, complicated with volume overload.   1.  Acute on chronic diastolic heart failure.  Patient was admitted to the cardiac telemetry unit, he received intravenous furosemide, negative fluid balance was achieved (-3935 ml since admission) with significant improvement of his symptoms.  Further work-up with echocardiography had a normal left ventricle systolic function, ejection fraction 60 to 65%, and trivial pericardial effusion.  Patient will continue diuresis with furosemide 40 mg daily, follow-up as an outpatient.  2.  New onset atrial fibrillation.  His telemetry monitor showed atrial fibrillation, confirmed by 12-lead electrocardiogram.  Patient does have elevated chads Vasc scoreup to 4, he was placed on anticoagulation with apixaban, continue rate control with diltiazem.  Will need close follow-up as an outpatient.  3.  Hypertension.  Blood pressure been well controlled, continue diltiazem.  Hydrochlorothiazide has been discontinued.  4.  Hypokalemia with AKI.  Likely diuretic induced, patient had potassium correction, continue 20 mEq potassium chloride daily.  Follow-up kidney function as an outpatient within 2  days.  5.  GERD.  Continue pantoprazole for antiacid therapy.   Discharge Diagnoses:  Principal Problem:   Acute diastolic CHF (congestive heart failure) (HCC) Active Problems:   HTN (hypertension)   Chest pain   Unspecified atrial fibrillation (HCC)   Hypokalemia   Diastolic heart failure ([place])    Discharge Instructions   Allergies as of 03/26/2019      Reactions   Capsaicin Other (See Comments)   "heart started to race and blood pressure went up"   Fluticasone Other (See Comments)   glaucoma   Procardia Xl [nifedipine] Other (See Comments)  Rapid heart beat   Timolol Swelling      Medication List    STOP taking these medications   aspirin EC 81 MG tablet    hydrochlorothiazide 25 MG tablet Commonly known as: HYDRODIURIL   pantoprazole 40 MG tablet Commonly known as: PROTONIX     TAKE these medications   ALPRAZolam 0.5 MG tablet Commonly known as: XANAX Take 0.5 mg by mouth 2 (two) times daily as needed for anxiety.   apixaban 5 MG Tabs tablet Commonly known as: ELIQUIS Take 1 tablet (5 mg total) by mouth 2 (two) times daily.   diltiazem 240 MG 24 hr capsule Commonly known as: CARDIZEM CD Take 240 mg by mouth daily.   furosemide 40 MG tablet Commonly known as: LASIX Take 1 tablet (40 mg total) by mouth daily.   latanoprost 0.005 % ophthalmic solution Commonly known as: XALATAN Place 1 drop into both eyes at bedtime.   potassium chloride SA 20 MEQ tablet Commonly known as: K-DUR Take 1 tablet (20 mEq total) by mouth daily. Start taking on: March 27, 2019   PreserVision AREDS 2 Chew Chew 1 each by mouth daily.   ranitidine 150 MG capsule Commonly known as: ZANTAC Take 1 capsule (150 mg total) by mouth 2 (two) times daily.       Allergies  Allergen Reactions  . Capsaicin Other (See Comments)    "heart started to race and blood pressure went up"  . Fluticasone Other (See Comments)    glaucoma  . Procardia Xl [Nifedipine] Other (See Comments)    Rapid heart beat  . Timolol Swelling    Consultations:     Procedures/Studies: Dg Chest Portable 1 View  Result Date: 03/24/2019 CLINICAL DATA:  Chest pain. EXAM: PORTABLE CHEST 1 VIEW COMPARISON:  Radiograph 01/12/2015 FINDINGS: Chronic hyperinflation. Upper normal heart size with unchanged mediastinal contours. Vascular congestion. New interstitial and bronchial thickening. No confluent airspace disease. No large pleural effusion. No pneumothorax. IMPRESSION: 1. Vascular congestion and suggestion of mild pulmonary edema. 2. Chronic hyperinflation. Electronically Signed   By: Keith Rake M.D.   On: 03/24/2019 00:20   Ct Angio Chest/abd/pel For Dissection W  And/or Wo Contrast  Result Date: 03/24/2019 CLINICAL DATA:  Chest pain radiating to upper back. Positive D-dimer. EXAM: CT ANGIOGRAPHY CHEST, ABDOMEN AND PELVIS TECHNIQUE: Multidetector CT imaging through the chest, abdomen and pelvis was performed using the standard protocol during bolus administration of intravenous contrast. Multiplanar reconstructed images and MIPs were obtained and reviewed to evaluate the vascular anatomy. CONTRAST:  185mL OMNIPAQUE IOHEXOL 350 MG/ML SOLN COMPARISON:  None. FINDINGS: CTA CHEST FINDINGS Cardiovascular: --Heart: The heart size is normal. There is nopericardial effusion. There are coronary artery calcifications. --Aorta: The course and caliber of the thoracic aorta are normal. There is mild aortic atherosclerotic calcification. Precontrast images show no aortic intramural hematoma. There is no blood pool, dissection or penetrating ulcer demonstrated on arterial phase postcontrast imaging. There is a conventional 3 vessel aortic arch branching pattern. The proximal arch vessels are widely patent. --Pulmonary Arteries: Contrast timing is optimized for preferential opacification of the aorta. Within that limitation, normal central pulmonary arteries. Mediastinum/Nodes: Level 4R lymph node measures 23 mm. There are multiple other subcentimeter mediastinal nodes. No axillary adenopathy. Normal course of the thoracic esophagus. The thyroid gland is normal. Lungs/Pleura: Mild biapical scarring. No pleural effusion or pneumothorax. No nodules or masses. Musculoskeletal: No chest wall abnormality. No acute osseous findings. Review of the MIP images confirms the above findings. CTA  ABDOMEN AND PELVIS FINDINGS VASCULAR Aorta: Normal caliber aorta without aneurysm, dissection, vasculitis or hemodynamically significant stenosis. There is moderate aortic atherosclerosis. Celiac: No aneurysm, dissection or hemodynamically significant stenosis. Normal branching pattern. SMA: Widely patent  without dissection or stenosis. Renals: Paired right and single left renal arteries. No aneurysm, dissection, stenosis or evidence of fibromuscular dysplasia. IMA: Narrowing at the IMA origin, but otherwise normal. Inflow: No aneurysm, stenosis or dissection. Veins: Normal course and caliber of the major veins. Assessment is otherwise limited by the arterial dominant contrast phase. Review of the MIP images confirms the above findings. NON-VASCULAR Hepatobiliary: Normal hepatic contours and density. No visible biliary dilatation. Cholelithiasis without acute inflammation. Pancreas: Normal contours without ductal dilatation. No peripancreatic fluid collection. Spleen: Normal arterial phase splenic enhancement pattern. Adrenals/Urinary Tract: --Adrenal glands: Normal. --Right kidney/ureter: No hydronephrosis or perinephric stranding. No nephrolithiasis. No obstructing ureteral stones. --Left kidney/ureter: Parenchymal scarring near the lateral upper pole. --Urinary bladder: Unremarkable. Stomach/Bowel: --Stomach/Duodenum: No hiatal hernia or other gastric abnormality. Normal duodenal course and caliber. --Small bowel: No dilatation or inflammation. --Colon: Rectosigmoid diverticulosis without acute inflammation. --Appendix: Normal. Lymphatic:  No abdominal or pelvic lymphadenopathy. Reproductive: Normal prostate and seminal vesicles. Musculoskeletal. Multilevel degenerative disc disease and facet arthrosis. No bony spinal canal stenosis. Grade 1 anterolisthesis at L5-S1 due to bilateral L5 pars interarticularis defects. Other: None. Review of the MIP images confirms the above findings. IMPRESSION: 1. No acute aortic syndrome or other acute abnormality of the chest, abdomen or pelvis. 2. Enlarged level 4R mediastinal lymph node, uncertain significance. 3. Grade 1 anterolisthesis at L5-S1 due to pars interarticularis defects. 4. Aortic atherosclerosis (ICD10-I70.0). Coronary artery calcification. Electronically Signed    By: Ulyses Jarred M.D.   On: 03/24/2019 02:17      Procedures:   Subjective: Patient is feeling better, no further dyspnea or chest pain, back to his normal baseline.   Discharge Exam: Vitals:   03/26/19 0347 03/26/19 0836  BP: 121/71   Pulse: 74 67  Resp: 19   Temp: 97.6 F (36.4 C) 97.7 F (36.5 C)  SpO2: 92% 93%   Vitals:   03/26/19 0347 03/26/19 0500 03/26/19 0548 03/26/19 0836  BP: 121/71     Pulse: 74   67  Resp: 19     Temp: 97.6 F (36.4 C)   97.7 F (36.5 C)  TempSrc: Oral   Oral  SpO2: 92%   93%  Weight:  86.9 kg 86.9 kg   Height:        General: Not in pain or dyspnea  Neurology: Awake and alert, non focal  E ENT: no pallor, no icterus, oral mucosa moist Cardiovascular: No JVD. S1-S2 present, irregularly irregular with no gallops, rubs, or murmurs. Trace lower extremity edema. Pulmonary: vesicular breath sounds bilaterally, adequate air movement, no wheezing, rhonchi or rales. Gastrointestinal. Abdomen with, no organomegaly, non tender, no rebound or guarding Skin. No rashes Musculoskeletal: no joint deformities   The results of significant diagnostics from this hospitalization (including imaging, microbiology, ancillary and laboratory) are listed below for reference.     Microbiology: Recent Results (from the past 240 hour(s))  SARS Coronavirus 2 [facility] order, Performed in [facility] hospital lab) Nasopharyngeal Nasopharyngeal Swab     Status: None   Collection Time: 03/24/19 12:19 AM   Specimen: Nasopharyngeal Swab  Result Value Ref Range Status   SARS Coronavirus 2 NEGATIVE NEGATIVE Final    Comment: (NOTE) If result is NEGATIVE SARS-CoV-2 target nucleic acids are NOT DETECTED. The SARS-CoV-2  RNA is generally detectable in upper and lower  respiratory specimens during the acute phase of infection. The lowest  concentration of SARS-CoV-2 viral copies this assay can detect is 250  copies / mL. A negative result does not preclude SARS-CoV-2  infection  and should not be used as the sole basis for treatment or other  patient management decisions.  A negative result may occur with  improper specimen collection / handling, submission of specimen other  than nasopharyngeal swab, presence of viral mutation(s) within the  areas targeted by this assay, and inadequate number of viral copies  (<250 copies / mL). A negative result must be combined with clinical  observations, patient history, and epidemiological information. If result is POSITIVE SARS-CoV-2 target nucleic acids are DETECTED. The SARS-CoV-2 RNA is generally detectable in upper and lower  respiratory specimens dur ing the acute phase of infection.  Positive  results are indicative of active infection with SARS-CoV-2.  Clinical  correlation with patient history and other diagnostic information is  necessary to determine patient infection status.  Positive results do  not rule out bacterial infection or co-infection with other viruses. If result is PRESUMPTIVE POSTIVE SARS-CoV-2 nucleic acids MAY BE PRESENT.   A presumptive positive result was obtained on the submitted specimen  and confirmed on repeat testing.  While 2019 novel coronavirus  (SARS-CoV-2) nucleic acids may be present in the submitted sample  additional confirmatory testing may be necessary for epidemiological  and / or clinical management purposes  to differentiate between  SARS-CoV-2 and other Sarbecovirus currently known to infect humans.  If clinically indicated additional testing with an alternate test  methodology [phone]) is advised. The SARS-CoV-2 RNA is generally  detectable in upper and lower respiratory sp ecimens during the acute  phase of infection. The expected result is Negative. Fact Sheet for Patients:  StrictlyIdeas.no Fact Sheet for Healthcare Providers: BankingDealers.co.za This test is not yet approved or cleared by the [place]  FDA and has been authorized for detection and/or diagnosis of SARS-CoV-2 by FDA under an Emergency Use Authorization (EUA).  This EUA will remain in effect (meaning this test can be used) for the duration of the COVID-19 declaration under Section 564(b)(1) of the Act, 21 U.S.C. section 360bbb-3(b)(1), unless the authorization is terminated or revoked sooner. Performed at [facility], [address] [place], [place] [address]   MRSA PCR Screening     Status: None   Collection Time: 03/24/19  9:06 AM   Specimen: Nasal Mucosa; Nasopharyngeal  Result Value Ref Range Status   MRSA by PCR NEGATIVE NEGATIVE Final    Comment:        The GeneXpert MRSA Assay (FDA approved for NASAL specimens only), is one component of a comprehensive MRSA colonization surveillance program. It is not intended to diagnose MRSA infection nor to guide or monitor treatment for MRSA infections. Performed at [facility], [place] [address] [place], [place] [address]      Labs: BNP (last 3 results) Recent Labs    03/24/19 0018  BNP 149.7*   Basic Metabolic Panel: Recent Labs  Lab 03/24/19 0016 03/24/19 0713 03/25/19 0206  NA 136 136 136  K 3.7 3.2* 2.8*  CL 100 98 99  CO2 28 29 27   GLUCOSE 117* 118* 109*  BUN 16 16 15   CREATININE 0.98 0.99 1.13  CALCIUM 8.5* 8.4* 8.2*   Liver Function Tests: Recent Labs  Lab 03/24/19 0713  AST 17  ALT 13  ALKPHOS 77  BILITOT 0.8  PROT 6.7  ALBUMIN 3.3*   No results for input(s): LIPASE, AMYLASE in the last 168 hours. No results for input(s): AMMONIA in the last 168 hours. CBC: Recent Labs  Lab 03/24/19 0016 03/24/19 0713  WBC 10.2 9.4  HGB 12.5* 12.4*  HCT 38.8* 38.6*  MCV 100.3* 100.3*  PLT 213 206   Cardiac Enzymes: No results for input(s): CKTOTAL, CKMB, CKMBINDEX, TROPONINI in the last 168 hours. BNP: Invalid input(s): POCBNP CBG: No results for input(s): GLUCAP in the last 168 hours. D-Dimer Recent Labs    03/24/19 0016   DDIMER 0.58*   Hgb A1c No results for input(s): HGBA1C in the last 72 hours. Lipid Profile Recent Labs    03/24/19 0713  CHOL 135  HDL 52  LDLCALC 75  TRIG 41  CHOLHDL 2.6   Thyroid function studies No results for input(s): TSH, T4TOTAL, T3FREE, THYROIDAB in the last 72 hours.  Invalid input(s): FREET3 Anemia work up No results for input(s): VITAMINB12, FOLATE, FERRITIN, TIBC, IRON, RETICCTPCT in the last 72 hours. Urinalysis No results found for: COLORURINE, APPEARANCEUR, LABSPEC, [place], GLUCOSEU, [place], [place], KETONESUR, PROTEINUR, UROBILINOGEN, NITRITE, LEUKOCYTESUR Sepsis Labs Invalid input(s): PROCALCITONIN,  WBC,  [place] Microbiology Recent Results (from the past 240 hour(s))  SARS Coronavirus 2 [facility] order, Performed in [facility] hospital lab) Nasopharyngeal Nasopharyngeal Swab     Status: None   Collection Time: 03/24/19 12:19 AM   Specimen: Nasopharyngeal Swab  Result Value Ref Range Status   SARS Coronavirus 2 NEGATIVE NEGATIVE Final    Comment: (NOTE) If result is NEGATIVE SARS-CoV-2 target nucleic acids are NOT DETECTED. The SARS-CoV-2 RNA is generally detectable in upper and lower  respiratory specimens during the acute phase of infection. The lowest  concentration of SARS-CoV-2 viral copies this assay can detect is 250  copies / mL. A negative result does not preclude SARS-CoV-2 infection  and should not be used as the sole basis for treatment or other  patient management decisions.  A negative result may occur with  improper specimen collection / handling, submission of specimen other  than nasopharyngeal swab, presence of viral mutation(s) within the  areas targeted by this assay, and inadequate number of viral copies  (<250 copies / mL). A negative result must be combined with clinical  observations, patient history, and epidemiological information. If result is POSITIVE SARS-CoV-2 target nucleic acids are DETECTED. The SARS-CoV-2  RNA is generally detectable in upper and lower  respiratory specimens dur ing the acute phase of infection.  Positive  results are indicative of active infection with SARS-CoV-2.  Clinical  correlation with patient history and other diagnostic information is  necessary to determine patient infection status.  Positive results do  not rule out bacterial infection or co-infection with other viruses. If result is PRESUMPTIVE POSTIVE SARS-CoV-2 nucleic acids MAY BE PRESENT.   A presumptive positive result was obtained on the submitted specimen  and confirmed on repeat testing.  While 2019 novel coronavirus  (SARS-CoV-2) nucleic acids may be present in the submitted sample  additional confirmatory testing may be necessary for epidemiological  and / or clinical management purposes  to differentiate between  SARS-CoV-2 and other Sarbecovirus currently known to infect humans.  If clinically indicated additional testing with an alternate test  methodology [phone]) is advised. The SARS-CoV-2 RNA is generally  detectable in upper and lower respiratory sp ecimens during the acute  phase of infection. The expected result is Negative. Fact Sheet for Patients:  StrictlyIdeas.no Fact  Sheet for Healthcare Providers: BankingDealers.co.za This test is not yet approved or cleared by the [place] and has been authorized for detection and/or diagnosis of SARS-CoV-2 by FDA under an Emergency Use Authorization (EUA).  This EUA will remain in effect (meaning this test can be used) for the duration of the COVID-19 declaration under Section 564(b)(1) of the Act, 21 U.S.C. section 360bbb-3(b)(1), unless the authorization is terminated or revoked sooner. Performed at [facility], [address] [place], [place] [address]   MRSA PCR Screening     Status: None   Collection Time: 03/24/19  9:06 AM   Specimen: Nasal Mucosa; Nasopharyngeal  Result Value Ref  Range Status   MRSA by PCR NEGATIVE NEGATIVE Final    Comment:        The GeneXpert MRSA Assay (FDA approved for NASAL specimens only), is one component of a comprehensive MRSA colonization surveillance program. It is not intended to diagnose MRSA infection nor to guide or monitor treatment for MRSA infections. Performed at [facility], [place] [address] [place], [place] [address]      Time coordinating discharge: 45 minutes  SIGNED:   Tawni Millers, MD  [facility] 03/26/2019, 11:30 AM

## 2019-03-28 NOTE — Progress Notes (Signed)
PROGRESS NOTE    David Acosta  LKG:[id number] DOB: 02-Mar-1926 DOA: 03/23/2019 PCP: Christain Sacramento, MD    Brief Narrative:  85 year old male who presented with chest pain, he does have significant past medical history for hypertension, dysphasiaandesophageal strictures.He presented with acute onset of chest pain while lying in bed. Precordial chest pain radiated from left to right, as well as to his back. Worse with deep inspiration and supine position, associated with lower extremity edema. He as noticed decrease physical capacity over last few weeks. Before this he was very active and able to do work in his yard with no angina.On his initial physical examination his blood pressure was 113/73, heart rate 76, temperature 97.7, respiratory rate 16, oxygen saturation 96%, his lungs were clear to auscultation bilaterally, heart S1-S2 present and rhythmic, his abdomen was soft and nontender, positive +++ pitting lower extremity edema. Sodium 136, potassium 3.7, chloride 100, bicarb 28, glucose 117, BUN 16, creatinine 0.98, troponin I(HI) 7,white count 10.2, hemoglobin 12.5, hematocrit 38.8, platelets 213.SARS COVID-19 negative.X-ray with increased lung markings bilaterally, CT chest and abdomen with contrast with no aortic dissection. EKG 82 bpm, left axis deviation, normal intervals, sinus rhythm with PAC with aberrancy and PVC, no ST segment or T wave changes.  Patient was admitted to the hospital with working diagnosis of chest pain to rule out acute coronary syndrome, complicated with volume overload.    Assessment & Plan:   Principal Problem:   Acute diastolic CHF (congestive heart failure) (HCC) Active Problems:   HTN (hypertension)   Chest pain   Unspecified atrial fibrillation (HCC)   Hypokalemia   Diastolic heart failure ([place])  1. Acute diastolic heart failure. Patient has been responding to diuresis but continue to be volume overloaded. Will continue diuresis with  furosemide to target a negative fluid balance and follow on echocardiography.   2. Atypical chest pain.  Patient has ruled out for acute coronary syndrome No further chest pain.   3. New onset atrial fibrillation. Continue rate control with diltiazem, high risk of thrombosis per CHadsVasc score of 4, will continue anticoagulation with apixaban with good toleration.  4. HTN. Blood pressure control with hctz and diltiazem.   5. GERD. On pantoprazole, tolerating po well.  6. Hypokalemia. Kcl for K correction.  DVT prophylaxis: apixaban   Code Status: full Family Communication: I spoke with patient's son at the bedside and all questions were addressed.  Body mass index is 27.49 kg/m. Malnutrition Type:      Malnutrition Characteristics:      Nutrition Interventions:     RN Pressure Injury Documentation:     Consultants:    Procedures:     Antimicrobials:     Subjective:  Patient is feeling better, improved dyspnea but not yet back to baseline, no chest pain, no nausea or vomiting.  Objective: Vitals:   03/26/19 0347 03/26/19 0500 03/26/19 0548 03/26/19 0836  BP: 121/71     Pulse: 74   67  Resp: 19     Temp: 97.6 F (36.4 C)   97.7 F (36.5 C)  TempSrc: Oral   Oral  SpO2: 92%   93%  Weight:  86.9 kg 86.9 kg   Height:       No intake or output data in the 24 hours ending 03/28/19 1035 Filed Weights   03/25/19 0616 03/26/19 0500 03/26/19 0548  Weight: 87.8 kg 86.9 kg 86.9 kg    Examination:   General: Not in pain or dyspnea  Neurology:  Awake and alert, non focal  E ENT: no pallor, no icterus, oral mucosa moist Cardiovascular: No JVD. S1-S2 present, irregularly irregular, no gallops, rubs, or murmurs. ++ pitting lower extremity edema. Pulmonary: vesicular breath sounds bilaterally, adequate air movement, no wheezing, rhonchi or rales. Gastrointestinal. Abdomen with, no organomegaly, non tender, no rebound or guarding Skin. No  rashes Musculoskeletal: no joint deformities     Data Reviewed: I have personally reviewed following labs and imaging studies  CBC: Recent Labs  Lab 03/24/19 0016 03/24/19 0713  WBC 10.2 9.4  HGB 12.5* 12.4*  HCT 38.8* 38.6*  MCV 100.3* 100.3*  PLT 213 841   Basic Metabolic Panel: Recent Labs  Lab 03/24/19 0016 03/24/19 0713 03/25/19 0206 03/26/19 1248  NA 136 136 136 136  K 3.7 3.2* 2.8* 3.8  CL 100 98 99 97*  CO2 28 29 27 26   GLUCOSE 117* 118* 109* 151*  BUN 16 16 15 21   CREATININE 0.98 0.99 1.13 1.35*  CALCIUM 8.5* 8.4* 8.2* 8.5*   GFR: Estimated Creatinine Clearance: 37.6 mL/min (A) (by C-G formula based on SCr of 1.35 mg/dL (H)). Liver Function Tests: Recent Labs  Lab 03/24/19 0713  AST 17  ALT 13  ALKPHOS 77  BILITOT 0.8  PROT 6.7  ALBUMIN 3.3*   No results for input(s): LIPASE, AMYLASE in the last 168 hours. No results for input(s): AMMONIA in the last 168 hours. Coagulation Profile: No results for input(s): INR, PROTIME in the last 168 hours. Cardiac Enzymes: No results for input(s): CKTOTAL, CKMB, CKMBINDEX, TROPONINI in the last 168 hours. BNP (last 3 results) No results for input(s): PROBNP in the last 8760 hours. HbA1C: No results for input(s): HGBA1C in the last 72 hours. CBG: No results for input(s): GLUCAP in the last 168 hours. Lipid Profile: No results for input(s): CHOL, HDL, LDLCALC, TRIG, CHOLHDL, LDLDIRECT in the last 72 hours. Thyroid Function Tests: No results for input(s): TSH, T4TOTAL, FREET4, T3FREE, THYROIDAB in the last 72 hours. Anemia Panel: No results for input(s): VITAMINB12, FOLATE, FERRITIN, TIBC, IRON, RETICCTPCT in the last 72 hours.    Radiology Studies: I have reviewed all of the imaging during this hospital visit personally     Scheduled Meds: Continuous Infusions:   LOS: 2 days        Wynston Romey Gerome Apley, MD

## 2019-05-11 NOTE — Progress Notes (Signed)
CARDIOLOGY CONSULT NOTE       Patient ID: AARUSH SAHAGIAN MRN: [id number] DOB/AGE: 1925-08-22 85 y.o.  Admit date: (Not on file) Referring Physician: Redmond Pulling Primary Physician: Christain Sacramento, MD Primary Cardiologist: New Reason for Consultation: Afib/CHF  Active Problems:   * No active hospital problems. *   HPI:  85 y.o. referred by Dr Redmond Pulling for afib and CHF. Last seen by me in 2016 Chronic PVCls since 1970's Normal cath in 2002. Normal ETT 2014 Holter only 3% PVC;s EF 60-65% by echo Did better on verapamil thought lopressor made him worse Admitted with chest pain precordial radiating to back. He had LE edema CT no dissection Thought to have diastolic CHF Rx with lasix. Echo with no significant valve disease and noted to have "new" afib on telemetry LA diameter on echo was 3.8 cm CXR with vascular congestion BNP only 147.  K 3.2 Cr 0.99 Hct 38.6 PLT 206 R/O  ECG to my review 03/25/19 showed SR with PAC/PVC Despite this he was started on eliquis for CHADVASC 4 D/c with eliquis 5 bid, lasix 40 mg daily KCL 20 mEq and cardizem 240 mg daily  Feels some better since d/c but still with edema. Denies chest pain Has fatigue    ROS All other systems reviewed and negative except as noted above  Past Medical History:  Diagnosis Date  . Dysphagia 10/05/2017  . Glaucoma   . Hypertension   . Macular degeneration   . Premature ventricular contraction 2013    Family History  Problem Relation Age of Onset  . Leukemia Mother   . Heart attack Father   . Heart failure Sister   . Heart attack Brother   . Uterine cancer Paternal Grandfather     Social History   Socioeconomic History  . Marital status: Married    Spouse name: Not on file  . Number of children: Not on file  . Years of education: Not on file  . Highest education level: Not on file  Occupational History  . Not on file  Social Needs  . Financial resource strain: Not on file  . Food insecurity    Worry: Not on file    Inability: Not on file  . Transportation needs    Medical: Not on file    Non-medical: Not on file  Tobacco Use  . Smoking status: Former Smoker    Quit date: 01/23/1959    Years since quitting: 60.3  . Smokeless tobacco: Never Used  Substance and Sexual Activity  . Alcohol use: Yes    Comment: daily, 2.7 oz   . Drug use: No  . Sexual activity: Not on file  Lifestyle  . Physical activity    Days per week: Not on file    Minutes per session: Not on file  . Stress: Not on file  Relationships  . Social Herbalist on phone: Not on file    Gets together: Not on file    Attends religious service: Not on file    Active member of club or organization: Not on file    Attends meetings of clubs or organizations: Not on file    Relationship status: Not on file  . Intimate partner violence    Fear of current or ex partner: Not on file    Emotionally abused: Not on file    Physically abused: Not on file    Forced sexual activity: Not on file  Other Topics Concern  .  Not on file  Social History Narrative  . Not on file    Past Surgical History:  Procedure Laterality Date  . CATARACT EXTRACTION W/PHACO Left 01/28/2014   Procedure: CATARACT EXTRACTION PHACO AND INTRAOCULAR LENS PLACEMENT (IOC);  Surgeon: Elta Guadeloupe T. Gershon Crane, MD;  Location: [facility];  Service: Ophthalmology;  Laterality: Left;  CDE 16.74  . CATARACT EXTRACTION W/PHACO Right 02/18/2014   Procedure: CATARACT EXTRACTION PHACO AND INTRAOCULAR LENS PLACEMENT (IOC);  Surgeon: Elta Guadeloupe T. Gershon Crane, MD;  Location: [facility];  Service: Ophthalmology;  Laterality: Right;  CDE 9.15  . cholesteoma mastoidectomy Right 2012  . Dupytrens contraction Right 2010  . ESOPHAGEAL DILATION N/A 11/23/2017   Procedure: ESOPHAGEAL DILATION;  Surgeon: Rogene Houston, MD;  Location: [facility];  Service: Endoscopy;  Laterality: N/A;  . ESOPHAGOGASTRODUODENOSCOPY N/A 11/23/2017   Procedure: ESOPHAGOGASTRODUODENOSCOPY (EGD);  Surgeon: Rogene Houston, MD;  Location: [facility];  Service: Endoscopy;  Laterality: N/A;  12:45  . NASAL SEPTUM SURGERY    . TONSILLECTOMY          Physical Exam:  BP 121/64   Pulse 75   Temp (!) 97.1 F (36.2 C) (Temporal)   Ht 5\' 10"  (1.778 m)   Wt 202 lb (91.6 kg)   SpO2 94%   BMI 28.98 kg/m  Affect appropriate Elderly white male  HEENT: normal Neck supple with no adenopathy JVP normal no bruits no thyromegaly Lungs clear with no wheezing and good diaphragmatic motion Heart:  S1/S2 no murmur, no rub, gallop or click PMI normal Abdomen: benighn, BS positve, no tenderness, no AAA no bruit.  No HSM or HJR Distal pulses intact with no bruits Plus 2 bilateral  edema Neuro non-focal Skin warm and dry No muscular weakness   Labs:   Lab Results  Component Value Date   WBC 9.4 03/24/2019   HGB 12.4 (L) 03/24/2019   HCT 38.6 (L) 03/24/2019   MCV 100.3 (H) 03/24/2019   PLT 206 03/24/2019   No results for input(s): NA, K, CL, CO2, BUN, CREATININE, CALCIUM, PROT, BILITOT, ALKPHOS, ALT, AST, GLUCOSE in the last 168 hours.  Invalid input(s): LABALBU Lab Results  Component Value Date   TROPONINI <0.30 01/09/2013    Lab Results  Component Value Date   CHOL 135 03/24/2019   Lab Results  Component Value Date   HDL 52 03/24/2019   Lab Results  Component Value Date   LDLCALC 75 03/24/2019   Lab Results  Component Value Date   TRIG 41 03/24/2019   Lab Results  Component Value Date   CHOLHDL 2.6 03/24/2019   No results found for: LDLDIRECT    Radiology: No results found.  EKG: SR PaC/PVC no afib ECG 05/14/19 afib    ASSESSMENT AND PLAN:   1. Arrhythmia:  PAF in afib today will get 48 hour holter to assess paroxysms and rate control continue cardiazem and eliquis   2. Diastolic CHF:  BNP only 123456 with normal EF by echo mild LVH and indeterminate diastolic evalvuation no significant valve disease D/c on lasix 20 mg dialy Now on toresemide 40 bid Still with LE edema Not  clear that some of this is from LE venous disease and appears more chronic with stasis changes Will f/u BMET and BNP and adjust diuretics as needed  3. Chest Pain:  R/o no history of CAD Normal cath in 2002. R/O echo with normal EF and no RWMAls will f/u with lexiscan myovue   Signed: Jenkins Rouge 05/14/2019,  2:19 PM

## 2019-05-14 ENCOUNTER — Encounter: Payer: Self-pay | Admitting: Cardiovascular Disease

## 2019-05-14 ENCOUNTER — Ambulatory Visit (INDEPENDENT_AMBULATORY_CARE_PROVIDER_SITE_OTHER): Payer: Medicare HMO

## 2019-05-14 ENCOUNTER — Other Ambulatory Visit (HOSPITAL_COMMUNITY)
Admission: RE | Admit: 2019-05-14 | Discharge: 2019-05-14 | Disposition: A | Discharge status: Home / Self Care | Payer: Medicare HMO | Source: Ambulatory Visit | Attending: Cardiovascular Disease | Admitting: Cardiovascular Disease

## 2019-05-14 ENCOUNTER — Telehealth: Payer: Self-pay

## 2019-05-14 ENCOUNTER — Other Ambulatory Visit: Payer: Self-pay

## 2019-05-14 ENCOUNTER — Ambulatory Visit: Payer: Medicare HMO | Admitting: Cardiovascular Disease

## 2019-05-14 VITALS — BP 121/64 | HR 75 | Temp 97.1°F | Ht 70.0 in | Wt 202.0 lb

## 2019-05-14 DIAGNOSIS — R600 Localized edema: Secondary | ICD-10-CM

## 2019-05-14 DIAGNOSIS — R079 Chest pain, unspecified: Secondary | ICD-10-CM | POA: Diagnosis not present

## 2019-05-14 DIAGNOSIS — R002 Palpitations: Secondary | ICD-10-CM

## 2019-05-14 DIAGNOSIS — R7989 Other specified abnormal findings of blood chemistry: Secondary | ICD-10-CM

## 2019-05-14 LAB — BRAIN NATRIURETIC PEPTIDE: B Natriuretic Peptide: 536 pg/mL — ABNORMAL HIGH (ref 0.0–100.0)

## 2019-05-14 LAB — BASIC METABOLIC PANEL
Anion gap: 12 (ref 5–15)
BUN: 24 mg/dL — ABNORMAL HIGH (ref 8–23)
CO2: 29 mmol/L (ref 22–32)
Calcium: 8.1 mg/dL — ABNORMAL LOW (ref 8.9–10.3)
Chloride: 96 mmol/L — ABNORMAL LOW (ref 98–111)
Creatinine, Ser: 1.36 mg/dL — ABNORMAL HIGH (ref 0.61–1.24)
GFR calc Af Amer: 53 mL/min — ABNORMAL LOW (ref >60)
GFR calc non Af Amer: 45 mL/min — ABNORMAL LOW (ref >60)
Glucose, Bld: 109 mg/dL — ABNORMAL HIGH (ref 70–99)
Potassium: 3.5 mmol/L (ref 3.5–5.1)
Sodium: 137 mmol/L (ref 135–145)

## 2019-05-14 MED ORDER — SPIRONOLACTONE 25 MG PO TABS: 12.5000 mg | ORAL_TABLET | Freq: Every day | ORAL | 3 refills | Status: DC

## 2019-05-14 NOTE — Telephone Encounter (Signed)
-----   Message from Josue Hector, MD sent at 05/14/2019  3:58 PM EDT ----- BNP mildly elevated CR/K stable still with LE edema start aldactone 12.5 daily take in between doses of torsemide f/u bmet and BNP in 3 weeks

## 2019-05-14 NOTE — Patient Instructions (Signed)
Medication Instructions:  Your physician recommends that you continue on your current medications as directed. Please refer to the Current Medication list given to you today.   Labwork: bnp bmet   Testing/Procedures: Your physician has recommended that you wear a holter monitor. Holter monitors are medical devices that record the heart's electrical activity. Doctors most often use these monitors to diagnose arrhythmias. Arrhythmias are problems with the speed or rhythm of the heartbeat. The monitor is a small, portable device. You can wear one while you do your normal daily activities. This is usually used to diagnose what is causing palpitations/syncope (passing out).  Your physician has requested that you have a lexiscan myoview. For further information please visit [email or web address]. Please follow instruction sheet, as given.    Follow-Up: Your physician recommends that you schedule a follow-up appointment in:  To be determined based on tests    Any Other Special Instructions Will Be Listed Below (If Applicable).     If you need a refill on your cardiac medications before your next appointment, please call your pharmacy.

## 2019-05-14 NOTE — Telephone Encounter (Signed)
Patient aware of results. Per Dr. Johnsie Cancel, BNP mildly elevated CR/K stable still with LE edema start aldactone 12.5 daily take in between doses of torsemide f/u bmet and BNP in 3 weeks. Patient verbalized understanding. Patient would like lab work done at [facility], placed orders for BNP and BMET for 06/04/19. Sent aldactone  12.5 mg to patient's pharmacy of choice.

## 2019-05-15 NOTE — Telephone Encounter (Signed)
Labs completed on yesterday

## 2019-05-15 NOTE — Telephone Encounter (Signed)
Called pt NA, LMTCB

## 2019-05-16 ENCOUNTER — Encounter (HOSPITAL_COMMUNITY): Payer: Medicare HMO

## 2019-05-21 ENCOUNTER — Telehealth: Payer: Self-pay | Admitting: Cardiovascular Disease

## 2019-05-21 NOTE — Telephone Encounter (Signed)
Should be able to continue eliquis unless frank bleeding f/u with his primary

## 2019-05-21 NOTE — Telephone Encounter (Signed)
Spoke with pt who states he has had rectal bleeding from 12 midnight til 5 am when he wipes. Pt has history of hemorrhoids. Pt denies ABD pain at this time. Encouraged pt to be seen in ER but he prefers to wait to hear from MD first. Please advice.

## 2019-05-21 NOTE — Telephone Encounter (Signed)
Pt left message stating he had bleeding from his rectum last night. He is on Eliquis and would like to know if he should continue to take it.   [phone]

## 2019-05-21 NOTE — Telephone Encounter (Signed)
Pt notified and voiced understanding 

## 2019-05-27 ENCOUNTER — Telehealth: Payer: Self-pay

## 2019-05-27 ENCOUNTER — Other Ambulatory Visit: Payer: Self-pay

## 2019-05-27 DIAGNOSIS — R002 Palpitations: Secondary | ICD-10-CM

## 2019-05-27 NOTE — Telephone Encounter (Signed)
-----   Message from Josue Hector, MD sent at 05/27/2019  1:28 PM EDT ----- Chronic afib continue eliquis and cardizem still pending myovue

## 2019-05-27 NOTE — Telephone Encounter (Signed)
Pt made aware, voiced understanding. Copy to pcp.  

## 2019-06-03 ENCOUNTER — Other Ambulatory Visit: Payer: Self-pay

## 2019-06-03 ENCOUNTER — Encounter (HOSPITAL_COMMUNITY): Admission: RE | Admit: 2019-06-03 | Payer: Medicare HMO | Source: Ambulatory Visit

## 2019-06-03 ENCOUNTER — Encounter (HOSPITAL_COMMUNITY)
Admission: RE | Admit: 2019-06-03 | Discharge: 2019-06-03 | Disposition: A | Discharge status: Home / Self Care | Payer: Medicare HMO | Source: Ambulatory Visit | Attending: Cardiovascular Disease | Admitting: Cardiovascular Disease

## 2019-06-03 DIAGNOSIS — R079 Chest pain, unspecified: Secondary | ICD-10-CM | POA: Insufficient documentation

## 2019-06-12 ENCOUNTER — Other Ambulatory Visit (HOSPITAL_COMMUNITY)
Admission: RE | Admit: 2019-06-12 | Discharge: 2019-06-12 | Disposition: A | Discharge status: Home / Self Care | Payer: Medicare HMO | Source: Ambulatory Visit | Attending: Cardiovascular Disease | Admitting: Cardiovascular Disease

## 2019-06-12 ENCOUNTER — Ambulatory Visit (HOSPITAL_COMMUNITY)
Admission: RE | Admit: 2019-06-12 | Discharge: 2019-06-12 | Disposition: A | Discharge status: Home / Self Care | Payer: Medicare HMO | Source: Ambulatory Visit | Attending: Cardiovascular Disease | Admitting: Cardiovascular Disease

## 2019-06-12 ENCOUNTER — Other Ambulatory Visit: Payer: Self-pay

## 2019-06-12 ENCOUNTER — Encounter (HOSPITAL_COMMUNITY): Payer: Self-pay

## 2019-06-12 ENCOUNTER — Encounter (HOSPITAL_BASED_OUTPATIENT_CLINIC_OR_DEPARTMENT_OTHER)
Admission: RE | Admit: 2019-06-12 | Discharge: 2019-06-12 | Disposition: A | Discharge status: Home / Self Care | Payer: Medicare HMO | Source: Ambulatory Visit | Attending: Cardiovascular Disease | Admitting: Cardiovascular Disease

## 2019-06-12 DIAGNOSIS — R079 Chest pain, unspecified: Secondary | ICD-10-CM | POA: Insufficient documentation

## 2019-06-12 DIAGNOSIS — R002 Palpitations: Secondary | ICD-10-CM | POA: Insufficient documentation

## 2019-06-12 DIAGNOSIS — R0602 Shortness of breath: Secondary | ICD-10-CM | POA: Insufficient documentation

## 2019-06-12 HISTORY — DX: Malignant (primary) neoplasm, unspecified: C80.1

## 2019-06-12 LAB — BASIC METABOLIC PANEL
Anion gap: 13 (ref 5–15)
BUN: 24 mg/dL — ABNORMAL HIGH (ref 8–23)
CO2: 26 mmol/L (ref 22–32)
Calcium: 8.6 mg/dL — ABNORMAL LOW (ref 8.9–10.3)
Chloride: 98 mmol/L (ref 98–111)
Creatinine, Ser: 1.5 mg/dL — ABNORMAL HIGH (ref 0.61–1.24)
GFR calc Af Amer: 47 mL/min — ABNORMAL LOW (ref >60)
GFR calc non Af Amer: 40 mL/min — ABNORMAL LOW (ref >60)
Glucose, Bld: 141 mg/dL — ABNORMAL HIGH (ref 70–99)
Potassium: 4.3 mmol/L (ref 3.5–5.1)
Sodium: 137 mmol/L (ref 135–145)

## 2019-06-12 LAB — NM MYOCAR MULTI W/SPECT W/WALL MOTION / EF
LV dias vol: 99 mL (ref 62–150)
LV sys vol: 33 mL
Peak HR: 90 {beats}/min
RATE: 0.46
Rest HR: 73 {beats}/min
SDS: 1
SRS: 0
SSS: 1
TID: 0.92

## 2019-06-12 LAB — BRAIN NATRIURETIC PEPTIDE: B Natriuretic Peptide: 440 pg/mL — ABNORMAL HIGH (ref 0.0–100.0)

## 2019-06-12 MED ORDER — REGADENOSON 0.4 MG/5ML IV SOLN
INTRAVENOUS | Status: AC
  Administered 2019-06-12: 5 mL via INTRAVENOUS
  Filled 2019-06-12: qty 5

## 2019-06-12 MED ORDER — SODIUM CHLORIDE FLUSH 0.9 % IV SOLN
INTRAVENOUS | Status: AC
  Administered 2019-06-12: 10 mL via INTRAVENOUS
  Filled 2019-06-12: qty 10

## 2019-06-12 MED ORDER — TECHNETIUM TC 99M TETROFOSMIN IV KIT
30.0000 | PACK | Freq: Once | INTRAVENOUS | Status: AC | PRN
  Administered 2019-06-12: 31.6 via INTRAVENOUS

## 2019-06-12 MED ORDER — TECHNETIUM TC 99M TETROFOSMIN IV KIT
10.0000 | PACK | Freq: Once | INTRAVENOUS | Status: AC | PRN
  Administered 2019-06-12: 11 via INTRAVENOUS

## 2019-09-16 ENCOUNTER — Ambulatory Visit: Payer: Medicare HMO

## 2019-09-16 ENCOUNTER — Ambulatory Visit: Payer: Medicare HMO | Attending: Internal Medicine

## 2019-09-16 DIAGNOSIS — Z23 Encounter for immunization: Secondary | ICD-10-CM | POA: Insufficient documentation

## 2019-09-16 NOTE — Progress Notes (Signed)
   Covid-19 Vaccination Clinic  Name:  David Acosta    MRN: [id number] DOB: 12/29/1925  09/16/2019  [name] was observed post Covid-19 immunization for 15 minutes without incidence. He was provided with Vaccine Information Sheet and instruction to access the V-Safe system.   [name] was instructed to call 911 with any severe reactions post vaccine: Marland Kitchen Difficulty breathing  . Swelling of your face and throat  . A fast heartbeat  . A bad rash all over your body  . Dizziness and weakness    Immunizations Administered    Name Date Dose VIS Date Route   Pfizer COVID-19 Vaccine 09/16/2019  4:45 PM 0.3 mL 07/19/2019 Intramuscular   Manufacturer: [place]   Lot: [id number]   [employer]: [id number]

## 2019-10-11 ENCOUNTER — Ambulatory Visit: Payer: Medicare HMO | Attending: Internal Medicine

## 2019-10-11 DIAGNOSIS — Z23 Encounter for immunization: Secondary | ICD-10-CM

## 2019-10-11 NOTE — Progress Notes (Signed)
   Covid-19 Vaccination Clinic  Name:  David Acosta    MRN: [id number] DOB: 1926/07/27  10/11/2019  [name] was observed post Covid-19 immunization for 15 minutes without incident. He was provided with Vaccine Information Sheet and instruction to access the V-Safe system.   [name] was instructed to call 911 with any severe reactions post vaccine: Marland Kitchen Difficulty breathing  . Swelling of face and throat  . A fast heartbeat  . A bad rash all over body  . Dizziness and weakness   Immunizations Administered    Name Date Dose VIS Date Route   Pfizer COVID-19 Vaccine 10/11/2019  1:43 PM 0.3 mL 07/19/2019 Intramuscular   Manufacturer: [place]   Lot: [id number]   [employer]: [id number]

## 2020-01-13 NOTE — Progress Notes (Signed)
CARDIOLOGY CONSULT NOTE       Patient ID: David Acosta MRN: [id number] DOB/AGE: 10-18-1925 86 y.o.  Admit date: (Not on file) Referring Physician: Redmond Pulling Primary Physician: Christain Sacramento, MD Primary Cardiologist: New Reason for Consultation: Afib/CHF  Active Problems:   * No active hospital problems. *   HPI:  86 y.o. f/u  for afib and CHF. History of  chronic PVCls since 1970's Normal cath in 2002. Normal ETT 2014 Holter only 3% PVC;s EF 60-65% by echo Did better on verapamil thought lopressor made him worse Admitted August 2020  with chest pain precordial radiating to back. He had LE edema CT no dissection Thought to have diastolic CHF Rx with lasix. Echo with no significant valve disease and noted to have "new" afib on telemetry LA diameter on echo was 3.8 cm CXR with vascular congestion BNP only 147.  K 3.2 Cr 0.99 Hct 38.6 PLT 206 R/O  ECG to my review 03/25/19 showed SR with PAC/PVC Despite this he was started on eliquis for CHADVASC 4 D/c with eliquis 5 bid, lasix 40 mg daily KCL 20 mEq and cardizem 240 mg daily Monitor 05/14/19 showed 100% chronic afib with good rate control   Myovue 06/12/19 normal no ischemia EF 67%   Has had some rectal bleeding from hemorrhoids  Slowing down a bit. Walks with cane but not as far as he use to. Has 4 oz of vodka daily in afternoon Hemorids act up on occasion Been married 68 years Wife's health a little worse, Has tinnitus at night   ROS All other systems reviewed and negative except as noted above  Past Medical History:  Diagnosis Date  . Cancer (HCC)    Skin  . Dysphagia 10/05/2017  . Glaucoma   . Hypertension   . Macular degeneration   . Premature ventricular contraction 2013    Family History  Problem Relation Age of Onset  . Leukemia Mother   . Heart attack Father   . Heart failure Sister   . Heart attack Brother   . Uterine cancer Paternal Grandfather     Social History   Socioeconomic History  . Marital status:  Married    Spouse name: Not on file  . Number of children: Not on file  . Years of education: Not on file  . Highest education level: Not on file  Occupational History  . Not on file  Tobacco Use  . Smoking status: Former Smoker    Quit date: 01/23/1959    Years since quitting: 61.0  . Smokeless tobacco: Never Used  Vaping Use  . Vaping Use: Never used  Substance and Sexual Activity  . Alcohol use: Yes    Comment: daily, 2.7 oz   . Drug use: No  . Sexual activity: Not on file  Other Topics Concern  . Not on file  Social History Narrative  . Not on file   Social Determinants of Health   Financial Resource Strain:   . Difficulty of Paying Living Expenses:   Food Insecurity:   . Worried About Charity fundraiser in the Last Year:   . Arboriculturist in the Last Year:   Transportation Needs:   . Film/video editor (Medical):   Marland Kitchen Lack of Transportation (Non-Medical):   Physical Activity:   . Days of Exercise per Week:   . Minutes of Exercise per Session:   Stress:   . Feeling of Stress :   Social Connections:   .  Frequency of Communication with Friends and Family:   . Frequency of Social Gatherings with Friends and Family:   . Attends Religious Services:   . Active Member of Clubs or Organizations:   . Attends Archivist Meetings:   Marland Kitchen Marital Status:   Intimate Partner Violence:   . Fear of Current or Ex-Partner:   . Emotionally Abused:   Marland Kitchen Physically Abused:   . Sexually Abused:     Past Surgical History:  Procedure Laterality Date  . CATARACT EXTRACTION W/PHACO Left 01/28/2014   Procedure: CATARACT EXTRACTION PHACO AND INTRAOCULAR LENS PLACEMENT (IOC);  Surgeon: Elta Guadeloupe T. Gershon Crane, MD;  Location: [facility];  Service: Ophthalmology;  Laterality: Left;  CDE 16.74  . CATARACT EXTRACTION W/PHACO Right 02/18/2014   Procedure: CATARACT EXTRACTION PHACO AND INTRAOCULAR LENS PLACEMENT (IOC);  Surgeon: Elta Guadeloupe T. Gershon Crane, MD;  Location: [facility];  Service: Ophthalmology;   Laterality: Right;  CDE 9.15  . cholesteoma mastoidectomy Right 2012  . Dupytrens contraction Right 2010  . ESOPHAGEAL DILATION N/A 11/23/2017   Procedure: ESOPHAGEAL DILATION;  Surgeon: Rogene Houston, MD;  [facility];  Service: Endoscopy;  Laterality: N/A;  . ESOPHAGOGASTRODUODENOSCOPY N/A 11/23/2017   Procedure: ESOPHAGOGASTRODUODENOSCOPY (EGD);  Surgeon: Rogene Houston, MD;  [facility];  Service: Endoscopy;  Laterality: N/A;  12:45  . NASAL SEPTUM SURGERY    . TONSILLECTOMY        Physical Exam:  There were no vitals taken for this visit. Affect appropriate Elderly white male  HEENT: normal Neck supple with no adenopathy JVP normal no bruits no thyromegaly Lungs clear with no wheezing and good diaphragmatic motion Heart:  S1/S2 no murmur, no rub, gallop or click PMI normal Abdomen: benighn, BS positve, no tenderness, no AAA no bruit.  No HSM or HJR Distal pulses intact with no bruits Plus 2 bilateral  edema Neuro non-focal Skin warm and dry No muscular weakness   Labs:   Lab Results  Component Value Date   WBC 9.4 03/24/2019   HGB 12.4 (L) 03/24/2019   HCT 38.6 (L) 03/24/2019   MCV 100.3 (H) 03/24/2019   PLT 206 03/24/2019   No results for input(s): NA, K, CL, CO2, BUN, CREATININE, CALCIUM, PROT, BILITOT, ALKPHOS, ALT, AST, GLUCOSE in the last 168 hours.  Invalid input(s): LABALBU Lab Results  Component Value Date   TROPONINI <0.30 01/09/2013    Lab Results  Component Value Date   CHOL 135 03/24/2019   Lab Results  Component Value Date   HDL 52 03/24/2019   Lab Results  Component Value Date   LDLCALC 75 03/24/2019   Lab Results  Component Value Date   TRIG 41 03/24/2019   Lab Results  Component Value Date   CHOLHDL 2.6 03/24/2019   No results found for: LDLDIRECT    Radiology: No results found.  EKG: SR PaC/PVC no afib ECG 05/14/19 afib    ASSESSMENT AND PLAN:   1. AFib:  Chronic continue rate control and  anticoagulation strategy given age and normal EF will check BMET and Hb/Hct today make sure GFR ok for normal dose eliquis and not anemic with Hemorids   2. Diastolic CHF:  BNP only 254 on initial presentation  with normal EF by echo mild LVH and indeterminate diastolic evalvuation no significant valve disease Now on toresemide 40 bid Still with LE edema Not clear that some of this is from LE venous disease and appears more chronic with stasis changes  3. Chest Pain:  No history of CAD Normal cath in 2002. Echo with no RWMAls Myovue non ischemic 06/12/19 observe appears to be non cardiac   Signed: Jenkins Rouge 01/21/2020, 9:34 AM

## 2020-01-21 ENCOUNTER — Encounter: Payer: Self-pay | Admitting: Cardiovascular Disease

## 2020-01-21 ENCOUNTER — Telehealth: Payer: Self-pay

## 2020-01-21 ENCOUNTER — Ambulatory Visit: Payer: Medicare HMO | Admitting: Cardiovascular Disease

## 2020-01-21 ENCOUNTER — Telehealth: Payer: Self-pay | Admitting: *Deleted

## 2020-01-21 ENCOUNTER — Other Ambulatory Visit: Payer: Self-pay

## 2020-01-21 ENCOUNTER — Other Ambulatory Visit (HOSPITAL_COMMUNITY)
Admission: RE | Admit: 2020-01-21 | Discharge: 2020-01-21 | Disposition: A | Discharge status: Home / Self Care | Payer: Medicare HMO | Source: Ambulatory Visit | Attending: Cardiovascular Disease | Admitting: Cardiovascular Disease

## 2020-01-21 VITALS — BP 114/56 | HR 69 | Ht 70.0 in | Wt 179.8 lb

## 2020-01-21 DIAGNOSIS — I5031 Acute diastolic (congestive) heart failure: Secondary | ICD-10-CM | POA: Diagnosis present

## 2020-01-21 DIAGNOSIS — I482 Chronic atrial fibrillation, unspecified: Secondary | ICD-10-CM

## 2020-01-21 LAB — CBC
HCT: 39.2 % (ref 39.0–52.0)
Hemoglobin: 12.5 g/dL — ABNORMAL LOW (ref 13.0–17.0)
MCH: 32.6 pg (ref 26.0–34.0)
MCHC: 31.9 g/dL (ref 30.0–36.0)
MCV: 102.1 fL — ABNORMAL HIGH (ref 80.0–100.0)
Platelets: 245 10*3/uL (ref 150–400)
RBC: 3.84 MIL/uL — ABNORMAL LOW (ref 4.22–5.81)
RDW: 14.5 % (ref 11.5–15.5)
WBC: 7.4 10*3/uL (ref 4.0–10.5)
nRBC: 0 % (ref 0.0–0.2)

## 2020-01-21 LAB — BASIC METABOLIC PANEL
Anion gap: 10 (ref 5–15)
BUN: 29 mg/dL — ABNORMAL HIGH (ref 8–23)
CO2: 28 mmol/L (ref 22–32)
Calcium: 8.3 mg/dL — ABNORMAL LOW (ref 8.9–10.3)
Chloride: 97 mmol/L — ABNORMAL LOW (ref 98–111)
Creatinine, Ser: 1.57 mg/dL — ABNORMAL HIGH (ref 0.61–1.24)
GFR calc Af Amer: 44 mL/min — ABNORMAL LOW (ref >60)
GFR calc non Af Amer: 38 mL/min — ABNORMAL LOW (ref >60)
Glucose, Bld: 101 mg/dL — ABNORMAL HIGH (ref 70–99)
Potassium: 4.1 mmol/L (ref 3.5–5.1)
Sodium: 135 mmol/L (ref 135–145)

## 2020-01-21 MED ORDER — APIXABAN 2.5 MG PO TABS: 2.5000 mg | ORAL_TABLET | Freq: Two times a day (BID) | ORAL | 3 refills | Status: DC

## 2020-01-21 MED ORDER — SPIRONOLACTONE 25 MG PO TABS: 12.5000 mg | ORAL_TABLET | Freq: Every day | ORAL | 3 refills | Status: DC

## 2020-01-21 NOTE — Patient Instructions (Signed)
Medication Instructions:  Your physician recommends that you continue on your current medications as directed. Please refer to the Current Medication list given to you today.  *If you need a refill on your cardiac medications before your next appointment, please call your pharmacy*   Lab Work: NONE  If you have labs (blood work) drawn today and your tests are completely normal, you will receive your results only by: MyChart Message (if you have MyChart) OR A paper copy in the mail If you have any lab test that is abnormal or we need to change your treatment, we will call you to review the results.   Testing/Procedures: NONE    Follow-Up: At [facility], you and your health needs are our priority.  As part of our continuing mission to provide you with exceptional heart care, we have created designated Provider Care Teams.  These Care Teams include your primary Cardiologist (physician) and Advanced Practice Providers (APPs -  Physician Assistants and Nurse Practitioners) who all work together to provide you with the care you need, when you need it.  We recommend signing up for the patient portal called "MyChart".  Sign up information is provided on this After Visit Summary.  MyChart is used to connect with patients for Virtual Visits (Telemedicine).  Patients are able to view lab/test results, encounter notes, upcoming appointments, etc.  Non-urgent messages can be sent to your provider as well.   To learn more about what you can do with MyChart, go to [email or web address].    Your next appointment:   6 month(s)  The format for your next appointment:   In Person  Provider:   Peter [name], MD   Other Instructions Thank you for choosing [facility]!    

## 2020-01-21 NOTE — Telephone Encounter (Signed)
-----   Message from Josue Hector, MD sent at 01/21/2020 12:32 PM EDT ----- GFR 38 with age change eliquis to 2.5 mg bid CRF stable Anemia stable

## 2020-01-21 NOTE — Telephone Encounter (Signed)
Pt has questions QO:HCOBTV dosage changes    Please call [phone]    Thanks renee

## 2020-01-21 NOTE — Telephone Encounter (Signed)
Patient asked if he can cut 5 mg tablet in half.Advised patient not to cut eliquis in half.

## 2020-01-21 NOTE — Telephone Encounter (Signed)
Pt.notified

## 2020-01-27 ENCOUNTER — Other Ambulatory Visit: Payer: Self-pay | Admitting: Cardiovascular Disease

## 2020-01-27 NOTE — Telephone Encounter (Signed)
Refilled 01/21/20 pt aware

## 2020-01-27 NOTE — Telephone Encounter (Signed)
Pt wants to confirm sprionolactone has been called to [facility]   Please call [phone]   Thanks renee

## 2020-04-08 ENCOUNTER — Telehealth: Payer: Self-pay | Admitting: Cardiovascular Disease

## 2020-04-08 MED ORDER — APIXABAN 2.5 MG PO TABS: 2.5000 mg | ORAL_TABLET | Freq: Two times a day (BID) | ORAL | 0 refills | Status: DC

## 2020-04-08 NOTE — Telephone Encounter (Signed)
Pt has questions about eliquis   Please call [phone]

## 2020-04-08 NOTE — Telephone Encounter (Signed)
Application and samples of Eliquis 2.5 mg placed at front desk for pt pick up.

## 2020-04-08 NOTE — Telephone Encounter (Signed)
Patient contacted office to see if he needed to stay on eliquis due to being in donut hole and costing him $420 each month till the end of the year. Advised that he would need to continue eliquis until told otherwise by his provider to treat a-fib. Reports having 18 days worth of eliquis on hand. Advised that 2 weeks worth eliquis samples will be available for pick up at the [facility] office along with BMS PAF application for elquis. Aware to fill out all patient sections and bring back to office with total household income proof and 2021 out of pocket prescription expense. Verbalized understanding of plan.

## 2020-04-26 ENCOUNTER — Other Ambulatory Visit: Payer: Self-pay | Admitting: Cardiovascular Disease

## 2020-04-27 NOTE — Telephone Encounter (Signed)
Called and spoke to patient and he stated he gets his spironolactone from [facility] not [facility] and he has already gotten it and does not need refill at this time. He thanked me for my call.

## 2020-05-13 ENCOUNTER — Telehealth: Payer: Self-pay | Admitting: Cardiovascular Disease

## 2020-05-13 NOTE — Telephone Encounter (Signed)
Pt notified that 2 sample boxes of Eliquis 2.5 mg have been placed at front desk.

## 2020-05-13 NOTE — Telephone Encounter (Signed)
New message     Patient received approval for assistance with Eliquis- they are suppose to be shipping it to the office , he only has 2 days left  Patient calling the office for samples of medication:   1.  What medication and dosage are you requesting samples for? apixaban (ELIQUIS) 2.5 MG TABS tablet  2.  Are you currently out of this medication? 2 days left

## 2020-05-19 ENCOUNTER — Telehealth: Payer: Self-pay | Admitting: *Deleted

## 2020-05-19 NOTE — Telephone Encounter (Signed)
Pt notified that patient assistance Eliquis has arrived in office. Pt states he will pick up on tomorrow.

## 2020-05-29 ENCOUNTER — Other Ambulatory Visit: Payer: Self-pay | Admitting: Cardiovascular Disease

## 2020-05-29 NOTE — Telephone Encounter (Signed)
This is a [place] pt.  °

## 2020-07-09 ENCOUNTER — Ambulatory Visit: Payer: Medicare HMO | Attending: Internal Medicine

## 2020-07-09 DIAGNOSIS — Z23 Encounter for immunization: Secondary | ICD-10-CM

## 2020-07-09 NOTE — Progress Notes (Signed)
   Covid-19 Vaccination Clinic  Name:  David Acosta    MRN: [id number] DOB: 1926/04/13  07/09/2020  [name] was observed post Covid-19 immunization for 15 minutes without incident. He was provided with Vaccine Information Sheet and instruction to access the V-Safe system.   [name] was instructed to call 911 with any severe reactions post vaccine: Marland Kitchen Difficulty breathing  . Swelling of face and throat  . A fast heartbeat  . A bad rash all over body  . Dizziness and weakness   Immunizations Administered    Name Date Dose VIS Date Route   Pfizer COVID-19 Vaccine 07/09/2020  3:00 PM 0.3 mL 05/27/2020 Intramuscular   Manufacturer: [place]   Lot: [id number]   NDC: [address]-8

## 2020-07-20 NOTE — Progress Notes (Signed)
CARDIOLOGY CONSULT NOTE       Patient ID: CRISTINA MATTERN MRN: [id number] DOB/AGE: [id number] 86 y.o.  Admit date: (Not on file) Referring Physician: Redmond Pulling Primary Physician: Christain Sacramento, MD Primary Cardiologist: New Reason for Consultation: Afib/CHF  Active Problems:   * No active hospital problems. *   HPI:  86 y.o. f/u  for afib and CHF. History of  chronic PVCls since 1970's Normal cath in 2002. Normal ETT 2014 Holter only 3% PVC;s EF 60-65% by echo Did better on verapamil thought lopressor made him worse Admitted August 2020  with chest pain precordial radiating to back. He had LE edema CT no dissection Thought to have diastolic CHF Rx with lasix. Echo with no significant valve disease and noted to have "new" afib on telemetry LA diameter on echo was 3.8 cm CXR with vascular congestion BNP only 147.     CHADVASC 4 Monitor 05/14/19 showed 100% chronic afib with good rate control   Myovue 06/12/19 normal no ischemia EF 67%   Has had some rectal bleeding from hemorrhoids  Walks with cane but not as far as he use to. Has 4 oz of vodka daily in afternoon Hemorids act up on occasion Been married 69 years    Has exertional dyspnea and fatigue Not daily but frequently    ROS All other systems reviewed and negative except as noted above  Past Medical History:  Diagnosis Date  . Cancer (HCC)    Skin  . Dysphagia 10/05/2017  . Glaucoma   . Hypertension   . Macular degeneration   . Premature ventricular contraction 2013    Family History  Problem Relation Age of Onset  . Leukemia Mother   . Heart attack Father   . Heart failure Sister   . Heart attack Brother   . Uterine cancer Paternal Grandfather     Social History   Socioeconomic History  . Marital status: Married    Spouse name: Not on file  . Number of children: Not on file  . Years of education: Not on file  . Highest education level: Not on file  Occupational History  . Not on file  Tobacco Use  .  Smoking status: Former Smoker    Quit date: 01/23/1959    Years since quitting: 61.5  . Smokeless tobacco: Never Used  Vaping Use  . Vaping Use: Never used  Substance and Sexual Activity  . Alcohol use: Yes    Comment: daily, 2.7 oz   . Drug use: No  . Sexual activity: Not on file  Other Topics Concern  . Not on file  Social History Narrative  . Not on file   Social Determinants of Health   Financial Resource Strain: Not on file  Food Insecurity: Not on file  Transportation Needs: Not on file  Physical Activity: Not on file  Stress: Not on file  Social Connections: Not on file  Intimate Partner Violence: Not on file    Past Surgical History:  Procedure Laterality Date  . CATARACT EXTRACTION W/PHACO Left 01/28/2014   Procedure: CATARACT EXTRACTION PHACO AND INTRAOCULAR LENS PLACEMENT (IOC);  Surgeon: Elta Guadeloupe T. Gershon Crane, MD;  Location: [facility] ORS;  Service: Ophthalmology;  Laterality: Left;  CDE 16.74  . CATARACT EXTRACTION W/PHACO Right 02/18/2014   Procedure: CATARACT EXTRACTION PHACO AND INTRAOCULAR LENS PLACEMENT (IOC);  Surgeon: Elta Guadeloupe T. Gershon Crane, MD;  Location: [facility] ORS;  Service: Ophthalmology;  Laterality: Right;  CDE 9.15  . cholesteoma mastoidectomy Right 2012  .  Dupytrens contraction Right 2010  . ESOPHAGEAL DILATION N/A 11/23/2017   Procedure: ESOPHAGEAL DILATION;  Surgeon: Rogene Houston, MD;  Location: [facility] END[facility] SUITE;  Service: Endoscopy;  Laterality: N/A;  . ESOPHAGOGASTRODUODENOSCOPY N/A 11/23/2017   Procedure: ESOPHAGOGASTRODUODENOSCOPY (EGD);  Surgeon: Rogene Houston, MD;  Location: [facility] SU[facility];  Service: Endoscopy;  Laterality: N/A;  12:45  . NASAL SEPTUM SURGERY    . TONSILLECTOMY        Physical Exam:  BP 132/76   Pulse 66   Ht 5\' 10"  (1.778 m)   Wt 77.6 kg   SpO2 94%   BMI 24.54 kg/m  Affect appropriate Healthy:  appears stated age 7: normal Neck supple with no adenopathy JVP normal no bruits no thyromegaly Lungs clear with no wheezing and good  diaphragmatic motion Heart:  S1/S2 no murmur, no rub, gallop or click PMI normal Abdomen: benighn, BS positve, no tenderness, no AAA no bruit.  No HSM or HJR Distal pulses intact with no bruits Bilateral edema Neuro non-focal Skin warm and dry No muscular weakness    Labs:   Lab Results  Component Value Date   WBC 7.4 01/21/2020   HGB 12.5 (L) 01/21/2020   HCT 39.2 01/21/2020   MCV 102.1 (H) 01/21/2020   PLT 245 01/21/2020   No results for input(s): NA, K, CL, CO2, BUN, CREATININE, CALCIUM, PROT, BILITOT, ALKPHOS, ALT, AST, GLUCOSE in the last 168 hours.  Invalid input(s): LABALBU Lab Results  Component Value Date   TROPONINI <0.30 01/09/2013    Lab Results  Component Value Date   CHOL 135 03/24/2019   Lab Results  Component Value Date   HDL 52 03/24/2019   Lab Results  Component Value Date   LDLCALC 75 03/24/2019   Lab Results  Component Value Date   TRIG 41 03/24/2019   Lab Results  Component Value Date   CHOLHDL 2.6 03/24/2019   No results found for: LDLDIRECT    Radiology: No results found.  EKG: SR PaC/PVC no afib ECG 05/14/19 afib 07/27/2020 afib RBBB rate 66    ASSESSMENT AND PLAN:   1. AFib:  Chronic continue rate control and anticoagulation strategy given age and normal EF He has gotten eliquis patient assistance No bleeding issues    2. Diastolic CHF:  Normal EF by echo mild LVH and indeterminate diastolic evalvuation no significant valve disease Now on toresemide 40 bid Still with LE edema Not clear that some of this is from LE venous disease and appears more chronic with stasis changes  With dyspnea will recheck echo for EF   3. Chest Pain:  No history of CAD Normal cath in 2002. Echo with no RWMAls Myovue non ischemic 06/12/19 observe appears to be non cardiac   F/U in  6 ,pmtjs  Echo for dyspnea   Signed: Jenkins Rouge 07/27/2020, 9:31 AM

## 2020-07-27 ENCOUNTER — Other Ambulatory Visit: Payer: Self-pay

## 2020-07-27 ENCOUNTER — Encounter: Payer: Self-pay | Admitting: Cardiovascular Disease

## 2020-07-27 ENCOUNTER — Ambulatory Visit: Payer: Medicare HMO | Admitting: Cardiovascular Disease

## 2020-07-27 VITALS — BP 132/76 | HR 66 | Ht 70.0 in | Wt 171.0 lb

## 2020-07-27 DIAGNOSIS — I4891 Unspecified atrial fibrillation: Secondary | ICD-10-CM

## 2020-07-27 DIAGNOSIS — R0600 Dyspnea, unspecified: Secondary | ICD-10-CM

## 2020-07-27 NOTE — Patient Instructions (Signed)
Medication Instructions:  Your physician recommends that you continue on your current medications as directed. Please refer to the Current Medication list given to you today.  *If you need a refill on your cardiac medications before your next appointment, please call your pharmacy*   Lab Work: NONE  If you have labs (blood work) drawn today and your tests are completely normal, you will receive your results only by: MyChart Message (if you have MyChart) OR A paper copy in the mail If you have any lab test that is abnormal or we need to change your treatment, we will call you to review the results.   Testing/Procedures: Your physician has requested that you have an echocardiogram. Echocardiography is a painless test that uses sound waves to create images of your heart. It provides your doctor with information about the size and shape of your heart and how well your heart's chambers and valves are working. This procedure takes approximately one hour. There are no restrictions for this procedure.   Follow-Up: At [facility], you and your health needs are our priority.  As part of our continuing mission to provide you with exceptional heart care, we have created designated Provider Care Teams.  These Care Teams include your primary Cardiologist (physician) and Advanced Practice Providers (APPs -  Physician Assistants and Nurse Practitioners) who all work together to provide you with the care you need, when you need it.  We recommend signing up for the patient portal called "MyChart".  Sign up information is provided on this After Visit Summary.  MyChart is used to connect with patients for Virtual Visits (Telemedicine).  Patients are able to view lab/test results, encounter notes, upcoming appointments, etc.  Non-urgent messages can be sent to your provider as well.   To learn more about what you can do with MyChart, go to [email or web address].    Your next appointment:   6  month(s)  The format for your next appointment:   In Person  Provider:   Peter [name], MD   Other Instructions Thank you for choosing [facility]! '  

## 2020-08-03 ENCOUNTER — Other Ambulatory Visit (HOSPITAL_COMMUNITY): Payer: Medicare HMO

## 2020-08-04 ENCOUNTER — Ambulatory Visit (HOSPITAL_COMMUNITY)
Admission: RE | Admit: 2020-08-04 | Discharge: 2020-08-04 | Disposition: A | Discharge status: Home / Self Care | Payer: Medicare HMO | Source: Ambulatory Visit | Attending: Cardiovascular Disease | Admitting: Cardiovascular Disease

## 2020-08-04 ENCOUNTER — Other Ambulatory Visit: Payer: Self-pay

## 2020-08-04 DIAGNOSIS — R0600 Dyspnea, unspecified: Secondary | ICD-10-CM | POA: Diagnosis not present

## 2020-08-04 LAB — ECHOCARDIOGRAM COMPLETE
Area-P 1/2: 4.53 cm2
MV M vel: 4.85 m/s
MV Peak grad: 94.1 mmHg
Radius: 0.6 cm
S' Lateral: 4.1 cm

## 2020-08-04 NOTE — Progress Notes (Signed)
*  PRELIMINARY RESULTS* Echocardiogram 2D Echocardiogram has been performed.  Stacey Drain 08/04/2020, 11:15 AM

## 2020-08-05 ENCOUNTER — Telehealth: Payer: Self-pay

## 2020-08-05 DIAGNOSIS — R9430 Abnormal result of cardiovascular function study, unspecified: Secondary | ICD-10-CM

## 2020-08-05 DIAGNOSIS — I4891 Unspecified atrial fibrillation: Secondary | ICD-10-CM

## 2020-08-05 DIAGNOSIS — R0600 Dyspnea, unspecified: Secondary | ICD-10-CM

## 2020-08-05 DIAGNOSIS — R002 Palpitations: Secondary | ICD-10-CM

## 2020-08-05 DIAGNOSIS — R079 Chest pain, unspecified: Secondary | ICD-10-CM

## 2020-08-05 DIAGNOSIS — R931 Abnormal findings on diagnostic imaging of heart and coronary circulation: Secondary | ICD-10-CM

## 2020-08-05 MED ORDER — ENTRESTO 24-26 MG PO TABS: 1.0000 | ORAL_TABLET | Freq: Two times a day (BID) | ORAL | 11 refills | Status: DC

## 2020-08-05 NOTE — Telephone Encounter (Signed)
-----   Message from Wendall Stade, MD sent at 08/04/2020  5:30 PM EST ----- EF 45% not clear if its just afib. Needs f/u lexiscan myovue r/o CAD had one over a year ago that was ok Start low dose entresto f/u BMET 3 weeks as baseline CR around 1.5

## 2020-08-05 NOTE — Telephone Encounter (Signed)
The patient has been notified of the result and verbalized understanding.  All questions (if any) were answered. Ethelda Chick, RN 08/05/2020 9:04 AM    Patient would like lab work and Pension scheme manager done at [facility] [employer]. Patient will start Entresto today. Will send message to [facility] triage to help schedule lexiscan. Orders have already been placed for lab work, Fish farm manager. Went over instructions for lexiscan with patient and sent instructions through Mychart.

## 2020-08-06 ENCOUNTER — Other Ambulatory Visit: Payer: Self-pay

## 2020-08-06 DIAGNOSIS — R079 Chest pain, unspecified: Secondary | ICD-10-CM

## 2020-08-10 ENCOUNTER — Telehealth: Payer: Self-pay | Admitting: Cardiovascular Disease

## 2020-08-10 DIAGNOSIS — Z79899 Other long term (current) drug therapy: Secondary | ICD-10-CM

## 2020-08-10 MED ORDER — LISINOPRIL 5 MG PO TABS: 5.0000 mg | ORAL_TABLET | Freq: Every day | ORAL | 3 refills | Status: DC

## 2020-08-10 NOTE — Telephone Encounter (Signed)
Spoke with pt who states that he is intolerant to [place]. Pt states that he took Entresto for 3 days and became fatigued and SOB. He reports that his BP was 128/68, 87/54, 100/58, 80/51.  while on Entresto. Pt stoped taking on 08/08/20. Current BP is 102/57 HR 72.  Please advise.

## 2020-08-10 NOTE — Telephone Encounter (Signed)
Pt notified and orders placed  

## 2020-08-10 NOTE — Telephone Encounter (Signed)
Can try lisinopril 5 mg at night bmet in 2 weeks if starts

## 2020-08-10 NOTE — Telephone Encounter (Signed)
New message    Is patient suppose to be taking both the diltiazem and sacubitril-valsartan (ENTRESTO) 24-26 MG this dropped his bp way to low , and he had to pay over $100 for this medication

## 2020-08-11 ENCOUNTER — Encounter (HOSPITAL_COMMUNITY): Payer: Medicare HMO

## 2020-08-11 ENCOUNTER — Ambulatory Visit (HOSPITAL_COMMUNITY): Payer: Medicare HMO

## 2020-08-17 NOTE — Telephone Encounter (Signed)
Attempt to reach, line says call cannot be completed as dialed.Hang up and try your call again later.

## 2020-08-17 NOTE — Telephone Encounter (Signed)
Please give pt a call concerning the Lisinopril

## 2020-08-19 NOTE — Telephone Encounter (Signed)
Pt states that he is being seen for his hemorrhoids and does not want to start Lisinopril until this is taken care of. Pt informed that he needs to go ahead and start Lisinopril at this time and have lab work done in 2 weeks.

## 2020-08-26 ENCOUNTER — Other Ambulatory Visit: Payer: Medicare HMO

## 2020-09-16 ENCOUNTER — Emergency Department (HOSPITAL_COMMUNITY): Payer: Medicare HMO

## 2020-09-16 ENCOUNTER — Other Ambulatory Visit: Payer: Self-pay

## 2020-09-16 ENCOUNTER — Observation Stay (HOSPITAL_COMMUNITY)
Admission: EM | Admit: 2020-09-16 | Discharge: 2020-09-19 | Disposition: A | Discharge status: Home / Self Care | Payer: Medicare HMO | Attending: Internal Medicine | Admitting: Internal Medicine

## 2020-09-16 ENCOUNTER — Ambulatory Visit
Admission: EM | Admit: 2020-09-16 | Discharge: 2020-09-16 | Disposition: A | Discharge status: Home / Self Care | Payer: Medicare HMO

## 2020-09-16 ENCOUNTER — Encounter (HOSPITAL_COMMUNITY): Payer: Self-pay

## 2020-09-16 DIAGNOSIS — R531 Weakness: Secondary | ICD-10-CM | POA: Diagnosis not present

## 2020-09-16 DIAGNOSIS — Z87891 Personal history of nicotine dependence: Secondary | ICD-10-CM | POA: Diagnosis not present

## 2020-09-16 DIAGNOSIS — R0603 Acute respiratory distress: Secondary | ICD-10-CM

## 2020-09-16 DIAGNOSIS — E039 Hypothyroidism, unspecified: Secondary | ICD-10-CM | POA: Insufficient documentation

## 2020-09-16 DIAGNOSIS — Z85828 Personal history of other malignant neoplasm of skin: Secondary | ICD-10-CM | POA: Insufficient documentation

## 2020-09-16 DIAGNOSIS — I130 Hypertensive heart and chronic kidney disease with heart failure and stage 1 through stage 4 chronic kidney disease, or unspecified chronic kidney disease: Secondary | ICD-10-CM | POA: Insufficient documentation

## 2020-09-16 DIAGNOSIS — R600 Localized edema: Secondary | ICD-10-CM | POA: Diagnosis not present

## 2020-09-16 DIAGNOSIS — Z8616 Personal history of COVID-19: Secondary | ICD-10-CM | POA: Diagnosis not present

## 2020-09-16 DIAGNOSIS — Z20822 Contact with and (suspected) exposure to covid-19: Secondary | ICD-10-CM | POA: Insufficient documentation

## 2020-09-16 DIAGNOSIS — R001 Bradycardia, unspecified: Secondary | ICD-10-CM

## 2020-09-16 DIAGNOSIS — R0602 Shortness of breath: Secondary | ICD-10-CM

## 2020-09-16 DIAGNOSIS — I509 Heart failure, unspecified: Principal | ICD-10-CM

## 2020-09-16 DIAGNOSIS — I5041 Acute combined systolic (congestive) and diastolic (congestive) heart failure: Secondary | ICD-10-CM | POA: Diagnosis not present

## 2020-09-16 DIAGNOSIS — Z7901 Long term (current) use of anticoagulants: Secondary | ICD-10-CM | POA: Insufficient documentation

## 2020-09-16 DIAGNOSIS — I5023 Acute on chronic systolic (congestive) heart failure: Secondary | ICD-10-CM | POA: Diagnosis not present

## 2020-09-16 DIAGNOSIS — R0682 Tachypnea, not elsewhere classified: Secondary | ICD-10-CM

## 2020-09-16 DIAGNOSIS — E871 Hypo-osmolality and hyponatremia: Secondary | ICD-10-CM | POA: Diagnosis not present

## 2020-09-16 DIAGNOSIS — N1832 Chronic kidney disease, stage 3b: Secondary | ICD-10-CM | POA: Insufficient documentation

## 2020-09-16 DIAGNOSIS — I4821 Permanent atrial fibrillation: Secondary | ICD-10-CM

## 2020-09-16 DIAGNOSIS — R5383 Other fatigue: Secondary | ICD-10-CM

## 2020-09-16 DIAGNOSIS — Z79899 Other long term (current) drug therapy: Secondary | ICD-10-CM | POA: Insufficient documentation

## 2020-09-16 DIAGNOSIS — R42 Dizziness and giddiness: Secondary | ICD-10-CM

## 2020-09-16 LAB — CBC WITH DIFFERENTIAL/PLATELET
Abs Immature Granulocytes: 0.05 10*3/uL (ref 0.00–0.07)
Basophils Absolute: 0 10*3/uL (ref 0.0–0.1)
Basophils Relative: 0 %
Eosinophils Absolute: 0 10*3/uL (ref 0.0–0.5)
Eosinophils Relative: 0 %
HCT: 32.7 % — ABNORMAL LOW (ref 39.0–52.0)
Hemoglobin: 10.4 g/dL — ABNORMAL LOW (ref 13.0–17.0)
Immature Granulocytes: 1 %
Lymphocytes Relative: 3 %
Lymphs Abs: 0.3 10*3/uL — ABNORMAL LOW (ref 0.7–4.0)
MCH: 29 pg (ref 26.0–34.0)
MCHC: 31.8 g/dL (ref 30.0–36.0)
MCV: 91.1 fL (ref 80.0–100.0)
Monocytes Absolute: 1.6 10*3/uL — ABNORMAL HIGH (ref 0.1–1.0)
Monocytes Relative: 18 %
Neutro Abs: 6.7 10*3/uL (ref 1.7–7.7)
Neutrophils Relative %: 78 %
Platelets: 388 10*3/uL (ref 150–400)
RBC: 3.59 MIL/uL — ABNORMAL LOW (ref 4.22–5.81)
RDW: 14.8 % (ref 11.5–15.5)
WBC: 8.6 10*3/uL (ref 4.0–10.5)
nRBC: 0.9 % — ABNORMAL HIGH (ref 0.0–0.2)

## 2020-09-16 LAB — FERRITIN: Ferritin: 26 ng/mL (ref 24–336)

## 2020-09-16 LAB — LACTIC ACID, PLASMA: Lactic Acid, Venous: 1.9 mmol/L (ref 0.5–1.9)

## 2020-09-16 LAB — COMPREHENSIVE METABOLIC PANEL
ALT: 16 U/L (ref 0–44)
AST: 21 U/L (ref 15–41)
Albumin: 3.3 g/dL — ABNORMAL LOW (ref 3.5–5.0)
Alkaline Phosphatase: 77 U/L (ref 38–126)
Anion gap: 15 (ref 5–15)
BUN: 33 mg/dL — ABNORMAL HIGH (ref 8–23)
CO2: 24 mmol/L (ref 22–32)
Calcium: 9.1 mg/dL (ref 8.9–10.3)
Chloride: 88 mmol/L — ABNORMAL LOW (ref 98–111)
Creatinine, Ser: 1.86 mg/dL — ABNORMAL HIGH (ref 0.61–1.24)
GFR, Estimated: 34 mL/min — ABNORMAL LOW (ref >60)
Glucose, Bld: 107 mg/dL — ABNORMAL HIGH (ref 70–99)
Potassium: 5.1 mmol/L (ref 3.5–5.1)
Sodium: 127 mmol/L — ABNORMAL LOW (ref 135–145)
Total Bilirubin: 0.8 mg/dL (ref 0.3–1.2)
Total Protein: 7.2 g/dL (ref 6.5–8.1)

## 2020-09-16 LAB — TRIGLYCERIDES: Triglycerides: 87 mg/dL (ref <150)

## 2020-09-16 LAB — C-REACTIVE PROTEIN: CRP: 0.7 mg/dL (ref <1.0)

## 2020-09-16 LAB — D-DIMER, QUANTITATIVE: D-Dimer, Quant: 2.65 ug/mL-FEU — ABNORMAL HIGH (ref 0.00–0.50)

## 2020-09-16 LAB — RESP PANEL BY RT-PCR (FLU A&B, COVID) ARPGX2
Influenza A by PCR: NEGATIVE
Influenza B by PCR: NEGATIVE
SARS Coronavirus 2 by RT PCR: NEGATIVE

## 2020-09-16 LAB — FIBRINOGEN: Fibrinogen: 323 mg/dL (ref 210–475)

## 2020-09-16 LAB — MAGNESIUM: Magnesium: 2.4 mg/dL (ref 1.7–2.4)

## 2020-09-16 LAB — PROCALCITONIN: Procalcitonin: 0.18 ng/mL

## 2020-09-16 LAB — BRAIN NATRIURETIC PEPTIDE: B Natriuretic Peptide: 2004.7 pg/mL — ABNORMAL HIGH (ref 0.0–100.0)

## 2020-09-16 LAB — LACTATE DEHYDROGENASE: LDH: 150 U/L (ref 98–192)

## 2020-09-16 MED ORDER — TORSEMIDE 20 MG PO TABS: 40.0000 mg | ORAL_TABLET | Freq: Two times a day (BID) | ORAL | Status: DC

## 2020-09-16 MED ORDER — SODIUM CHLORIDE 0.9% FLUSH
3.0000 mL | Freq: Two times a day (BID) | INTRAVENOUS | Status: DC
  Administered 2020-09-16 – 2020-09-18 (×4): 3 mL via INTRAVENOUS

## 2020-09-16 MED ORDER — POTASSIUM CHLORIDE CRYS ER 20 MEQ PO TBCR: 20.0000 meq | EXTENDED_RELEASE_TABLET | Freq: Every day | ORAL | Status: DC

## 2020-09-16 MED ORDER — GABAPENTIN 100 MG PO CAPS
100.0000 mg | ORAL_CAPSULE | Freq: Every day | ORAL | Status: DC
Start: 2020-09-16 — End: 2020-09-18
  Administered 2020-09-16 – 2020-09-17 (×2): 100 mg via ORAL
  Filled 2020-09-16 (×2): qty 1

## 2020-09-16 MED ORDER — ALPRAZOLAM 0.5 MG PO TABS
0.5000 mg | ORAL_TABLET | Freq: Two times a day (BID) | ORAL | Status: DC | PRN
  Administered 2020-09-17: 0.5 mg via ORAL
  Filled 2020-09-16: qty 1

## 2020-09-16 MED ORDER — POTASSIUM CHLORIDE CRYS ER 20 MEQ PO TBCR
20.0000 meq | EXTENDED_RELEASE_TABLET | Freq: Once | ORAL | Status: AC
  Administered 2020-09-16: 20 meq via ORAL
  Filled 2020-09-16: qty 1

## 2020-09-16 MED ORDER — ACETAMINOPHEN 325 MG PO TABS: 650.0000 mg | ORAL_TABLET | ORAL | Status: DC | PRN

## 2020-09-16 MED ORDER — FUROSEMIDE 10 MG/ML IJ SOLN: 40.0000 mg | Freq: Once | INTRAMUSCULAR | Status: DC

## 2020-09-16 MED ORDER — POLYETHYLENE GLYCOL 3350 17 G PO PACK
17.0000 g | PACK | Freq: Every day | ORAL | Status: DC
  Administered 2020-09-16 – 2020-09-18 (×2): 17 g via ORAL
  Filled 2020-09-16 (×4): qty 1

## 2020-09-16 MED ORDER — SODIUM CHLORIDE 0.9 % IV SOLN: 250.0000 mL | INTRAVENOUS | Status: DC | PRN

## 2020-09-16 MED ORDER — LATANOPROST 0.005 % OP SOLN
1.0000 [drp] | Freq: Every day | OPHTHALMIC | Status: DC
  Administered 2020-09-16 – 2020-09-18 (×2): 1 [drp] via OPHTHALMIC
  Filled 2020-09-16 (×2): qty 2.5

## 2020-09-16 MED ORDER — FERROUS SULFATE 325 (65 FE) MG PO TABS
325.0000 mg | ORAL_TABLET | Freq: Every day | ORAL | Status: DC
  Administered 2020-09-17 – 2020-09-18 (×2): 325 mg via ORAL
  Filled 2020-09-16 (×3): qty 1

## 2020-09-16 MED ORDER — APIXABAN 2.5 MG PO TABS
2.5000 mg | ORAL_TABLET | Freq: Two times a day (BID) | ORAL | Status: DC
  Administered 2020-09-16 – 2020-09-18 (×5): 2.5 mg via ORAL
  Filled 2020-09-16 (×7): qty 1

## 2020-09-16 MED ORDER — ONDANSETRON HCL 4 MG/2ML IJ SOLN
4.0000 mg | Freq: Four times a day (QID) | INTRAMUSCULAR | Status: DC | PRN
  Administered 2020-09-17: 4 mg via INTRAVENOUS
  Filled 2020-09-16: qty 2

## 2020-09-16 MED ORDER — SODIUM CHLORIDE 0.9% FLUSH: 3.0000 mL | INTRAVENOUS | Status: DC | PRN

## 2020-09-16 MED ORDER — ALBUTEROL SULFATE (2.5 MG/3ML) 0.083% IN NEBU: 3.0000 mL | INHALATION_SOLUTION | RESPIRATORY_TRACT | Status: DC | PRN

## 2020-09-16 MED ORDER — LEVOTHYROXINE SODIUM 25 MCG PO TABS
25.0000 ug | ORAL_TABLET | Freq: Every day | ORAL | Status: DC
  Administered 2020-09-17 – 2020-09-19 (×3): 25 ug via ORAL
  Filled 2020-09-16 (×3): qty 1

## 2020-09-16 MED ORDER — NITROGLYCERIN 2 % TD OINT
1.0000 [in_us] | TOPICAL_OINTMENT | Freq: Four times a day (QID) | TRANSDERMAL | Status: DC
  Administered 2020-09-16: 1 [in_us] via TOPICAL
  Filled 2020-09-16: qty 1

## 2020-09-16 NOTE — ED Notes (Signed)
Sandy Howland([phone]) called for an update on patient's status.  Thank you

## 2020-09-16 NOTE — ED Triage Notes (Signed)
Pt presents with increased SOB post covid, was given antibiotics last week , 89 upon assessment

## 2020-09-16 NOTE — Consult Note (Signed)
Cardiology Consultation:   Patient ID: David Acosta MRN: [id number]; DOB: 1925-12-28  Admit date: 09/16/2020 Date of Consult: 09/16/2020  Primary Care Provider: Christain Sacramento, MD [place] Cardiologist: No primary care provider on file. Dr. Johnsie Cancel [facility] Electrophysiologist:  None    Patient Profile:   David Acosta is a 87 y.o. male with a hx of  who is being seen today for the evaluation of bradycardia at the request of Dr. Roosevelt Locks.  History of Present Illness:  Prior records show that Mr. David Acosta " 87 y.o. f/u  for afib and CHF. History of  chronic PVCls since 1970's Normal cath in 2002. Normal ETT 2014 Holter only 3% PVC;s EF 60-65% by echo Did better on verapamil thought lopressor made him worse Admitted August 2020  with chest pain precordial radiating to back. He had LE edema CT no dissection Thought to have diastolic CHF Rx with lasix. Echo with no significant valve disease and noted to have "new" afib on telemetry LA diameter on echo was 3.8 cm CXR with vascular congestion BNP only 147.     CHADVASC 4 Monitor 05/14/19 showed 100% chronic afib with good rate control   Myovue 06/12/19 normal no ischemia EF 67%   Has had some rectal bleeding from hemorrhoids"  He had COVID several weeks ago.  He was not hospitalized.  He also had pneumonia and was treated with a Z-Pak per his report.  He did not receive any antibody infusion.  He is fully vaccinated.  He has been feeling more tired of late.  Earlier today, one of his family members saw that he had a heart rate in the 70s.  He just felt tired.  He states usual heart rate is in the 50s.  He took his diltiazem this morning per his usual routine.  He has noted some increased swelling in his legs.  Of note, his December 21 visit with Dr. Johnsie Cancel also showed there was bilateral edema on exam.  Currently, he is not having any chest discomfort or shortness of breath.  He denies any palpitations or dizziness.  He has not  passed out.    Past Medical History:  Diagnosis Date  . Cancer (HCC)    Skin  . Dysphagia 10/05/2017  . Glaucoma   . Hypertension   . Macular degeneration   . Premature ventricular contraction 2013    Past Surgical History:  Procedure Laterality Date  . CATARACT EXTRACTION W/PHACO Left 01/28/2014   Procedure: CATARACT EXTRACTION PHACO AND INTRAOCULAR LENS PLACEMENT (IOC);  Surgeon: Elta Guadeloupe T. Gershon Crane, MD;  Location: [facility];  Service: Ophthalmology;  Laterality: Left;  CDE 16.74  . CATARACT EXTRACTION W/PHACO Right 02/18/2014   Procedure: CATARACT EXTRACTION PHACO AND INTRAOCULAR LENS PLACEMENT (IOC);  Surgeon: Elta Guadeloupe T. Gershon Crane, MD;  Location: [facility];  Service: Ophthalmology;  Laterality: Right;  CDE 9.15  . cholesteoma mastoidectomy Right 2012  . Dupytrens contraction Right 2010  . ESOPHAGEAL DILATION N/A 11/23/2017   Procedure: ESOPHAGEAL DILATION;  Surgeon: Rogene Houston, MD;  Location: [facility];  Service: Endoscopy;  Laterality: N/A;  . ESOPHAGOGASTRODUODENOSCOPY N/A 11/23/2017   Procedure: ESOPHAGOGASTRODUODENOSCOPY (EGD);  Surgeon: Rogene Houston, MD;  Location: [facility];  Service: Endoscopy;  Laterality: N/A;  12:45  . NASAL SEPTUM SURGERY    . TONSILLECTOMY         Inpatient Medications: Scheduled Meds: . apixaban  2.5 mg Oral BID  . [START ON 09/17/2020] ferrous sulfate  325 mg  Oral Q breakfast  . gabapentin  100 mg Oral QHS  . latanoprost  1 drop Both Eyes QHS  . [START ON 09/17/2020] levothyroxine  25 mcg Oral Daily  . nitroGLYCERIN  1 inch Topical Q6H  . polyethylene glycol  17 g Oral Daily  . sodium chloride flush  3 mL Intravenous Q12H   Continuous Infusions: . sodium chloride     PRN Meds: sodium chloride, acetaminophen, albuterol, ALPRAZolam, ondansetron (ZOFRAN) IV, sodium chloride flush  Allergies:    Allergies  Allergen Reactions  . Capsaicin Other (See Comments)    "heart started to race and blood pressure went up"  . Fluticasone Other  (See Comments)    glaucoma  . Procardia Xl [Nifedipine] Other (See Comments)    Rapid heart beat  . Timolol Swelling    Social History:   Social History   Socioeconomic History  . Marital status: Married    Spouse name: Not on file  . Number of children: Not on file  . Years of education: Not on file  . Highest education level: Not on file  Occupational History  . Not on file  Tobacco Use  . Smoking status: Former Smoker    Quit date: 01/23/1959    Years since quitting: 61.6  . Smokeless tobacco: Never Used  Vaping Use  . Vaping Use: Never used  Substance and Sexual Activity  . Alcohol use: Yes    Comment: daily, 2.7 oz   . Drug use: No  . Sexual activity: Not on file  Other Topics Concern  . Not on file  Social History Narrative  . Not on file   Social Determinants of Health   Financial Resource Strain: Not on file  Food Insecurity: Not on file  Transportation Needs: Not on file  Physical Activity: Not on file  Stress: Not on file  Social Connections: Not on file  Intimate Partner Violence: Not on file    Family History:    Family History  Problem Relation Age of Onset  . Leukemia Mother   . Heart attack Father   . Heart failure Sister   . Heart attack Brother   . Uterine cancer Paternal Grandfather      ROS:  Please see the history of present illness.  Fatigue All other ROS reviewed and negative.     Physical Exam/Data:   Vitals:   09/16/20 1545 09/16/20 1630 09/16/20 1700 09/16/20 1745  BP: (!) 110/55 115/65 116/65 (!) 98/49  Pulse: (!) 43 (!) 43 (!) 53 (!) 56  Resp: 18 20 20 17   Temp:      TempSrc:      SpO2: 95% 97% 97% 95%  Weight:      Height:       No intake or output data in the 24 hours ending 09/16/20 1822 Last 3 Weights 09/16/2020 07/27/2020 01/21/2020  Weight (lbs) 177 lb 171 lb 179 lb 12.8 oz  Weight (kg) 80.287 kg 77.565 kg 81.557 kg     Body mass index is 26.14 kg/m.  General:  Well nourished, well developed, in no acute  distress HEENT: normal Lymph: no adenopathy Neck: no JVD Endocrine:  No thryomegaly Vascular: Palpable radial pulse Cardiac:  normal S1, S2; distant heart sounds, irregularly irregular, bradycardic; no murmur  Lungs:  clear to auscultation bilaterally, no wheezing, rhonchi or rales  Abd: soft, nontender, no hepatomegaly  Ext:  bilateral pedal  edema Musculoskeletal:  No deformities, BUE and BLE strength normal and equal Skin: warm  and dry  Neuro:  CNs 2-12 intact, no focal abnormalities noted Psych:  Normal affect   EKG:  The EKG was personally reviewed and demonstrates: Junctional escape rhythm with an approximate rate of 50, right bundle branch block pattern.  I think the computer is over counting the heart rate when it counts a heart rate of 69 bpm. Telemetry:  Telemetry was personally reviewed and demonstrates: Atrial fibrillation with heart rate in the 50-60 range, irregular  Relevant CV Studies: Low risk Myoview in 2020, ejection fraction 45% in 2021  Laboratory Data:  High Sensitivity Troponin:  No results for input(s): TROPONINIHS in the last 720 hours.   Chemistry Recent Labs  Lab 09/16/20 1309  NA 127*  K 5.1  CL 88*  CO2 24  GLUCOSE 107*  BUN 33*  CREATININE 1.86*  CALCIUM 9.1  GFRNONAA 34*  ANIONGAP 15    Recent Labs  Lab 09/16/20 1309  PROT 7.2  ALBUMIN 3.3*  AST 21  ALT 16  ALKPHOS 77  BILITOT 0.8   Hematology Recent Labs  Lab 09/16/20 1309  WBC 8.6  RBC 3.59*  HGB 10.4*  HCT 32.7*  MCV 91.1  MCH 29.0  MCHC 31.8  RDW 14.8  PLT 388   BNP Recent Labs  Lab 09/16/20 1308  BNP 2,004.7*    DDimer  Recent Labs  Lab 09/16/20 1309  DDIMER 2.65*     Radiology/Studies:  DG Chest Portable 1 View  Result Date: 09/16/2020 CLINICAL DATA:  Short of breath, bradycardia EXAM: PORTABLE CHEST 1 VIEW COMPARISON:  03/24/2019 FINDINGS: Stable large cardiac silhouette. Bilateral small pleural effusions. Mild central venous congestion. No focal  infiltrate. No overt pulmonary edema. No pneumothorax. IMPRESSION: Small bilateral pleural effusions and central venous congestion. Electronically Signed   By: Suzy Bouchard M.D.   On: 09/16/2020 12:24     Assessment and Plan:   1. Bradycardia: Hold all rate slowing drugs.  Holding diltiazem.  We will see if his heart rate increases significantly.  If his heart rate does not increase significantly, may need to consider pacemaker.  If he has any instability in terms of long pauses or hypotension, may need to consider temporary transvenous pacemaker.  I have discussed this with the patient and the son.  For now, we will hold off since his heart rate is stable and his blood pressure is stable. 2. Was on torsemide twice daily at home.  While in the hospital, can use IV furosemide as needed for edema and volume overload.  Likely a degree of acute on chronic diastolic heart failure.  Ideally, would like to start the diuretic after his diltiazem is worn out by tomorrow morning, to lower the risk of inducing hypotension. 3. Atrial fibrillation: Anticoagulated with low-dose Eliquis for stroke prevention. 4.   Bed rest.  Out of bed with assist only.  Risk Assessment/Risk Scores:        New York Heart Association (NYHA) Functional Class NYHA Class IV  CHA2DS2-VASc Score = 4  This indicates a 4.8% annual risk of stroke. The patient's score is based upon: CHF History: Yes HTN History: Yes Diabetes History: No Stroke History: No Vascular Disease History: No Age Score: 2 Gender Score: 0        For questions or updates, please contact [place] Please consult [email or web address] for contact info under    Signed, Larae Grooms, MD  09/16/2020 6:22 PM

## 2020-09-16 NOTE — H&P (Signed)
History and Physical    David Acosta:[id number] DOB: 1926-08-01 DOA: 09/16/2020  PCP: Christain Sacramento, MD (Confirm with patient/family/NH records and if not entered, this has to be entered at [facility] point of entry) Patient coming from: Home  I have personally briefly reviewed patient's old medical records in [place]  Chief Complaint: SOB  HPI: David Acosta is a 87 y.o. male with medical history significant of chronic systolic CHF with EF 31% on echo December 2021, frequent PVCs, chronic A. fib on Cardizem and Eliquis, hypothyroidism, presented with increasing shortness of breath and leg swelling.  Symptoms started 3 weeks ago and gradually getting worse, and patient self estimated gained 12 pounds in the last 2 to 3 weeks.  And the last 3 days, patient has been having trouble sleeping at night because of shortness of breath, and coughing up clear phlegm in the mornings.  Denies any fever chills no chest pains.  No palpitations.  Denies any lightheadedness or syncope episode.  ED Course: Chest x-ray showed lung congestion, heart rate ranging from 30 to 50s.  Blood pressure on the low side.  Mentation at baseline.  Blood work sodium 127 potassium 5.1.  Review of Systems: As per HPI otherwise 14 point review of systems negative.    Past Medical History:  Diagnosis Date  . Cancer (HCC)    Skin  . Dysphagia 10/05/2017  . Glaucoma   . Hypertension   . Macular degeneration   . Premature ventricular contraction 2013    Past Surgical History:  Procedure Laterality Date  . CATARACT EXTRACTION W/PHACO Left 01/28/2014   Procedure: CATARACT EXTRACTION PHACO AND INTRAOCULAR LENS PLACEMENT (IOC);  Surgeon: Elta Guadeloupe T. Gershon Crane, MD;  Location: [facility];  Service: Ophthalmology;  Laterality: Left;  CDE 16.74  . CATARACT EXTRACTION W/PHACO Right 02/18/2014   Procedure: CATARACT EXTRACTION PHACO AND INTRAOCULAR LENS PLACEMENT (IOC);  Surgeon: Elta Guadeloupe T. Gershon Crane, MD;  Location: [facility];  Service:  Ophthalmology;  Laterality: Right;  CDE 9.15  . cholesteoma mastoidectomy Right 2012  . Dupytrens contraction Right 2010  . ESOPHAGEAL DILATION N/A 11/23/2017   Procedure: ESOPHAGEAL DILATION;  Surgeon: Rogene Houston, MD;  [facility];  Service: Endoscopy;  Laterality: N/A;  . ESOPHAGOGASTRODUODENOSCOPY N/A 11/23/2017   Procedure: ESOPHAGOGASTRODUODENOSCOPY (EGD);  Surgeon: Rogene Houston, MD;  [facility];  Service: Endoscopy;  Laterality: N/A;  12:45  . NASAL SEPTUM SURGERY    . TONSILLECTOMY       reports that he quit smoking about 61 years ago. He has never used smokeless tobacco. He reports current alcohol use. He reports that he does not use drugs.  Allergies  Allergen Reactions  . Capsaicin Other (See Comments)    "heart started to race and blood pressure went up"  . Fluticasone Other (See Comments)    glaucoma  . Procardia Xl [Nifedipine] Other (See Comments)    Rapid heart beat  . Timolol Swelling    Family History  Problem Relation Age of Onset  . Leukemia Mother   . Heart attack Father   . Heart failure Sister   . Heart attack Brother   . Uterine cancer Paternal Grandfather     Prior to Admission medications   Medication Sig Start Date End Date Taking? Authorizing Provider  albuterol (VENTOLIN HFA) 108 (90 Base) MCG/ACT inhaler Inhale 2 puffs into the lungs every 4 (four) hours as needed for wheezing. 09/03/20  Yes [provider]  ALPRAZolam Duanne Moron)  0.5 MG tablet Take 0.5 mg by mouth 2 (two) times daily as needed for anxiety. 02/18/19  Yes [provider]  apixaban (ELIQUIS) 2.5 MG TABS tablet Take 1 tablet (2.5 mg total) by mouth 2 (two) times daily. 04/08/20  Yes Josue Hector, MD  diltiazem (CARDIZEM CD) 240 MG 24 hr capsule Take 240 mg by mouth daily.  01/02/14  Yes [provider]  ferrous sulfate 325 (65 FE) MG tablet Take 325 mg by mouth daily with breakfast.   Yes [provider]  latanoprost  (XALATAN) 0.005 % ophthalmic solution Place 1 drop into both eyes at bedtime.   Yes [provider]  levothyroxine (SYNTHROID) 25 MCG tablet Take 25 mcg by mouth daily. 08/21/20  Yes [provider]  Multiple Vitamins-Minerals (PRESERVISION AREDS 2) CHEW Chew 1 each by mouth daily.   Yes [provider]  polyethylene glycol (MIRALAX / GLYCOLAX) 17 g packet Take 17 g by mouth daily.   Yes [provider]  potassium chloride SA (K-DUR) 20 MEQ tablet Take 1 tablet (20 mEq total) by mouth daily. 03/27/19 01/21/20 Yes Arrien, Jimmy Picket, MD  spironolactone (ALDACTONE) 25 MG tablet TAKE 1/2 TABLET BY MOUTH EVERY DAY Patient taking differently: Take 12.5 mg by mouth daily. 05/29/20  Yes Josue Hector, MD  torsemide (DEMADEX) 20 MG tablet Take 40 mg by mouth 2 (two) times daily.  04/12/19  Yes [provider]  gabapentin (NEURONTIN) 100 MG capsule Take 100 mg by mouth at bedtime. 09/10/20   [provider]  lisinopril (ZESTRIL) 5 MG tablet Take 1 tablet (5 mg total) by mouth daily. Patient not taking: No sig reported 08/10/20 11/08/20  Josue Hector, MD    Physical Exam: Vitals:   09/16/20 1545 09/16/20 1630 09/16/20 1700 09/16/20 1745  BP: (!) 110/55 115/65 116/65 (!) 98/49  Pulse: (!) 43 (!) 43 (!) 53 (!) 56  Resp: 18 20 20 17   Temp:      TempSrc:      SpO2: 95% 97% 97% 95%  Weight:      Height:        Constitutional: NAD, calm, comfortable Vitals:   09/16/20 1545 09/16/20 1630 09/16/20 1700 09/16/20 1745  BP: (!) 110/55 115/65 116/65 (!) 98/49  Pulse: (!) 43 (!) 43 (!) 53 (!) 56  Resp: 18 20 20 17   Temp:      TempSrc:      SpO2: 95% 97% 97% 95%  Weight:      Height:       Eyes: PERRL, lids and conjunctivae normal ENMT: Mucous membranes are moist. Posterior pharynx clear of any exudate or lesions.Normal dentition.  Neck: normal, supple, no masses, no thyromegaly Respiratory: clear to auscultation bilaterally, no wheezing, no  crackles. Increasing respiratory effort. No accessory muscle use.  Cardiovascular: Irregular heart rate, no murmurs / rubs / gallops. 2+ extremity edema. 2+ pedal pulses. No carotid bruits.  Abdomen: no tenderness, no masses palpated. No hepatosplenomegaly. Bowel sounds positive.  Musculoskeletal: no clubbing / cyanosis. No joint deformity upper and lower extremities. Good ROM, no contractures. Normal muscle tone.  Skin: no rashes, lesions, ulcers. No induration Neurologic: CN 2-12 grossly intact. Sensation intact, DTR normal. Strength 5/5 in all 4.  Psychiatric: Normal judgment and insight. Alert and oriented x 3. Normal mood.     Labs on Admission: I have personally reviewed following labs and imaging studies  CBC: Recent Labs  Lab 09/16/20 1309  WBC 8.6  NEUTROABS 6.7  HGB 10.4*  HCT 32.7*  MCV 91.1  PLT 734   Basic Metabolic Panel: Recent Labs  Lab 09/16/20 1309  NA 127*  K 5.1  CL 88*  CO2 24  GLUCOSE 107*  BUN 33*  CREATININE 1.86*  CALCIUM 9.1  MG 2.4   GFR: Estimated Creatinine Clearance: 25.3 mL/min (A) (by C-G formula based on SCr of 1.86 mg/dL (H)). Liver Function Tests: Recent Labs  Lab 09/16/20 1309  AST 21  ALT 16  ALKPHOS 77  BILITOT 0.8  PROT 7.2  ALBUMIN 3.3*   No results for input(s): LIPASE, AMYLASE in the last 168 hours. No results for input(s): AMMONIA in the last 168 hours. Coagulation Profile: No results for input(s): INR, PROTIME in the last 168 hours. Cardiac Enzymes: No results for input(s): CKTOTAL, CKMB, CKMBINDEX, TROPONINI in the last 168 hours. BNP (last 3 results) No results for input(s): PROBNP in the last 8760 hours. HbA1C: No results for input(s): HGBA1C in the last 72 hours. CBG: No results for input(s): GLUCAP in the last 168 hours. Lipid Profile: Recent Labs    09/16/20 1309  TRIG 87   Thyroid Function Tests: No results for input(s): TSH, T4TOTAL, FREET4, T3FREE, THYROIDAB in the last 72 hours. Anemia  Panel: Recent Labs    09/16/20 1309  FERRITIN 26   Urine analysis: No results found for: COLORURINE, APPEARANCEUR, LABSPEC, [place], GLUCOSEU, HGBUR, BILIRUBINUR, KETONESUR, PROTEINUR, UROBILINOGEN, NITRITE, LEUKOCYTESUR  Radiological Exams on Admission: DG Chest Portable 1 View  Result Date: 09/16/2020 CLINICAL DATA:  Short of breath, bradycardia EXAM: PORTABLE CHEST 1 VIEW COMPARISON:  03/24/2019 FINDINGS: Stable large cardiac silhouette. Bilateral small pleural effusions. Mild central venous congestion. No focal infiltrate. No overt pulmonary edema. No pneumothorax. IMPRESSION: Small bilateral pleural effusions and central venous congestion. Electronically Signed   By: Suzy Bouchard M.D.   On: 09/16/2020 12:24    EKG: Independently reviewed.  A. fib, bradycardia versus junctional rhythm  Assessment/Plan Active Problems:   Acute CHF (congestive heart failure) (HCC)   CHF (congestive heart failure) ([place])  (please populate well all problems here in Problem List. (For example, if patient is on BP meds at home and you resume or decide to hold them, it is a problem that needs to be her. Same for CAD, COPD, HLD and so on)  Acute on chronic systolic CHF decompensation -Likely related to persistent bradycardia/depressed output. -Discussed with on-call cardiologist at bedside, recommend discontinue all rate control medications -Continue p.o. Lasix  Symptomatic bradycardia -Hold rate control medications -Blood pressure stable, no indication for emergency PM.  Chronic A. Fib -Hold Cardizem, continue Eliquis  Hyponatremia secondary to CHF decompensation -Fluid overload secondary CHF decompensation -Continue p.o. Lasix given patient's age to correct volume status first. -Long-term prognosis poor.  Discussed with patient at bedside, and reviewed patient CODE STATUS, patient desires partial CODE STATUS, as he prefers no chest compression in case of cardiac arrest, but feels if short term  intubation is still desirable if it necessary in condition of a reversible event.  Hypothyroidism -Check TSH   DVT prophylaxis: Eliquis  code Status: Partial Code, see above Family Communication: Daughter over phone Disposition Plan: Expect more than 2 midnight hospital stay, will also order PT evaluation. Consults called: Cardiology Admission status: PCU   Lequita Halt MD [facility] Pager [phone]  09/16/2020, 6:09 PM

## 2020-09-16 NOTE — ED Provider Notes (Signed)
[facility] EMERGENCY DEPARTMENT Provider Note   CSN: [id number] Arrival date & time: 09/16/20  1146     History Chief Complaint  Patient presents with  . Bradycardia    David Acosta is a 87 y.o. male.  The history is provided by the patient and medical records. No language interpreter was used.     87 year old male significant history of hypertension, CHF, atrial fibrillation, recently diagnosed with COVID-19, sent here from urgent care center via EMS for evaluation of hypoxia.  Patient report for the past 3 days he endorsed increased generalized weakness, having some chills, increased shortness of breath and decrease in appetite.  He went to urgent care but was sent here because his oxygen was low.  Patient otherwise denies having any active pain.  He denies any fever, headache, chest pain, productive cough, abdominal pain dysuria focal numbness or focal weakness.  He did report tested positive for COVID-19 approximately 3 weeks ago when he was seen by his cardiologist for follow-up of his atrial fibrillation that he is currently on Eliquis for.  He denies having significant symptoms at that time but states Covid test was obtained and was positive.  He was also given antibiotic for pneumonia which he just finished recently.  He has been fully vaccinated for COVID-19 including booster.  He lives at home with family.  He does complain of some mild congestion only and productive of phlegm.  He denies having heart palpitation.  Does endorse decrease in appetite.  He admits that he has been retaining fluid within the past few weeks.  Past Medical History:  Diagnosis Date  . Cancer (HCC)    Skin  . Dysphagia 10/05/2017  . Glaucoma   . Hypertension   . Macular degeneration   . Premature ventricular contraction 2013    Patient Active Problem List   Diagnosis Date Noted  . Chest pain 03/24/2019  . Acute diastolic CHF (congestive heart failure) ([place]) 03/24/2019  .  Unspecified atrial fibrillation ([place]) 03/24/2019  . Hypokalemia 03/24/2019  . Diastolic heart failure ([place]) 03/24/2019  . Dysphagia 10/05/2017  . Esophageal dysphagia 10/05/2017  . PVC (premature ventricular contraction) 03/12/2013  . HTN (hypertension) 03/12/2013    Past Surgical History:  Procedure Laterality Date  . CATARACT EXTRACTION W/PHACO Left 01/28/2014   Procedure: CATARACT EXTRACTION PHACO AND INTRAOCULAR LENS PLACEMENT (IOC);  Surgeon: Elta Guadeloupe T. Gershon Crane, MD;  Location: [facility];  Service: Ophthalmology;  Laterality: Left;  CDE 16.74  . CATARACT EXTRACTION W/PHACO Right 02/18/2014   Procedure: CATARACT EXTRACTION PHACO AND INTRAOCULAR LENS PLACEMENT (IOC);  Surgeon: Elta Guadeloupe T. Gershon Crane, MD;  Location: [facility];  Service: Ophthalmology;  Laterality: Right;  CDE 9.15  . cholesteoma mastoidectomy Right 2012  . Dupytrens contraction Right 2010  . ESOPHAGEAL DILATION N/A 11/23/2017   Procedure: ESOPHAGEAL DILATION;  Surgeon: Rogene Houston, MD;  Location: [facility];  Service: Endoscopy;  Laterality: N/A;  . ESOPHAGOGASTRODUODENOSCOPY N/A 11/23/2017   Procedure: ESOPHAGOGASTRODUODENOSCOPY (EGD);  Surgeon: Rogene Houston, MD;  Location: [facility];  Service: Endoscopy;  Laterality: N/A;  12:45  . NASAL SEPTUM SURGERY    . TONSILLECTOMY         Family History  Problem Relation Age of Onset  . Leukemia Mother   . Heart attack Father   . Heart failure Sister   . Heart attack Brother   . Uterine cancer Paternal Grandfather     Social History   Tobacco Use  . Smoking status:  Former Smoker    Quit date: 01/23/1959    Years since quitting: 61.6  . Smokeless tobacco: Never Used  Vaping Use  . Vaping Use: Never used  Substance Use Topics  . Alcohol use: Yes    Comment: daily, 2.7 oz   . Drug use: No    Home Medications Prior to Admission medications   Medication Sig Start Date End Date Taking? Authorizing Provider  ALPRAZolam Duanne Moron) 0.5 MG tablet Take 0.5 mg by mouth 2  (two) times daily as needed for anxiety. 02/18/19   [provider]  apixaban (ELIQUIS) 2.5 MG TABS tablet Take 1 tablet (2.5 mg total) by mouth 2 (two) times daily. 04/08/20   Josue Hector, MD  diltiazem (CARDIZEM CD) 240 MG 24 hr capsule Take 240 mg by mouth daily.  01/02/14   [provider]  latanoprost (XALATAN) 0.005 % ophthalmic solution Place 1 drop into both eyes at bedtime.    [provider]  lisinopril (ZESTRIL) 5 MG tablet Take 1 tablet (5 mg total) by mouth daily. 08/10/20 11/08/20  Josue Hector, MD  Multiple Vitamins-Minerals (PRESERVISION AREDS 2) CHEW Chew 1 each by mouth daily.    [provider]  potassium chloride SA (K-DUR) 20 MEQ tablet Take 1 tablet (20 mEq total) by mouth daily. 03/27/19 01/21/20  Arrien, Jimmy Picket, MD  spironolactone (ALDACTONE) 25 MG tablet TAKE 1/2 TABLET BY MOUTH EVERY DAY 05/29/20   Josue Hector, MD  torsemide (DEMADEX) 20 MG tablet Take 40 mg by mouth 2 (two) times daily.  04/12/19   [provider]    Allergies    Capsaicin, Fluticasone, Procardia xl [nifedipine], and Timolol  Review of Systems   Review of Systems  All other systems reviewed and are negative.   Physical Exam Updated Vital Signs BP (!) 114/54 (BP Location: Right Arm)   Pulse (!) 49   Temp (!) 96.4 F (35.8 C) (Axillary)   Resp 14   Ht 5\' 9"  (1.753 m)   Wt 80.3 kg   SpO2 97%   BMI 26.14 kg/m    Physical Exam Vitals and nursing note reviewed.  Constitutional:      General: He is not in acute distress.    Appearance: He is well-developed and well-nourished.  HENT:     Head: Atraumatic.  Eyes:     Conjunctiva/sclera: Conjunctivae normal.  Cardiovascular:     Rate and Rhythm: Bradycardia present.     Pulses: Normal pulses.     Heart sounds: Normal heart sounds.  Pulmonary:     Comments: Decreased breath sounds with crackles heard on lung bases. Abdominal:     General: There is distension.     Palpations: Abdomen  is soft.     Comments: Abdomen is distended but nontender.  Musculoskeletal:     Cervical back: Neck supple.     Right lower leg: Edema present.     Left lower leg: Edema present.     Comments: 3+ pitting edema to bilateral lower extremity extending towards the knees  Skin:    Findings: No rash.  Neurological:     Mental Status: He is alert and oriented to person, place, and time.  Psychiatric:        Mood and Affect: Mood and affect and mood normal.     ED Results / Procedures / Treatments   Labs (all labs ordered are listed, but only abnormal results are displayed) Labs Reviewed  BRAIN NATRIURETIC PEPTIDE - Abnormal; Notable  for the following components:      Result Value   B Natriuretic Peptide 2,004.7 (*)    All other components within normal limits  CBC WITH DIFFERENTIAL/PLATELET - Abnormal; Notable for the following components:   RBC 3.59 (*)    Hemoglobin 10.4 (*)    HCT 32.7 (*)    nRBC 0.9 (*)    Lymphs Abs 0.3 (*)    Monocytes Absolute 1.6 (*)    All other components within normal limits  COMPREHENSIVE METABOLIC PANEL - Abnormal; Notable for the following components:   Sodium 127 (*)    Chloride 88 (*)    Glucose, Bld 107 (*)    BUN 33 (*)    Creatinine, Ser 1.86 (*)    Albumin 3.3 (*)    GFR, Estimated 34 (*)    All other components within normal limits  D-DIMER, QUANTITATIVE (NOT AT [facility]) - Abnormal; Notable for the following components:   D-Dimer, Quant 2.65 (*)    All other components within normal limits  RESP PANEL BY RT-PCR (FLU A&B, COVID) ARPGX2  CULTURE, BLOOD (ROUTINE X 2)  CULTURE, BLOOD (ROUTINE X 2)  MAGNESIUM  LACTIC ACID, PLASMA  PROCALCITONIN  LACTATE DEHYDROGENASE  FERRITIN  FIBRINOGEN  C-REACTIVE PROTEIN  TRIGLYCERIDES    EKG EKG Interpretation  Date/Time:  Wednesday September 16 2020 11:52:53 EST Ventricular Rate:  69 PR Interval:    QRS Duration: 122 QT Interval:  409 QTC Calculation: 439 R Axis:   -119 Text  Interpretation: Atrial fibrillation Nonspecific IVCD with LAD Nonspecific T abnormalities, diffuse leads Lead(s) III V1 V2 V3 were not used for morphology analysis Junctional bradycardia LOW VOLTAGE Confirmed by Blanchie Dessert [phone]) on 09/16/2020 1:00:03 PM   Radiology DG Chest Portable 1 View  Result Date: 09/16/2020 CLINICAL DATA:  Short of breath, bradycardia EXAM: PORTABLE CHEST 1 VIEW COMPARISON:  03/24/2019 FINDINGS: Stable large cardiac silhouette. Bilateral small pleural effusions. Mild central venous congestion. No focal infiltrate. No overt pulmonary edema. No pneumothorax. IMPRESSION: Small bilateral pleural effusions and central venous congestion. Electronically Signed   By: Suzy Bouchard M.D.   On: 09/16/2020 12:24    Procedures .Critical Care Performed by: Domenic Moras, PA-C Authorized by: Domenic Moras, PA-C   Critical care provider statement:    Critical care time (minutes):  40   Critical care was time spent personally by me on the following activities:  Discussions with consultants, evaluation of patient's response to treatment, examination of patient, ordering and performing treatments and interventions, ordering and review of laboratory studies, ordering and review of radiographic studies, pulse oximetry, re-evaluation of patient's condition, obtaining history from patient or surrogate and review of old charts     Medications Ordered in ED Medications  nitroGLYCERIN (NITROGLYN) 2 % ointment 1 inch (1 inch Topical Given 09/16/20 1312)  potassium chloride SA (KLOR-CON) CR tablet 20 mEq (20 mEq Oral Given 09/16/20 1313)    ED Course  I have reviewed the triage vital signs and the nursing notes.  Pertinent labs & imaging results that were available during my care of the patient were reviewed by me and considered in my medical decision making (see chart for details).    MDM Rules/Calculators/A&P                          BP 115/65   Pulse (!) 43   Temp (!) 96.4 F  (35.8 C) (Axillary)   Resp 20   Ht 5\' 9"  (  1.753 m)   Wt 80.3 kg   SpO2 97%   BMI 26.14 kg/m   Final Clinical Impression(s) / ED Diagnoses Final diagnoses:  Acute on chronic congestive heart failure, unspecified heart failure type (HCC)  Symptomatic bradycardia  Hyponatremia    Rx / DC Orders ED Discharge Orders    None     12:14 PM Patient here with generalized weakness and fatigue with increased shortness of breath for the past 3 days.  Was diagnosed positive for COVID-19 3 weeks ago and was treated with antibiotic at some point.  Went to urgent care today for his symptoms and was found to be bradycardic with a heart rate in the 20s-30s.  Sent here for further evaluation.  Patient does have heart rate in the 30-40s in the room.  He is mentating appropriately.  He is not hypotensive.  He is not hypoxic.  He does appears to be fluid overload with a distended abdomen and.  Edema to his lower extremities.  Lung sounds wet on exam.  He is not on a beta-blocker but he is on ACE inhibitor and calcium channel blocker for his hypertension.  Does have history of CHF with an EF of 45%  4:07 PM Cardiology was consulted and will be involve in pt care.  Will consult medicine for admission of symptomatic bradycardia with subsequent CHF exacerbation.  Patient may need pacer if heart rate stays consistently below 40.  5:05 PM Sodium of 127.  Creatinine is 1.86, worse than previous value.  Elevated D-dimer of 2.65.  Elevated BNP of 2000.  Screening Covid test negative.  Chest x-ray showing small bilateral pleural effusion and central venous congestion.  EKG with atrial fibrillation.  Appreciate consultation from [facility] hospitalist, Dr. Roosevelt Locks who agrees to admit pt for symptomatic anemia and acute CHF.     Domenic Moras, PA-C 09/16/20 1708    Blanchie Dessert, MD 09/21/20 1623

## 2020-09-16 NOTE — ED Triage Notes (Signed)
Patient is being discharged from the Urgent Care and sent to the Emergency Department via ems . Per Kirkland Hun, patient is in need of higher level of care due to hypoxia . Patient is aware and verbalizes understanding of plan of care.  Vitals:   09/16/20 1035  BP: 125/74  Pulse: (!) 50  Resp: (!) 26  Temp: (!) 96.5 F (35.8 C)  SpO2: (!) 88%

## 2020-09-16 NOTE — ED Triage Notes (Signed)
Pt [name] from urgent care for bradycardia. Per EMS Urgent care stated pt's heartrate was in the 20's. Pt states he went to urgent care to be evaluated for [facility]. Pt states he had COVID and Pneumonia 3 weeks ago and just finished antibiotics. Pt has a hx of A-fib and takes Cardizem and normally his HR is in the 50's.

## 2020-09-16 NOTE — ED Notes (Signed)
RN messaged pharmacy for missing Xalatan

## 2020-09-16 NOTE — Discharge Instructions (Signed)
To ER via EMS for further evaluation and treatment

## 2020-09-16 NOTE — ED Provider Notes (Signed)
[facility]    CSN: [id number] Arrival date & time: 09/16/20  1024      History   Chief Complaint Chief Complaint  Patient presents with  . Shortness of Breath    HPI IAAN OREGEL is a 87 y.o. male.   Son reports that the patient has been experiencing SOB for the last 2 weeks. Just finished Augmentin course as well as azithromycin. States that he did not get much better from this. Reports fatigue, weakness, decreased appetite, dizziness. States that they have been trying to adjust one of his cardiac medications, but cannot recall which one. Denies nausea, vomiting, diarrhea, fever, rash, other symptoms.  ROS per HPI  The history is provided by the patient and a relative.  Shortness of Breath   Past Medical History:  Diagnosis Date  . Cancer (HCC)    Skin  . Dysphagia 10/05/2017  . Glaucoma   . Hypertension   . Macular degeneration   . Premature ventricular contraction 2013    Patient Active Problem List   Diagnosis Date Noted  . Chest pain 03/24/2019  . Acute diastolic CHF (congestive heart failure) ([place]) 03/24/2019  . Unspecified atrial fibrillation ([place]) 03/24/2019  . Hypokalemia 03/24/2019  . Diastolic heart failure ([place]) 03/24/2019  . Dysphagia 10/05/2017  . Esophageal dysphagia 10/05/2017  . PVC (premature ventricular contraction) 03/12/2013  . HTN (hypertension) 03/12/2013    Past Surgical History:  Procedure Laterality Date  . CATARACT EXTRACTION W/PHACO Left 01/28/2014   Procedure: CATARACT EXTRACTION PHACO AND INTRAOCULAR LENS PLACEMENT (IOC);  Surgeon: Elta Guadeloupe T. Gershon Crane, MD;  Location: [facility];  Service: Ophthalmology;  Laterality: Left;  CDE 16.74  . CATARACT EXTRACTION W/PHACO Right 02/18/2014   Procedure: CATARACT EXTRACTION PHACO AND INTRAOCULAR LENS PLACEMENT (IOC);  Surgeon: Elta Guadeloupe T. Gershon Crane, MD;  Location: [facility];  Service: Ophthalmology;  Laterality: Right;  CDE 9.15  . cholesteoma mastoidectomy Right 2012  . Dupytrens contraction  Right 2010  . ESOPHAGEAL DILATION N/A 11/23/2017   Procedure: ESOPHAGEAL DILATION;  Surgeon: Rogene Houston, MD;  [facility];  Service: Endoscopy;  Laterality: N/A;  . ESOPHAGOGASTRODUODENOSCOPY N/A 11/23/2017   Procedure: ESOPHAGOGASTRODUODENOSCOPY (EGD);  Surgeon: Rogene Houston, MD;  Location[facility];  Service: Endoscopy;  Laterality: N/A;  12:45  . NASAL SEPTUM SURGERY    . TONSILLECTOMY         Home Medications    Prior to Admission medications   Medication Sig Start Date End Date Taking? Authorizing Provider  albuterol (VENTOLIN HFA) 108 (90 Base) MCG/ACT inhaler Inhale 2 puffs into the lungs every 4 (four) hours as needed for wheezing. 09/03/20   [provider]  ALPRAZolam Duanne Moron) 0.5 MG tablet Take 0.5 mg by mouth 2 (two) times daily as needed for anxiety. 02/18/19   [provider]  apixaban (ELIQUIS) 2.5 MG TABS tablet Take 1 tablet (2.5 mg total) by mouth 2 (two) times daily. 04/08/20   Josue Hector, MD  diltiazem (CARDIZEM CD) 240 MG 24 hr capsule Take 240 mg by mouth daily.  01/02/14   [provider]  ferrous sulfate 325 (65 FE) MG tablet Take 325 mg by mouth daily with breakfast.    [provider]  gabapentin (NEURONTIN) 100 MG capsule Take 100 mg by mouth at bedtime. 09/10/20   [provider]  latanoprost (XALATAN) 0.005 % ophthalmic solution Place 1 drop into both eyes at bedtime.    [provider]  levothyroxine (SYNTHROID) 25 MCG tablet  Take 25 mcg by mouth daily. 08/21/20   [provider]  lisinopril (ZESTRIL) 5 MG tablet Take 1 tablet (5 mg total) by mouth daily. Patient not taking: No sig reported 08/10/20 11/08/20  Josue Hector, MD  Multiple Vitamins-Minerals (PRESERVISION AREDS 2) CHEW Chew 1 each by mouth daily.    [provider]  polyethylene glycol (MIRALAX / GLYCOLAX) 17 g packet Take 17 g by mouth daily.    [provider]  potassium chloride SA (K-DUR) 20  MEQ tablet Take 1 tablet (20 mEq total) by mouth daily. 03/27/19 01/21/20  Arrien, Jimmy Picket, MD  spironolactone (ALDACTONE) 25 MG tablet TAKE 1/2 TABLET BY MOUTH EVERY DAY Patient taking differently: Take 12.5 mg by mouth daily. 05/29/20   Josue Hector, MD  torsemide (DEMADEX) 20 MG tablet Take 40 mg by mouth 2 (two) times daily.  04/12/19   [provider]    Family History Family History  Problem Relation Age of Onset  . Leukemia Mother   . Heart attack Father   . Heart failure Sister   . Heart attack Brother   . Uterine cancer Paternal Grandfather     Social History Social History   Tobacco Use  . Smoking status: Former Smoker    Quit date: 01/23/1959    Years since quitting: 61.6  . Smokeless tobacco: Never Used  Vaping Use  . Vaping Use: Never used  Substance Use Topics  . Alcohol use: Yes    Comment: daily, 2.7 oz   . Drug use: No     Allergies   Capsaicin, Fluticasone, Procardia xl [nifedipine], and Timolol   Review of Systems Review of Systems  Respiratory: Positive for shortness of breath.      Physical Exam Triage Vital Signs ED Triage Vitals [09/16/20 1035]  Enc Vitals Group     BP 125/74     Pulse Rate (!) 50     Resp (!) 26     Temp (!) 96.5 F (35.8 C)     Temp src      SpO2 (!) 88 %     Weight      Height      Head Circumference      Peak Flow      Pain Score      Pain Loc      Pain Edu?      Excl. in [place]?    No data found.  Updated Vital Signs BP 125/74   Pulse (!) 50   Temp (!) 96.5 F (35.8 C)   Resp (!) 26   SpO2 (!) 88%      Physical Exam Vitals and nursing note reviewed.  Constitutional:      General: He is in acute distress.     Appearance: He is well-developed and well-nourished. He is ill-appearing.  HENT:     Head: Normocephalic and atraumatic.  Eyes:     Conjunctiva/sclera: Conjunctivae normal.  Cardiovascular:     Rate and Rhythm: Regular rhythm. Bradycardia present.     Heart sounds: No  murmur heard.   Pulmonary:     Effort: Tachypnea and respiratory distress present.     Breath sounds: Examination of the right-middle field reveals decreased breath sounds. Examination of the left-middle field reveals decreased breath sounds. Examination of the right-lower field reveals decreased breath sounds and rhonchi. Examination of the left-lower field reveals decreased breath sounds and rhonchi. Decreased breath sounds and rhonchi present.  Chest:  Chest wall: No mass, deformity, tenderness, crepitus or edema. There is no dullness to percussion.  Abdominal:     Palpations: Abdomen is soft.     Tenderness: There is no abdominal tenderness.  Musculoskeletal:        General: No edema.     Cervical back: Neck supple.  Skin:    General: Skin is warm and dry.     Capillary Refill: Capillary refill takes less than 2 seconds.     Coloration: Skin is cyanotic (fingertips and lips) and pale.  Neurological:     General: No focal deficit present.     Mental Status: He is alert.  Psychiatric:        Mood and Affect: Mood and affect and mood normal.        Behavior: Behavior normal.      UC Treatments / Results  Labs (all labs ordered are listed, but only abnormal results are displayed) Labs Reviewed - No data to display  EKG     Procedures Procedures (including critical care time)  Medications Ordered in UC Medications - No data to display  Initial Impression / Assessment and Plan / UC Course  I have reviewed the triage vital signs and the nursing notes.  Pertinent labs & imaging results that were available during my care of the patient were reviewed by me and considered in my medical decision making (see chart for details).    Bradycardia Respiratory Distress Tachypnea Weakness Fatigue Dizziness SOB  Upon arrival patient is ill appearing and appears to be having difficulty catching his breath.  HR in the high 40s upon entering his room, O2 sats 88 on RA O2  started and given 4lpm via [place] to bring sats to 92% EMS activated and patient transported to ER for further evaluation and treatment Respiratory failure vs sepsis in the differential   Final Clinical Impressions(s) / UC Diagnoses   Final diagnoses:  Respiratory distress  Bradycardia  Tachypnea  Weakness  Dizziness and giddiness  Other fatigue  SOB (shortness of breath)     Discharge Instructions     To ER via EMS for further evaluation and treatment    ED Prescriptions    None     PDMP not reviewed this encounter.   Faustino Congress, NP 09/16/20 1524

## 2020-09-16 NOTE — ED Notes (Signed)
MD notified by this RN of pt HR jumping from 20s/30s to 103s

## 2020-09-16 NOTE — ED Notes (Signed)
RN paged cardiology per pt HR

## 2020-09-17 ENCOUNTER — Inpatient Hospital Stay (HOSPITAL_COMMUNITY): Payer: Medicare HMO

## 2020-09-17 ENCOUNTER — Telehealth: Payer: Self-pay

## 2020-09-17 ENCOUNTER — Observation Stay: Payer: Medicare HMO

## 2020-09-17 DIAGNOSIS — I509 Heart failure, unspecified: Secondary | ICD-10-CM | POA: Diagnosis not present

## 2020-09-17 DIAGNOSIS — R000 Tachycardia, unspecified: Secondary | ICD-10-CM

## 2020-09-17 DIAGNOSIS — R609 Edema, unspecified: Secondary | ICD-10-CM | POA: Diagnosis not present

## 2020-09-17 DIAGNOSIS — I5041 Acute combined systolic (congestive) and diastolic (congestive) heart failure: Secondary | ICD-10-CM

## 2020-09-17 DIAGNOSIS — I4821 Permanent atrial fibrillation: Secondary | ICD-10-CM | POA: Diagnosis not present

## 2020-09-17 DIAGNOSIS — R001 Bradycardia, unspecified: Secondary | ICD-10-CM

## 2020-09-17 DIAGNOSIS — R600 Localized edema: Secondary | ICD-10-CM | POA: Diagnosis not present

## 2020-09-17 LAB — BASIC METABOLIC PANEL WITH GFR
Anion gap: 12 (ref 5–15)
BUN: 33 mg/dL — ABNORMAL HIGH (ref 8–23)
CO2: 24 mmol/L (ref 22–32)
Calcium: 8.7 mg/dL — ABNORMAL LOW (ref 8.9–10.3)
Chloride: 92 mmol/L — ABNORMAL LOW (ref 98–111)
Creatinine, Ser: 1.82 mg/dL — ABNORMAL HIGH (ref 0.61–1.24)
GFR, Estimated: 34 mL/min — ABNORMAL LOW
Glucose, Bld: 103 mg/dL — ABNORMAL HIGH (ref 70–99)
Potassium: 5 mmol/L (ref 3.5–5.1)
Sodium: 128 mmol/L — ABNORMAL LOW (ref 135–145)

## 2020-09-17 MED ORDER — FUROSEMIDE 10 MG/ML IJ SOLN
40.0000 mg | Freq: Once | INTRAMUSCULAR | Status: AC
  Administered 2020-09-17: 40 mg via INTRAVENOUS
  Filled 2020-09-17: qty 4

## 2020-09-17 NOTE — ED Notes (Signed)
Pt room cleaned up. Pt repositioned in bed and assisted with breakfast.

## 2020-09-17 NOTE — ED Notes (Signed)
Breakfast Ordered 

## 2020-09-17 NOTE — Telephone Encounter (Signed)
Per Dr. Irish Lack, He will need 14 day Zio patch as outpatient to watch for bradycardia or tachycardia while Holding diltiazem.   Order placed for monitor zio patch.

## 2020-09-17 NOTE — ED Notes (Signed)
  Patient HR between 45-50 bpm on cardiac monitor.  Patient asymptomatic and A&O x 4.  Patient adjusted in the bed and given additional pillows.

## 2020-09-17 NOTE — Evaluation (Signed)
Physical Therapy Evaluation Patient Details Name: David Acosta MRN: [id number] DOB: 26-Oct-1925 Today's Date: 09/17/2020   History of Present Illness  Pt is 87 year old with past medical history significant for chronic systolic heart failure ejection fraction 45% echo 07/2020, frequent PVC, chronic A. fib on Cardizem and Eliquis, hypothyroidism who presents with increasing shortness of breath and lower extremity edema.    Patient symptoms started 3 weeks prior to admission and progressively getting worse, he has gained 12 pounds over the last 2 or 3 weeks. Pt admitted with acute on chronic CHF and bradycardia  Clinical Impression  Pt admitted with above diagnosis. Pt was min A for transfers and able to ambulate in room with HHA.  He did fatigue easily.  Pt did require O2 in order to maintain O2 saturation which is not his baseline.  Pt resides at home with wife and son who works from home.  Pt does present with decreased mobility, strength, endurance, safety, and balance. Pt currently with functional limitations due to the deficits listed below (see PT Problem List). Pt will benefit from skilled PT to increase their independence and safety with mobility to allow discharge to the venue listed below.       Follow Up Recommendations Home health PT;Supervision for mobility/OOB    Equipment Recommendations  None recommended by PT    Recommendations for Other Services       Precautions / Restrictions Precautions Precautions: Fall      Mobility  Bed Mobility Overal bed mobility: Needs Assistance Bed Mobility: Supine to Sit;Sit to Supine     Supine to sit: Min assist Sit to supine: Supervision        Transfers Overall transfer level: Needs assistance Equipment used: 1 person hand held assist Transfers: Sit to/from Stand Sit to Stand: Min guard            Ambulation/Gait Ambulation/Gait assistance: Min guard Gait Distance (Feet): 40 Feet Assistive device: 1 person hand  held assist Gait Pattern/deviations: Step-to pattern;Decreased stride length;Trunk flexed Gait velocity: decreased   General Gait Details: Pt utilized HHA or bed rail.  He was seen in ED and did not want to ambulate in hallway, difficult to use RW in small ED room wo utilized HHA and rail; DOE of 2/4  Financial trader Rankin (Stroke Patients Only)       Balance Overall balance assessment: Needs assistance Sitting-balance support: No upper extremity supported Sitting balance-Leahy Scale: Good     Standing balance support: No upper extremity supported Standing balance-Leahy Scale: Fair Standing balance comment: static no UE support; walking single UE support                             Pertinent Vitals/Pain Pain Assessment: No/denies pain    Home Living Family/patient expects to be discharged to:: Private residence Living Arrangements: Spouse/significant other;Children Available Help at Discharge: Family;Available 24 hours/day (son works from home) Type of Home: House Home Access: Stairs to enter Entrance Stairs-Rails: Psychiatric nurse of Steps: 2 Roff: One level Home Equipment: Clinical cytogeneticist - 4 wheels;Cane - single point      Prior Function Level of Independence: Needs assistance   Gait / Transfers Assistance Needed: Uses Rollator for ambulation - could ambulate short community distances  ADL's / Homemaking Assistance Needed: Pt could perform ADLs, just got a shower chair  Comments:  does not drive     Hand Dominance        Extremity/Trunk Assessment   Upper Extremity Assessment Upper Extremity Assessment: LUE deficits/detail;RUE deficits/detail RUE Deficits / Details: ROM WFL; MMT 4/5 LUE Deficits / Details: ROM WFL except trigger finger 5th digit; MMT 4/5    Lower Extremity Assessment Lower Extremity Assessment: LLE deficits/detail;RLE deficits/detail RLE Deficits / Details:  ROM WFL; MMT 5/5 LLE Deficits / Details: ROM WFL; MMT 5/5    Cervical / Trunk Assessment Cervical / Trunk Assessment: Normal  Communication   Communication: No difficulties  Cognition Arousal/Alertness: Awake/alert Behavior During Therapy: WFL for tasks assessed/performed Overall Cognitive Status: Within Functional Limits for tasks assessed                                        General Comments General comments (skin integrity, edema, etc.): Pt on 2 LPM with sats 100% rest.  Tried RA and sats 92% rest and dropped to 86% activity.  Recovered in <30 sec with 2 L.  HR upper 50's-60's    Exercises     Assessment/Plan    PT Assessment Patient needs continued PT services  PT Problem List Decreased strength;Decreased mobility;Decreased activity tolerance;Cardiopulmonary status limiting activity;Decreased balance;Decreased knowledge of use of DME       PT Treatment Interventions DME instruction;Therapeutic activities;Gait training;Therapeutic exercise;Patient/family education;Balance training;Stair training;Functional mobility training    PT Goals (Current goals can be found in the Care Plan section)  Acute Rehab PT Goals Patient Stated Goal: return home PT Goal Formulation: With patient Time For Goal Achievement: 10/01/20 Potential to Achieve Goals: Good    Frequency Min 3X/week   Barriers to discharge        Co-evaluation               AM-PAC PT "6 Clicks" Mobility  Outcome Measure Help needed turning from your back to your side while in a flat bed without using bedrails?: A Little Help needed moving from lying on your back to sitting on the side of a flat bed without using bedrails?: A Little Help needed moving to and from a bed to a chair (including a wheelchair)?: A Little Help needed standing up from a chair using your arms (e.g., wheelchair or bedside chair)?: A Little Help needed to walk in hospital room?: A Little Help needed climbing 3-5  steps with a railing? : A Little 6 Click Score: 18    End of Session Equipment Utilized During Treatment: Gait belt Activity Tolerance: Patient tolerated treatment well Patient left: in bed;with call bell/phone within reach;with bed alarm set Nurse Communication: Mobility status PT Visit Diagnosis: Unsteadiness on feet (R26.81)    Time: 6195-0932 PT Time Calculation (min) (ACUTE ONLY): 28 min   Charges:   PT Evaluation $PT Eval Moderate Complexity: 1 Mod PT Treatments $Gait Training: 8-22 mins        Abran Richard, PT Acute Rehab Services Pager [phone] [facility] [place] 09/17/2020, 4:05 PM

## 2020-09-17 NOTE — Care Management Obs Status (Signed)
[facility] NOTIFICATION   Patient Details  Name: David Acosta MRN: [id number] Date of Birth: March 02, 1926   Medicare Observation Status Notification Given:   Yes    Jerral Mccauley, LCSW 09/17/2020, 3:26 PM

## 2020-09-17 NOTE — Progress Notes (Signed)
PROGRESS NOTE    David Acosta  ZOX:[id number] DOB: 06-12-1926 DOA: 09/16/2020 PCP: Christain Sacramento, MD   Brief Narrative: 87 year old with past medical history significant for chronic systolic heart failure ejection fraction 45% echo 07/2020, frequent PVC, chronic A. fib on Cardizem and Eliquis, hypothyroidism who presents with increasing shortness of breath and lower extremity edema.   Patient symptoms started 3 weeks prior to admission and progressively getting worse, he has gained 12 pounds over the last 2 or 3 weeks.   Evaluation in the ED chest x-ray showed lung congestion, heart rate ranging from low 30s to 50s.  Blood pressure in the low side.  Mentation at baseline.  Assessment & Plan:   Active Problems:   Acute CHF (congestive heart failure) (HCC)   CHF (congestive heart failure) (HCC)   1-Bradycardia;  -Holding Diltiazem.  -improved.  -continue to hold diltiazem.   2-Acute on chronic systolic  Heart failure:  -Presents with dyspnea, BNP 2000, chest x ray: L pleural effusion, venous congestion.  -ECHO; 07/2020 EF 45 %.  -I ordered 40 mg IV lasix.  -monitor out put.   3-Elevated D dimer;  Check doppler.   4-Hyponatremia;  Might be related to heart failure, hypervolemia.  Received a dose of IV lasix.  Plan to repeat labs in am.   5-Acute on CKD stage IIIb; Cr baseline 1.5. Presents with cr of 1.8 Hypoperfusion form HF or bradycardia.  Repeat labs in am.   6-Hypothyroidism;  -Continue with Synthroid.  7-History of A fib:  -Continue Eliquis, holding diltiazem due to bradycardia.    Estimated body mass index is 26.14 kg/m as calculated from the following:   Height as of this encounter: 5\' 9"  (1.753 m).   Weight as of this encounter: 80.3 kg.   DVT prophylaxis: Eliquis Code Status: Partial Code: Yes to everything except chest compression.  Family Communication: will update son.  Disposition Plan:  Status is: Inpatient  Remains inpatient appropriate  because:IV treatments appropriate due to intensity of illness or inability to take PO   Dispo: The patient is from: Home              Anticipated d/c is to: Home              Anticipated d/c date is: 2 days              Patient currently is not medically stable to d/c.   Difficult to place patient No        Consultants:   Cardiology   Procedures:   Doppler.   Antimicrobials:    Subjective: He is feeling ok, he couldn't sleep last night.  He denies chest pain.    Objective: Vitals:   09/17/20 0615 09/17/20 0630 09/17/20 0645 09/17/20 0700  BP: (!) 113/58 114/60 119/61 115/62  Pulse: 63 68 62 63  Resp: 15 16 17 18   Temp:      TempSrc:      SpO2: 93% 93% 93% 93%  Weight:      Height:       No intake or output data in the 24 hours ending 09/17/20 0739 Filed Weights   09/16/20 1159  Weight: 80.3 kg    Examination:  General exam: Appears calm and comfortable  Respiratory system: B/L crackles.  Cardiovascular system: S1 & S2 heard, RRR. No JVD, murmurs, rubs, gallops or clicks. No pedal edema. Gastrointestinal system: Abdomen is nondistended, soft and nontender. No organomegaly or masses felt. Normal bowel sounds  heard. Central nervous system: Alert and oriented.  Extremities: Symmetric 5 x 5 power.   Data Reviewed: I have personally reviewed following labs and imaging studies  CBC: Recent Labs  Lab 09/16/20 1309  WBC 8.6  NEUTROABS 6.7  HGB 10.4*  HCT 32.7*  MCV 91.1  PLT 811   Basic Metabolic Panel: Recent Labs  Lab 09/16/20 1309 09/17/20 0217  NA 127* 128*  K 5.1 5.0  CL 88* 92*  CO2 24 24  GLUCOSE 107* 103*  BUN 33* 33*  CREATININE 1.86* 1.82*  CALCIUM 9.1 8.7*  MG 2.4  --    GFR: Estimated Creatinine Clearance: 25.9 mL/min (A) (by C-G formula based on SCr of 1.82 mg/dL (H)). Liver Function Tests: Recent Labs  Lab 09/16/20 1309  AST 21  ALT 16  ALKPHOS 77  BILITOT 0.8  PROT 7.2  ALBUMIN 3.3*   No results for input(s):  LIPASE, AMYLASE in the last 168 hours. No results for input(s): AMMONIA in the last 168 hours. Coagulation Profile: No results for input(s): INR, PROTIME in the last 168 hours. Cardiac Enzymes: No results for input(s): CKTOTAL, CKMB, CKMBINDEX, TROPONINI in the last 168 hours. BNP (last 3 results) No results for input(s): PROBNP in the last 8760 hours. HbA1C: No results for input(s): HGBA1C in the last 72 hours. CBG: No results for input(s): GLUCAP in the last 168 hours. Lipid Profile: Recent Labs    09/16/20 1309  TRIG 87   Thyroid Function Tests: No results for input(s): TSH, T4TOTAL, FREET4, T3FREE, THYROIDAB in the last 72 hours. Anemia Panel: Recent Labs    09/16/20 1309  FERRITIN 26   Sepsis Labs: Recent Labs  Lab 09/16/20 1306 09/16/20 1309  PROCALCITON  --  0.18  LATICACIDVEN 1.9  --     Recent Results (from the past 240 hour(s))  Resp Panel by RT-PCR (Flu A&B, Covid) Nasopharyngeal Swab     Status: None   Collection Time: 09/16/20 12:51 PM   Specimen: Nasopharyngeal Swab; Nasopharyngeal(NP) swabs in vial transport medium  Result Value Ref Range Status   SARS Coronavirus 2 by RT PCR NEGATIVE NEGATIVE Final    Comment: (NOTE) SARS-CoV-2 target nucleic acids are NOT DETECTED.  The SARS-CoV-2 RNA is generally detectable in upper respiratory specimens during the acute phase of infection. The lowest concentration of SARS-CoV-2 viral copies this assay can detect is 138 copies/mL. A negative result does not preclude SARS-Cov-2 infection and should not be used as the sole basis for treatment or other patient management decisions. A negative result may occur with  improper specimen collection/handling, submission of specimen other than nasopharyngeal swab, presence of viral mutation(s) within the areas targeted by this assay, and inadequate number of viral copies(<138 copies/mL). A negative result must be combined with clinical observations, patient history, and  epidemiological information. The expected result is Negative.  Fact Sheet for Patients:  EntrepreneurPulse.com.au  Fact Sheet for Healthcare Providers:  IncredibleEmployment.be  This test is no t yet approved or cleared by the [place] FDA and  has been authorized for detection and/or diagnosis of SARS-CoV-2 by FDA under an Emergency Use Authorization (EUA). This EUA will remain  in effect (meaning this test can be used) for the duration of the COVID-19 declaration under Section 564(b)(1) of the Act, 21 U.S.C.section 360bbb-3(b)(1), unless the authorization is terminated  or revoked sooner.       Influenza A by PCR NEGATIVE NEGATIVE Final   Influenza B by PCR NEGATIVE NEGATIVE Final  Comment: (NOTE) The Xpert Xpress SARS-CoV-2/FLU/RSV plus assay is intended as an aid in the diagnosis of influenza from Nasopharyngeal swab specimens and should not be used as a sole basis for treatment. Nasal washings and aspirates are unacceptable for Xpert Xpress SARS-CoV-2/FLU/RSV testing.  Fact Sheet for Patients: EntrepreneurPulse.com.au  Fact Sheet for Healthcare Providers: IncredibleEmployment.be  This test is not yet approved or cleared by the [place] FDA and has been authorized for detection and/or diagnosis of SARS-CoV-2 by FDA under an Emergency Use Authorization (EUA). This EUA will remain in effect (meaning this test can be used) for the duration of the COVID-19 declaration under Section 564(b)(1) of the Act, 21 U.S.C. section 360bbb-3(b)(1), unless the authorization is terminated or revoked.  Performed at [facility], [place] [address], [place], [place] [address]          Radiology Studies: DG Chest [place] 1 View  Result Date: 09/17/2020 CLINICAL DATA:  87 year old male with bradycardia, shortness of breath status post COVID-19 three weeks ago. Atrial fibrillation. EXAM: PORTABLE CHEST  1 VIEW COMPARISON:  Portable chest 09/16/2020 and earlier. FINDINGS: Portable AP semi upright view at 0703 hours. Improved lung volumes. Mild cardiomegaly is stable. Other mediastinal contours are within normal limits. Visualized tracheal air column is within normal limits. Veiling bilateral lower lung opacity, dense retrocardiac opacity. But bilateral pulmonary vascularity appears decreased, more normal. No pneumothorax. No air bronchograms. No acute osseous abnormality identified. IMPRESSION: 1. Small to moderate bilateral pleural effusions with lower lobe collapse or consolidation. 2. Normalized pulmonary vascularity since yesterday. Electronically Signed   By: Genevie Ann M.D.   On: 09/17/2020 07:12   DG Chest Portable 1 View  Result Date: 09/16/2020 CLINICAL DATA:  Short of breath, bradycardia EXAM: PORTABLE CHEST 1 VIEW COMPARISON:  03/24/2019 FINDINGS: Stable large cardiac silhouette. Bilateral small pleural effusions. Mild central venous congestion. No focal infiltrate. No overt pulmonary edema. No pneumothorax. IMPRESSION: Small bilateral pleural effusions and central venous congestion. Electronically Signed   By: Suzy Bouchard M.D.   On: 09/16/2020 12:24        Scheduled Meds: . apixaban  2.5 mg Oral BID  . ferrous sulfate  325 mg Oral Q breakfast  . gabapentin  100 mg Oral QHS  . latanoprost  1 drop Both Eyes QHS  . levothyroxine  25 mcg Oral Daily  . polyethylene glycol  17 g Oral Daily  . sodium chloride flush  3 mL Intravenous Q12H   Continuous Infusions: . sodium chloride       LOS: 1 day    Time spent: 35 minutes.     Elmarie Shiley, MD [facility]   If 7PM-7AM, please contact night-coverage [email or web address]  09/17/2020, 7:39 AM

## 2020-09-17 NOTE — ED Notes (Signed)
Cardiac [place] being done at bedside

## 2020-09-17 NOTE — Care Management CC44 (Signed)
Condition Code 44 Documentation Completed  Patient Details  Name: David Acosta MRN: [id number] Date of Birth: Dec 19, 1925   Condition Code 44 given:  Yes Patient signature on Condition Code 44 notice:  Yes Documentation of 2 MD's agreement:  Yes Code 44 added to claim:  Yes    Derick Seminara, LCSW 09/17/2020, 3:28 PM

## 2020-09-17 NOTE — CV Procedure (Signed)
BLE venous duplex completed.  Results can be found under chart review under CV PROC. 09/17/2020 11:18 AM Anjannette Gauger RVT, RDMS

## 2020-09-17 NOTE — ED Notes (Signed)
  Patient adjusted in bed.  States he is very uncomfortable and has not been able to sleep all night.

## 2020-09-17 NOTE — Progress Notes (Signed)
Progress Note  Patient Name: ODAY RIDINGS Date of Encounter: 09/17/2020  [facility] Cardiologist: No primary care provider on file. [name]  Subjective   Feels fatigued  Inpatient Medications    Scheduled Meds: . apixaban  2.5 mg Oral BID  . ferrous sulfate  325 mg Oral Q breakfast  . gabapentin  100 mg Oral QHS  . latanoprost  1 drop Both Eyes QHS  . levothyroxine  25 mcg Oral Daily  . polyethylene glycol  17 g Oral Daily  . sodium chloride flush  3 mL Intravenous Q12H   Continuous Infusions: . sodium chloride     PRN Meds: sodium chloride, acetaminophen, albuterol, ALPRAZolam, ondansetron (ZOFRAN) IV, sodium chloride flush   Vital Signs    Vitals:   09/17/20 0645 09/17/20 0700 09/17/20 0800 09/17/20 0900  BP: 119/61 115/62 116/60 (!) 108/57  Pulse: 62 63 65 65  Resp: 17 18 13 14   Temp:      TempSrc:      SpO2: 93% 93% 94% 96%  Weight:      Height:        Intake/Output Summary (Last 24 hours) at 09/17/2020 1128 Last data filed at 09/17/2020 1052 Gross per 24 hour  Intake --  Output 275 ml  Net -275 ml   Last 3 Weights 09/16/2020 07/27/2020 01/21/2020  Weight (lbs) 177 lb 171 lb 179 lb 12.8 oz  Weight (kg) 80.287 kg 77.565 kg 81.557 kg      Telemetry    AFFib, rate controlled- Personally Reviewed  ECG    None today- Personally Reviewed  Physical Exam   GEN: No acute distress.   Neck: No JVD Cardiac: irregularly irregular, no murmurs, rubs, or gallops.  Respiratory: Clear to auscultation bilaterally. GI: Soft, nontender, non-distended  MS: bilateral ankle  edema; No deformity. Neuro:  Nonfocal  Psych: Normal affect   Labs    High Sensitivity Troponin:  No results for input(s): TROPONINIHS in the last 720 hours.    Chemistry Recent Labs  Lab 09/16/20 1309 09/17/20 0217  NA 127* 128*  K 5.1 5.0  CL 88* 92*  CO2 24 24  GLUCOSE 107* 103*  BUN 33* 33*  CREATININE 1.86* 1.82*  CALCIUM 9.1 8.7*  PROT 7.2  --   ALBUMIN 3.3*  --    AST 21  --   ALT 16  --   ALKPHOS 77  --   BILITOT 0.8  --   GFRNONAA 34* 34*  ANIONGAP 15 12     Hematology Recent Labs  Lab 09/16/20 1309  WBC 8.6  RBC 3.59*  HGB 10.4*  HCT 32.7*  MCV 91.1  MCH 29.0  MCHC 31.8  RDW 14.8  PLT 388    BNP Recent Labs  Lab 09/16/20 1308  BNP 2,004.7*     DDimer  Recent Labs  Lab 09/16/20 1309  DDIMER 2.65*     Radiology    DG Chest Port 1 View  Result Date: 09/17/2020 CLINICAL DATA:  87 year old male with bradycardia, shortness of breath status post COVID-19 three weeks ago. Atrial fibrillation. EXAM: PORTABLE CHEST 1 VIEW COMPARISON:  Portable chest 09/16/2020 and earlier. FINDINGS: Portable AP semi upright view at 0703 hours. Improved lung volumes. Mild cardiomegaly is stable. Other mediastinal contours are within normal limits. Visualized tracheal air column is within normal limits. Veiling bilateral lower lung opacity, dense retrocardiac opacity. But bilateral pulmonary vascularity appears decreased, more normal. No pneumothorax. No air bronchograms. No acute osseous abnormality identified. IMPRESSION:  1. Small to moderate bilateral pleural effusions with lower lobe collapse or consolidation. 2. Normalized pulmonary vascularity since yesterday. Electronically Signed   By: Genevie Ann M.D.   On: 09/17/2020 07:12   DG Chest Portable 1 View  Result Date: 09/16/2020 CLINICAL DATA:  Short of breath, bradycardia EXAM: PORTABLE CHEST 1 VIEW COMPARISON:  03/24/2019 FINDINGS: Stable large cardiac silhouette. Bilateral small pleural effusions. Mild central venous congestion. No focal infiltrate. No overt pulmonary edema. No pneumothorax. IMPRESSION: Small bilateral pleural effusions and central venous congestion. Electronically Signed   By: Suzy Bouchard M.D.   On: 09/16/2020 12:24   VAS [place] LOWER EXTREMITY VENOUS (DVT)  Result Date: 09/17/2020  Lower Venous DVT Study Indications: Edema.  Comparison Study: No previous exams. Performing  Technologist: Rogelia Rohrer  Examination Guidelines: A complete evaluation includes B-mode imaging, spectral Doppler, color Doppler, and power Doppler as needed of all accessible portions of each vessel. Bilateral testing is considered an integral part of a complete examination. Limited examinations for reoccurring indications may be performed as noted. The reflux portion of the exam is performed with the patient in reverse Trendelenburg.  +---------+---------------+---------+-----------+----------+--------------+ RIGHT    CompressibilityPhasicitySpontaneityPropertiesThrombus Aging +---------+---------------+---------+-----------+----------+--------------+ CFV      Full           Yes      Yes                                 +---------+---------------+---------+-----------+----------+--------------+ SFJ      Full                                                        +---------+---------------+---------+-----------+----------+--------------+ FV Prox  Full           Yes      Yes                                 +---------+---------------+---------+-----------+----------+--------------+ FV Mid   Full           Yes      Yes                                 +---------+---------------+---------+-----------+----------+--------------+ FV DistalFull           Yes      Yes                                 +---------+---------------+---------+-----------+----------+--------------+ PFV      Full                                                        +---------+---------------+---------+-----------+----------+--------------+ POP      Full           Yes      Yes                                 +---------+---------------+---------+-----------+----------+--------------+  PTV      Full                                                        +---------+---------------+---------+-----------+----------+--------------+ PERO     Full                                                         +---------+---------------+---------+-----------+----------+--------------+   +---------+---------------+---------+-----------+----------+--------------+ LEFT     CompressibilityPhasicitySpontaneityPropertiesThrombus Aging +---------+---------------+---------+-----------+----------+--------------+ CFV      Full           Yes      Yes                                 +---------+---------------+---------+-----------+----------+--------------+ SFJ      Full                                                        +---------+---------------+---------+-----------+----------+--------------+ FV Prox  Full           Yes      Yes                                 +---------+---------------+---------+-----------+----------+--------------+ FV Mid   Full           Yes      Yes                                 +---------+---------------+---------+-----------+----------+--------------+ FV DistalFull           Yes      Yes                                 +---------+---------------+---------+-----------+----------+--------------+ PFV      Full                                                        +---------+---------------+---------+-----------+----------+--------------+ POP      Full           Yes      Yes                                 +---------+---------------+---------+-----------+----------+--------------+ PTV      Full                                                        +---------+---------------+---------+-----------+----------+--------------+  PERO     Full                                                        +---------+---------------+---------+-----------+----------+--------------+     Summary: BILATERAL: - No evidence of deep vein thrombosis seen in the lower extremities, bilaterally. - No evidence of superficial venous thrombosis in the lower extremities, bilaterally. -No evidence of popliteal cyst, bilaterally.   *See table(s) above for  measurements and observations.    Preliminary     Cardiac Studies   telemetry  Patient Profile     87 y.o. male with AFib and volume overload  Assessment & Plan    1) AFib/bradycardia: HR better this morning.  BP better this AM.  No indication for pacer.  I personally reviewed tele and there were no significant pauses. Continue to AVOID diltiazem.  Will plan for outpatient heart monitor to see if he has any high heart rates.  2) Chronic combined diastolic and systolic heart failure: Received a dose of IV lasix this AM.  Has torsemide to use as an outpatient.  Volume overload noted on chest xray.   EF 45%.  3) Patient is pushing to go home.  I suspect his overall fatigue and weakness was a combination of recent COVID, pneumonia, age and slow HR.  I discussed everything with his son and they will await to hear from the hospitalist regarding possibility of going home or if she feels another day in the hospital may help.         For questions or updates, please contact [place] Please consult [email or web address] for contact info under        Signed, Larae Grooms, MD  09/17/2020, 11:28 AM

## 2020-09-17 NOTE — Progress Notes (Signed)
Patient seen on follow-up rounds while holding in ED for a progressive bed. No acute issues, discussed with RN.

## 2020-09-18 DIAGNOSIS — I509 Heart failure, unspecified: Secondary | ICD-10-CM | POA: Diagnosis not present

## 2020-09-18 DIAGNOSIS — I4821 Permanent atrial fibrillation: Secondary | ICD-10-CM | POA: Diagnosis not present

## 2020-09-18 DIAGNOSIS — I5041 Acute combined systolic (congestive) and diastolic (congestive) heart failure: Secondary | ICD-10-CM | POA: Diagnosis not present

## 2020-09-18 DIAGNOSIS — R600 Localized edema: Secondary | ICD-10-CM | POA: Diagnosis not present

## 2020-09-18 LAB — BASIC METABOLIC PANEL
Anion gap: 10 (ref 5–15)
BUN: 30 mg/dL — ABNORMAL HIGH (ref 8–23)
CO2: 28 mmol/L (ref 22–32)
Calcium: 8.8 mg/dL — ABNORMAL LOW (ref 8.9–10.3)
Chloride: 92 mmol/L — ABNORMAL LOW (ref 98–111)
Creatinine, Ser: 1.68 mg/dL — ABNORMAL HIGH (ref 0.61–1.24)
GFR, Estimated: 38 mL/min — ABNORMAL LOW (ref >60)
Glucose, Bld: 98 mg/dL (ref 70–99)
Potassium: 4.7 mmol/L (ref 3.5–5.1)
Sodium: 130 mmol/L — ABNORMAL LOW (ref 135–145)

## 2020-09-18 LAB — COMPREHENSIVE METABOLIC PANEL
ALT: 52 U/L — ABNORMAL HIGH (ref 0–44)
AST: 71 U/L — ABNORMAL HIGH (ref 15–41)
Albumin: 2.9 g/dL — ABNORMAL LOW (ref 3.5–5.0)
Alkaline Phosphatase: 101 U/L (ref 38–126)
Anion gap: 11 (ref 5–15)
BUN: 33 mg/dL — ABNORMAL HIGH (ref 8–23)
CO2: 27 mmol/L (ref 22–32)
Calcium: 8.3 mg/dL — ABNORMAL LOW (ref 8.9–10.3)
Chloride: 91 mmol/L — ABNORMAL LOW (ref 98–111)
Creatinine, Ser: 1.92 mg/dL — ABNORMAL HIGH (ref 0.61–1.24)
GFR, Estimated: 32 mL/min — ABNORMAL LOW (ref >60)
Glucose, Bld: 126 mg/dL — ABNORMAL HIGH (ref 70–99)
Potassium: 4.9 mmol/L (ref 3.5–5.1)
Sodium: 129 mmol/L — ABNORMAL LOW (ref 135–145)
Total Bilirubin: 0.9 mg/dL (ref 0.3–1.2)
Total Protein: 6.5 g/dL (ref 6.5–8.1)

## 2020-09-18 LAB — CBC
HCT: 29.4 % — ABNORMAL LOW (ref 39.0–52.0)
HCT: 31 % — ABNORMAL LOW (ref 39.0–52.0)
Hemoglobin: 9.6 g/dL — ABNORMAL LOW (ref 13.0–17.0)
Hemoglobin: 9.8 g/dL — ABNORMAL LOW (ref 13.0–17.0)
MCH: 30.2 pg (ref 26.0–34.0)
MCH: 29.8 pg (ref 26.0–34.0)
MCHC: 32.7 g/dL (ref 30.0–36.0)
MCHC: 31.6 g/dL (ref 30.0–36.0)
MCV: 92.5 fL (ref 80.0–100.0)
MCV: 94.2 fL (ref 80.0–100.0)
Platelets: 318 10*3/uL (ref 150–400)
Platelets: 311 10*3/uL (ref 150–400)
RBC: 3.18 MIL/uL — ABNORMAL LOW (ref 4.22–5.81)
RBC: 3.29 MIL/uL — ABNORMAL LOW (ref 4.22–5.81)
RDW: 15.2 % (ref 11.5–15.5)
RDW: 15.3 % (ref 11.5–15.5)
WBC: 9.6 10*3/uL (ref 4.0–10.5)
WBC: 7.7 10*3/uL (ref 4.0–10.5)
nRBC: 0.3 % — ABNORMAL HIGH (ref 0.0–0.2)
nRBC: 0.7 % — ABNORMAL HIGH (ref 0.0–0.2)

## 2020-09-18 LAB — GLUCOSE, CAPILLARY
Glucose-Capillary: 142 mg/dL — ABNORMAL HIGH (ref 70–99)
Glucose-Capillary: 189 mg/dL — ABNORMAL HIGH (ref 70–99)
Glucose-Capillary: 45 mg/dL — ABNORMAL LOW (ref 70–99)
Glucose-Capillary: 72 mg/dL (ref 70–99)

## 2020-09-18 LAB — PHOSPHORUS: Phosphorus: 4 mg/dL (ref 2.5–4.6)

## 2020-09-18 LAB — MAGNESIUM: Magnesium: 2.4 mg/dL (ref 1.7–2.4)

## 2020-09-18 MED ORDER — TORSEMIDE 20 MG PO TABS
40.0000 mg | ORAL_TABLET | Freq: Two times a day (BID) | ORAL | Status: DC
  Administered 2020-09-18 (×2): 40 mg via ORAL
  Filled 2020-09-18 (×3): qty 2

## 2020-09-18 MED ORDER — THIAMINE HCL 100 MG PO TABS
100.0000 mg | ORAL_TABLET | Freq: Every day | ORAL | Status: DC
  Administered 2020-09-18: 100 mg via ORAL
  Filled 2020-09-18 (×2): qty 1

## 2020-09-18 MED ORDER — GLUCOSE 40 % PO GEL
ORAL | Status: AC
  Filled 2020-09-18: qty 1

## 2020-09-18 MED ORDER — LORAZEPAM 2 MG/ML IJ SOLN: 1.0000 mg | INTRAMUSCULAR | Status: DC | PRN

## 2020-09-18 MED ORDER — LORAZEPAM 1 MG PO TABS
1.0000 mg | ORAL_TABLET | ORAL | Status: DC | PRN
  Administered 2020-09-18: 2 mg via ORAL
  Filled 2020-09-18: qty 2

## 2020-09-18 MED ORDER — DEXTROSE 50 % IV SOLN
INTRAVENOUS | Status: AC
  Administered 2020-09-18: 50 mL
  Filled 2020-09-18: qty 50

## 2020-09-18 MED ORDER — ADULT MULTIVITAMIN W/MINERALS CH
1.0000 | ORAL_TABLET | Freq: Every day | ORAL | Status: DC
  Administered 2020-09-18: 1 via ORAL
  Filled 2020-09-18 (×2): qty 1

## 2020-09-18 MED ORDER — GLUCOSE 40 % PO GEL: 2.0000 | ORAL | Status: AC

## 2020-09-18 MED ORDER — FOLIC ACID 1 MG PO TABS
1.0000 mg | ORAL_TABLET | Freq: Every day | ORAL | Status: DC
  Administered 2020-09-18: 1 mg via ORAL
  Filled 2020-09-18 (×2): qty 1

## 2020-09-18 NOTE — Progress Notes (Addendum)
Progress Note  Patient Name: David Acosta Date of Encounter: 09/18/2020  [facility] Cardiologist: No primary care provider on file. David Acosta  Subjective   He feels confused this morning.   Inpatient Medications    Scheduled Meds: . apixaban  2.5 mg Oral BID  . ferrous sulfate  325 mg Oral Q breakfast  . gabapentin  100 mg Oral QHS  . latanoprost  1 drop Both Eyes QHS  . levothyroxine  25 mcg Oral Daily  . polyethylene glycol  17 g Oral Daily  . sodium chloride flush  3 mL Intravenous Q12H  . torsemide  40 mg Oral BID   Continuous Infusions: . sodium chloride     PRN Meds: sodium chloride, acetaminophen, albuterol, ALPRAZolam, ondansetron (ZOFRAN) IV, sodium chloride flush   Vital Signs    Vitals:   09/17/20 2006 09/18/20 0019 09/18/20 0431 09/18/20 0717  BP: (!) 107/54 109/72 (!) 90/59 105/60  Pulse: 69 67 71 86  Resp: 18 18 18 20   Temp: 98.4 F (36.9 C) 97.6 F (36.4 C) 98.6 F (37 C) 98.6 F (37 C)  TempSrc: Oral Oral Oral Oral  SpO2: 93% 94% 90% 97%  Weight:   82.7 kg   Height:        Intake/Output Summary (Last 24 hours) at 09/18/2020 0923 Last data filed at 09/18/2020 0600 Gross per 24 hour  Intake 60 ml  Output 625 ml  Net -565 ml   Last 3 Weights 09/18/2020 09/17/2020 09/16/2020  Weight (lbs) 182 lb 5.1 oz 177 lb 7.5 oz 177 lb  Weight (kg) 82.7 kg 80.5 kg 80.287 kg      Telemetry    AFib, rate controlled - Personally Reviewed  ECG    AFib- Personally Reviewed  Physical Exam   GEN: No acute distress.   Neck: No JVD Cardiac: irregularly irregular, no murmurs, rubs, or gallops.  Respiratory: Clear to auscultation bilaterally. GI: Soft, nontender, non-distended  MS: Decreased LE edema; No deformity. Neuro:  Nonfocal  Psych: confused, but aware that he is confused   Labs    High Sensitivity Troponin:  No results for input(s): TROPONINIHS in the last 720 hours.    Chemistry Recent Labs  Lab 09/16/20 1309 09/17/20 0217  09/18/20 0127  NA 127* 128* 130*  K 5.1 5.0 4.7  CL 88* 92* 92*  CO2 24 24 28   GLUCOSE 107* 103* 98  BUN 33* 33* 30*  CREATININE 1.86* 1.82* 1.68*  CALCIUM 9.1 8.7* 8.8*  PROT 7.2  --   --   ALBUMIN 3.3*  --   --   AST 21  --   --   ALT 16  --   --   ALKPHOS 77  --   --   BILITOT 0.8  --   --   GFRNONAA 34* 34* 38*  ANIONGAP 15 12 10      Hematology Recent Labs  Lab 09/16/20 1309 09/18/20 0127  WBC 8.6 9.6  RBC 3.59* 3.18*  HGB 10.4* 9.6*  HCT 32.7* 29.4*  MCV 91.1 92.5  MCH 29.0 30.2  MCHC 31.8 32.7  RDW 14.8 15.2  PLT 388 318    BNP Recent Labs  Lab 09/16/20 1308  BNP 2,004.7*     DDimer  Recent Labs  Lab 09/16/20 1309  DDIMER 2.65*     Radiology    DG Chest Port 1 View  Result Date: 09/17/2020 CLINICAL DATA:  87 year old male with bradycardia, shortness of breath status post COVID-19 three  weeks ago. Atrial fibrillation. EXAM: PORTABLE CHEST 1 VIEW COMPARISON:  Portable chest 09/16/2020 and earlier. FINDINGS: Portable AP semi upright view at 0703 hours. Improved lung volumes. Mild cardiomegaly is stable. Other mediastinal contours are within normal limits. Visualized tracheal air column is within normal limits. Veiling bilateral lower lung opacity, dense retrocardiac opacity. But bilateral pulmonary vascularity appears decreased, more normal. No pneumothorax. No air bronchograms. No acute osseous abnormality identified. IMPRESSION: 1. Small to moderate bilateral pleural effusions with lower lobe collapse or consolidation. 2. Normalized pulmonary vascularity since yesterday. Electronically Signed   By: Genevie Ann M.D.   On: 09/17/2020 07:12   DG Chest Portable 1 View  Result Date: 09/16/2020 CLINICAL DATA:  Short of breath, bradycardia EXAM: PORTABLE CHEST 1 VIEW COMPARISON:  03/24/2019 FINDINGS: Stable large cardiac silhouette. Bilateral small pleural effusions. Mild central venous congestion. No focal infiltrate. No overt pulmonary edema. No pneumothorax.  IMPRESSION: Small bilateral pleural effusions and central venous congestion. Electronically Signed   By: Suzy Bouchard M.D.   On: 09/16/2020 12:24   VAS [place] LOWER EXTREMITY VENOUS (DVT)  Result Date: 09/17/2020  Lower Venous DVT Study Indications: Edema.  Comparison Study: No previous exams. Performing Technologist: Rogelia Rohrer  Examination Guidelines: A complete evaluation includes B-mode imaging, spectral Doppler, color Doppler, and power Doppler as needed of all accessible portions of each vessel. Bilateral testing is considered an integral part of a complete examination. Limited examinations for reoccurring indications may be performed as noted. The reflux portion of the exam is performed with the patient in reverse Trendelenburg.  +---------+---------------+---------+-----------+----------+--------------+ RIGHT    CompressibilityPhasicitySpontaneityPropertiesThrombus Aging +---------+---------------+---------+-----------+----------+--------------+ CFV      Full           Yes      Yes                                 +---------+---------------+---------+-----------+----------+--------------+ SFJ      Full                                                        +---------+---------------+---------+-----------+----------+--------------+ FV Prox  Full           Yes      Yes                                 +---------+---------------+---------+-----------+----------+--------------+ FV Mid   Full           Yes      Yes                                 +---------+---------------+---------+-----------+----------+--------------+ FV DistalFull           Yes      Yes                                 +---------+---------------+---------+-----------+----------+--------------+ PFV      Full                                                        +---------+---------------+---------+-----------+----------+--------------+  POP      Full           Yes      Yes                                  +---------+---------------+---------+-----------+----------+--------------+ PTV      Full                                                        +---------+---------------+---------+-----------+----------+--------------+ PERO     Full                                                        +---------+---------------+---------+-----------+----------+--------------+   +---------+---------------+---------+-----------+----------+--------------+ LEFT     CompressibilityPhasicitySpontaneityPropertiesThrombus Aging +---------+---------------+---------+-----------+----------+--------------+ CFV      Full           Yes      Yes                                 +---------+---------------+---------+-----------+----------+--------------+ SFJ      Full                                                        +---------+---------------+---------+-----------+----------+--------------+ FV Prox  Full           Yes      Yes                                 +---------+---------------+---------+-----------+----------+--------------+ FV Mid   Full           Yes      Yes                                 +---------+---------------+---------+-----------+----------+--------------+ FV DistalFull           Yes      Yes                                 +---------+---------------+---------+-----------+----------+--------------+ PFV      Full                                                        +---------+---------------+---------+-----------+----------+--------------+ POP      Full           Yes      Yes                                 +---------+---------------+---------+-----------+----------+--------------+ PTV  Full                                                        +---------+---------------+---------+-----------+----------+--------------+ PERO     Full                                                         +---------+---------------+---------+-----------+----------+--------------+     Summary: BILATERAL: - No evidence of deep vein thrombosis seen in the lower extremities, bilaterally. - No evidence of superficial venous thrombosis in the lower extremities, bilaterally. -No evidence of popliteal cyst, bilaterally.   *See table(s) above for measurements and observations. Electronically signed by Servando Snare MD on 09/17/2020 at 1:04:38 PM.    Final     Cardiac Studies     Patient Profile     87 y.o. male with AFib, bradycardia  Assessment & Plan    1) AFib: rate controlled off of diltiazem.  Continue to hold diltiazem.  He will wear a monitor at home to check his HR. follow-up with Dr. Johnsie Cancel based on that.  Low-dose Eliquis for stroke prevention.  He is anemic.  THis will require f/u as an outpatient.  I spoke to his son who told me that there is already an appointment scheduled next week for his anemia.  No indication for pacemaker at this time.  Acute on chronic combined CHF: Improved with diuresis.  Confusion: I suspect this will improve when he is back at home.  Hyponatremia: Improved overnight.  CKD: Creatinine has improved.  Likely with better perfusion from higher heart rate.  I discussed the plan with his son today.     For questions or updates, please contact [place] Please consult [email or web address] for contact info under        Signed, Larae Grooms, MD  09/18/2020, 9:23 AM

## 2020-09-18 NOTE — Progress Notes (Addendum)
PROGRESS NOTE    David Acosta  QIW:[id number] DOB: 09/23/1925 DOA: 09/16/2020 PCP: David Sacramento, MD   Brief Narrative: 87 year old with past medical history significant for chronic systolic heart failure ejection fraction 45% echo 07/2020, frequent PVC, chronic A. fib on Cardizem and Eliquis, hypothyroidism who presents with increasing shortness of breath and lower extremity edema.   Patient symptoms started 3 weeks prior to admission and progressively getting worse, he has gained 12 pounds over the last 2 or 3 weeks.   Evaluation in the ED chest x-ray showed lung congestion, heart rate ranging from low 30s to 50s.  Blood pressure in the low side.  Mentation at baseline.  Patient develop Acute metabolic encephalopathy, he became confuse, lethargic.   Assessment & Plan:   Active Problems:   Acute CHF (congestive heart failure) (HCC)   CHF (congestive heart failure) (HCC)   1-Bradycardia;  -Holding Diltiazem.  -Improved. HR 70-80 -Continue to hold diltiazem.  -Cardiology planning heart monitor.   2-Acute on chronic systolic  Heart failure:  -Presents with dyspnea, BNP 2000, chest x ray: L pleural effusion, venous congestion.  -ECHO; 07/2020 EF 45 %.  -received  40 mg IV lasix 2/10. -Resume torsemide.   3-Elevated D dimer;  Doppler negative for DVT  4-Hyponatremia;  Might be related to heart failure, hypervolemia.  Received a dose of IV lasix.  Improved, sodium 130  5-Acute on CKD stage IIIb; Cr baseline 1.5. Presents with cr of 1.8 Hypoperfusion form HF or bradycardia.  Cr improved down to 1.6  6-Hypothyroidism;  -Continue with Synthroid.  7-History of A fib:  -Continue Eliquis, holding diltiazem due to bradycardia.  8-Acute metabolic encephalopathy, delirium; Related to medications. He received a dose of gabapentin. Per daughter he gets confuse with gabapentin. I have discontinue it. Also he received a dose of xanax last night.  His CBG was low at 45.  Improved after amp D 50.  He is at baseline alert and oriented. He is confuse today.  My concern with his AMS is ability of family to care for him at home tonight. . Plan to monitor overnight in the hospital.  -Also history of alcohol intake. Plan to order CIWA protocol.   Estimated body mass index is 26.92 kg/m as calculated from the following:   Height as of this encounter: 5\' 9"  (1.753 m).   Weight as of this encounter: 82.7 kg.   DVT prophylaxis: Eliquis Code Status: Partial Code: Yes to everything except chest compression.  Family Communication: Daughter updated at bedside.  Disposition Plan:  Status is: Inpatient  Remains inpatient appropriate because:IV treatments appropriate due to intensity of illness or inability to take PO   Dispo: The patient is from: Home              Anticipated d/c is to: Home              Anticipated d/c date is: 2 days              Patient currently is not medically stable to d/c.   Difficult to place patient No        Consultants:   Cardiology   Procedures:   Doppler.   Antimicrobials:    Subjective: He was sleepy this am, wake up answer questions. He was confuse.  Coughing phlegm.     Objective: Vitals:   09/18/20 0431 09/18/20 0717 09/18/20 1120 09/18/20 1341  BP: (!) 90/59 105/60 98/64 110/63  Pulse: 71 86 79  Resp: 18 20 20 20   Temp: 98.6 F (37 C) 98.6 F (37 C) 98.3 F (36.8 C) 97.8 F (36.6 C)  TempSrc: Oral Oral Oral Axillary  SpO2: 90% 97% 92% 93%  Weight: 82.7 kg     Height:        Intake/Output Summary (Last 24 hours) at 09/18/2020 1353 Last data filed at 09/18/2020 0600 Gross per 24 hour  Intake 60 ml  Output 500 ml  Net -440 ml   Filed Weights   09/16/20 1159 09/17/20 1733 09/18/20 0431  Weight: 80.3 kg 80.5 kg 82.7 kg    Examination:  General exam: NAD, sleep.  Respiratory system: B/L crackles.  Cardiovascular system: S 1, S 2 IRR Gastrointestinal system: BS Present, soft, NT,  ND Central nervous system: Alert Extremities: Symmetric power.    Data Reviewed: I have personally reviewed following labs and imaging studies  CBC: Recent Labs  Lab 09/16/20 1309 09/18/20 0127  WBC 8.6 9.6  NEUTROABS 6.7  --   HGB 10.4* 9.6*  HCT 32.7* 29.4*  MCV 91.1 92.5  PLT 388 846   Basic Metabolic Panel: Recent Labs  Lab 09/16/20 1309 09/17/20 0217 09/18/20 0127  NA 127* 128* 130*  K 5.1 5.0 4.7  CL 88* 92* 92*  CO2 24 24 28   GLUCOSE 107* 103* 98  BUN 33* 33* 30*  CREATININE 1.86* 1.82* 1.68*  CALCIUM 9.1 8.7* 8.8*  MG 2.4  --   --    GFR: Estimated Creatinine Clearance: 28.1 mL/min (A) (by C-G formula based on SCr of 1.68 mg/dL (H)). Liver Function Tests: Recent Labs  Lab 09/16/20 1309  AST 21  ALT 16  ALKPHOS 77  BILITOT 0.8  PROT 7.2  ALBUMIN 3.3*   No results for input(s): LIPASE, AMYLASE in the last 168 hours. No results for input(s): AMMONIA in the last 168 hours. Coagulation Profile: No results for input(s): INR, PROTIME in the last 168 hours. Cardiac Enzymes: No results for input(s): CKTOTAL, CKMB, CKMBINDEX, TROPONINI in the last 168 hours. BNP (last 3 results) No results for input(s): PROBNP in the last 8760 hours. HbA1C: No results for input(s): HGBA1C in the last 72 hours. CBG: Recent Labs  Lab 09/18/20 1109 09/18/20 1136  GLUCAP 45* 189*   Lipid Profile: Recent Labs    09/16/20 1309  TRIG 87   Thyroid Function Tests: No results for input(s): TSH, T4TOTAL, FREET4, T3FREE, THYROIDAB in the last 72 hours. Anemia Panel: Recent Labs    09/16/20 1309  FERRITIN 26   Sepsis Labs: Recent Labs  Lab 09/16/20 1306 09/16/20 1309  PROCALCITON  --  0.18  LATICACIDVEN 1.9  --     Recent Results (from the past 240 hour(s))  Blood Culture (routine x 2)     Status: None (Preliminary result)   Collection Time: 09/16/20 12:38 PM   Specimen: BLOOD  Result Value Ref Range Status   Specimen Description BLOOD RIGHT ANTECUBITAL   Final   Special Requests   Final    BOTTLES DRAWN AEROBIC AND ANAEROBIC Blood Culture results may not be optimal due to an excessive volume of blood received in culture bottles   Culture   Final    NO GROWTH 2 DAYS Performed at [facility] [address] [place], [place] [address]    Report Status PENDING  Incomplete  Blood Culture (routine x 2)     Status: None (Preliminary result)   Collection Time: 09/16/20 12:44 PM   Specimen: BLOOD RIGHT  WRIST  Result Value Ref Range Status   Specimen Description BLOOD RIGHT WRIST  Final   Special Requests   Final    BOTTLES DRAWN AEROBIC AND ANAEROBIC Blood Culture adequate volume   Culture   Final    NO GROWTH 2 DAYS Performed at [facility], [address] [place], [place] [address]    Report Status PENDING  Incomplete  Resp Panel by RT-PCR (Flu A&B, Covid) Nasopharyngeal Swab     Status: None   Collection Time: 09/16/20 12:51 PM   Specimen: Nasopharyngeal Swab; Nasopharyngeal(NP) swabs in vial transport medium  Result Value Ref Range Status   SARS Coronavirus 2 by RT PCR NEGATIVE NEGATIVE Final    Comment: (NOTE) SARS-CoV-2 target nucleic acids are NOT DETECTED.  The SARS-CoV-2 RNA is generally detectable in upper respiratory specimens during the acute phase of infection. The lowest concentration of SARS-CoV-2 viral copies this assay can detect is 138 copies/mL. A negative result does not preclude SARS-Cov-2 infection and should not be used as the sole basis for treatment or other patient management decisions. A negative result may occur with  improper specimen collection/handling, submission of specimen other than nasopharyngeal swab, presence of viral mutation(s) within the areas targeted by this assay, and inadequate number of viral copies(<138 copies/mL). A negative result must be combined with clinical observations, patient history, and epidemiological information. The expected result is Negative.  Fact Sheet  for Patients:  EntrepreneurPulse.com.au  Fact Sheet for Healthcare Providers:  IncredibleEmployment.be  This test is no t yet approved or cleared by the [place] FDA and  has been authorized for detection and/or diagnosis of SARS-CoV-2 by FDA under an Emergency Use Authorization (EUA). This EUA will remain  in effect (meaning this test can be used) for the duration of the COVID-19 declaration under Section 564(b)(1) of the Act, 21 U.S.C.section 360bbb-3(b)(1), unless the authorization is terminated  or revoked sooner.       Influenza A by PCR NEGATIVE NEGATIVE Final   Influenza B by PCR NEGATIVE NEGATIVE Final    Comment: (NOTE) The Xpert Xpress SARS-CoV-2/FLU/RSV plus assay is intended as an aid in the diagnosis of influenza from Nasopharyngeal swab specimens and should not be used as a sole basis for treatment. Nasal washings and aspirates are unacceptable for Xpert Xpress SARS-CoV-2/FLU/RSV testing.  Fact Sheet for Patients: EntrepreneurPulse.com.au  Fact Sheet for Healthcare Providers: IncredibleEmployment.be  This test is not yet approved or cleared by the [place] FDA and has been authorized for detection and/or diagnosis of SARS-CoV-2 by FDA under an Emergency Use Authorization (EUA). This EUA will remain in effect (meaning this test can be used) for the duration of the COVID-19 declaration under Section 564(b)(1) of the Act, 21 U.S.C. section 360bbb-3(b)(1), unless the authorization is terminated or revoked.  Performed at [facility], [place] [address], [place], [place] [address]          Radiology Studies: DG Chest [place] 1 View  Result Date: 09/17/2020 CLINICAL DATA:  87 year old male with bradycardia, shortness of breath status post COVID-19 three weeks ago. Atrial fibrillation. EXAM: PORTABLE CHEST 1 VIEW COMPARISON:  Portable chest 09/16/2020 and earlier. FINDINGS:  Portable AP semi upright view at 0703 hours. Improved lung volumes. Mild cardiomegaly is stable. Other mediastinal contours are within normal limits. Visualized tracheal air column is within normal limits. Veiling bilateral lower lung opacity, dense retrocardiac opacity. But bilateral pulmonary vascularity appears decreased, more normal. No pneumothorax. No air bronchograms. No acute osseous abnormality  identified. IMPRESSION: 1. Small to moderate bilateral pleural effusions with lower lobe collapse or consolidation. 2. Normalized pulmonary vascularity since yesterday. Electronically Signed   By: Genevie Ann M.D.   On: 09/17/2020 07:12   VAS [place] LOWER EXTREMITY VENOUS (DVT)  Result Date: 09/17/2020  Lower Venous DVT Study Indications: Edema.  Comparison Study: No previous exams. Performing Technologist: Rogelia Rohrer  Examination Guidelines: A complete evaluation includes B-mode imaging, spectral Doppler, color Doppler, and power Doppler as needed of all accessible portions of each vessel. Bilateral testing is considered an integral part of a complete examination. Limited examinations for reoccurring indications may be performed as noted. The reflux portion of the exam is performed with the patient in reverse Trendelenburg.  +---------+---------------+---------+-----------+----------+--------------+ RIGHT    CompressibilityPhasicitySpontaneityPropertiesThrombus Aging +---------+---------------+---------+-----------+----------+--------------+ CFV      Full           Yes      Yes                                 +---------+---------------+---------+-----------+----------+--------------+ SFJ      Full                                                        +---------+---------------+---------+-----------+----------+--------------+ FV Prox  Full           Yes      Yes                                 +---------+---------------+---------+-----------+----------+--------------+ FV Mid   Full            Yes      Yes                                 +---------+---------------+---------+-----------+----------+--------------+ FV DistalFull           Yes      Yes                                 +---------+---------------+---------+-----------+----------+--------------+ PFV      Full                                                        +---------+---------------+---------+-----------+----------+--------------+ POP      Full           Yes      Yes                                 +---------+---------------+---------+-----------+----------+--------------+ PTV      Full                                                        +---------+---------------+---------+-----------+----------+--------------+ PERO  Full                                                        +---------+---------------+---------+-----------+----------+--------------+   +---------+---------------+---------+-----------+----------+--------------+ LEFT     CompressibilityPhasicitySpontaneityPropertiesThrombus Aging +---------+---------------+---------+-----------+----------+--------------+ CFV      Full           Yes      Yes                                 +---------+---------------+---------+-----------+----------+--------------+ SFJ      Full                                                        +---------+---------------+---------+-----------+----------+--------------+ FV Prox  Full           Yes      Yes                                 +---------+---------------+---------+-----------+----------+--------------+ FV Mid   Full           Yes      Yes                                 +---------+---------------+---------+-----------+----------+--------------+ FV DistalFull           Yes      Yes                                 +---------+---------------+---------+-----------+----------+--------------+ PFV      Full                                                         +---------+---------------+---------+-----------+----------+--------------+ POP      Full           Yes      Yes                                 +---------+---------------+---------+-----------+----------+--------------+ PTV      Full                                                        +---------+---------------+---------+-----------+----------+--------------+ PERO     Full                                                        +---------+---------------+---------+-----------+----------+--------------+     Summary:  BILATERAL: - No evidence of deep vein thrombosis seen in the lower extremities, bilaterally. - No evidence of superficial venous thrombosis in the lower extremities, bilaterally. -No evidence of popliteal cyst, bilaterally.   *See table(s) above for measurements and observations. Electronically signed by Servando Snare MD on 09/17/2020 at 1:04:38 PM.    Final         Scheduled Meds: . apixaban  2.5 mg Oral BID  . dextrose  2 Tube Oral STAT  . dextrose      . ferrous sulfate  325 mg Oral Q breakfast  . folic acid  1 mg Oral Daily  . latanoprost  1 drop Both Eyes QHS  . levothyroxine  25 mcg Oral Daily  . polyethylene glycol  17 g Oral Daily  . sodium chloride flush  3 mL Intravenous Q12H  . thiamine  100 mg Oral Daily  . torsemide  40 mg Oral BID   Continuous Infusions: . sodium chloride       LOS: 1 day    Time spent: 35 minutes.     Elmarie Shiley, MD [facility]   If 7PM-7AM, please contact night-coverage [email or web address]  09/18/2020, 1:53 PM

## 2020-09-18 NOTE — Discharge Instructions (Signed)
You Need Follow up with your PCP, for lab work to follow sodium level and kidney function. Depending on results your Primary care doctor can consider resuming spironolactone.

## 2020-09-18 NOTE — Progress Notes (Signed)
Hypoglycemic Event  CBG: 45  Treatment: D50 25 mL (12.5 gm)  Symptoms: Pale and confused  Follow-up CBG: Time:11:36 CBG Result:189  Possible Reasons for Event: Inadequate meal intake  Comments/MD notified:Regalado at bedside    Woods Gangemi, [place]

## 2020-09-18 NOTE — Progress Notes (Signed)
   09/18/20 1341  Assess: MEWS Score  Temp 97.8 F (36.6 C)  BP 110/63  Resp 20  SpO2 93 %  O2 Device Nasal Cannula  O2 Flow Rate (L/min) 2 L/min  Assess: MEWS Score  MEWS Temp 0  MEWS Systolic 0  MEWS Pulse 0  MEWS RR 0  MEWS LOC 1  MEWS Score 1  MEWS Score Color Green  Assess: if the MEWS score is Yellow or Red  Were vital signs taken at a resting state? Yes  Focused Assessment No change from prior assessment  Early Detection of Sepsis Score *See Row Information* Low  MEWS guidelines implemented *See Row Information* No, vital signs rechecked  Treat  Pain Scale 0-10  Pain Score 0

## 2020-09-18 NOTE — Progress Notes (Signed)
Pt confused and trying to get out of bed throughout the day and stating that he's dying. Family and provider updated. Per family, pt drinks alcohol daily. Informed provider to order ciwa protocal. Pt requesting to sit on side of bed. Pt assisted to side of bed by nurse with supervision from daughter at the bedside. Will have tech to recheck cbg as pt was hypoglyemic earlier and still not eating much. Will continue to monitor pt.

## 2020-09-18 NOTE — TOC Progression Note (Addendum)
Transition of Care [facility]) - Progression Note    Patient Details  Name: David Acosta MRN: [id number] Date of Birth: May 20, 1926  Transition [facility]) [name]  David Mayo, RN Phone Number: 09/18/2020, 1:42 PM  Clinical Narrative:    NCM spoke with patient , he is ok for this NCM to call daughter.  NCM contacted daughter ,[name], offered choice for [facility] services , she chose [name] she states they are already coming out to see her mother. NCM made referral to [name] with [facility] for [name], [place], [place]. Patient has a walker, cane, bsc and shower chair with grab bars at home.  His son [name] takes him to his MD apts.  Patient uses [facility] for mail in scripts and uses [facility] in [place] for other meds.  Awaiting call back from [name] with [name] to see if he is able to take referral.   NCM received call back from [name] stating he can take referral. Soc will begin 24 to 48 hrs post dc.         Expected Discharge Plan and Services           Expected Discharge Date: 09/18/20                                     Social Determinants of Health (SDOH) Interventions    Readmission Risk Interventions No flowsheet data found.

## 2020-09-19 DIAGNOSIS — I509 Heart failure, unspecified: Secondary | ICD-10-CM | POA: Diagnosis not present

## 2020-09-19 LAB — GLUCOSE, CAPILLARY
Glucose-Capillary: 115 mg/dL — ABNORMAL HIGH (ref 70–99)
Glucose-Capillary: 89 mg/dL (ref 70–99)
Glucose-Capillary: 115 mg/dL — ABNORMAL HIGH (ref 70–99)
Glucose-Capillary: 95 mg/dL (ref 70–99)

## 2020-09-19 LAB — BASIC METABOLIC PANEL
Anion gap: 8 (ref 5–15)
BUN: 35 mg/dL — ABNORMAL HIGH (ref 8–23)
CO2: 30 mmol/L (ref 22–32)
Calcium: 8.6 mg/dL — ABNORMAL LOW (ref 8.9–10.3)
Chloride: 93 mmol/L — ABNORMAL LOW (ref 98–111)
Creatinine, Ser: 1.8 mg/dL — ABNORMAL HIGH (ref 0.61–1.24)
GFR, Estimated: 35 mL/min — ABNORMAL LOW (ref >60)
Glucose, Bld: 110 mg/dL — ABNORMAL HIGH (ref 70–99)
Potassium: 4.8 mmol/L (ref 3.5–5.1)
Sodium: 131 mmol/L — ABNORMAL LOW (ref 135–145)

## 2020-09-19 MED ORDER — TORSEMIDE 20 MG PO TABS: 40.0000 mg | ORAL_TABLET | Freq: Every day | ORAL | 0 refills | Status: DC

## 2020-09-19 NOTE — Progress Notes (Signed)
Pts IV came out this AM. MD made aware and is okay with it not being replaced due to possible d/c today.

## 2020-09-19 NOTE — TOC Transition Note (Addendum)
Transition of Care [name]) - [name]   Patient Details  Name: David Acosta MRN: [id number] Date of Birth: May 21, 1926  [name]) [name]:  [name], RN Phone Number: [phone] 09/19/2020, 12:00 PM   Clinical Narrative:   Spoke with daughter David Acosta who states she would rather drive patient home than for him to use [facility]. However, she states she will [facility] know if this changes.  [name] requests that [name] for outpatient palliative care services. Referral made to [name] with [facility]. States [facility] will follow up with patient on Monday.   Confirmed with [name] with [name] that patient will transition home today 09/19/20. [name] arrangement previously made by weekday RNCM.   Call made to [name] with [employer] for home oxygen. Oxygen to be delivered to room prior to dc.   ADDENDUM: 1250 Telephone call made to David Acosta [phone] to make aware of arrangements. [name] requests that [name] after all. Also requests that home oxygen be delivered to home instead.   1252 Telephone call to [name] with [employer] to request home oxygen delivery instead of hospital room.   1300 [facility] called. Paperwork on unit.      Final next level of care: [place] Barriers to Discharge: No Barriers Identified   Patient Goals and CMS Choice Patient states their goals for this hospitalization and ongoing recovery are:: to return home CMS Medicare.gov Compare Post Acute Care list provided to:: Patient Represenative (must comment) (Daughter- David Acosta) Choice offered to / list presented to : Adult Children (Daughter- David Acosta)  Discharge Placement                       Discharge Plan and Services                              Date [place]: 09/19/20 Time [place]: 1159 Representative spoke with at [place]: [place] Interventions      Readmission Risk Interventions No flowsheet data found.    [name], MSN, RN,BSN Inpatient [facility] Case Manager [phone]

## 2020-09-19 NOTE — Progress Notes (Signed)
[facility])  Referral received for outpatient palliative care services once discharged.  [facility] will follow up with family and patient to begin services.  Venia Carbon RN, BSN, [facility] Liaison

## 2020-09-19 NOTE — Progress Notes (Signed)
SATURATION QUALIFICATIONS: (This note is used to comply with regulatory documentation for home oxygen)  Patient Saturations on Room Air at Rest = 88%  Patient Saturations on 2Liters of oxygen at rest  = 100%  Please briefly explain why patient needs home oxygen:  Pt cannot ambulate.

## 2020-09-19 NOTE — Discharge Summary (Addendum)
Physician Discharge Summary  David Acosta NUU:[id number] DOB: 1925-11-05 DOA: 09/16/2020  PCP: Christain Sacramento, MD  Admit date: 09/16/2020 Discharge date: 09/19/2020  Admitted From: Home  Disposition: Home   Recommendations for Outpatient Follow-up:  1. Follow up with PCP in 1-2 weeks 2. Please obtain BMP/CBC in one week 3. Needs Palliative care evaluation out patient.  Home Health: yes.   Discharge Condition: Vitals stable,confuse CODE STATUS: Partial code Diet recommendation: Heart Healthy   Brief/Interim Summary: 87 year old with past medical history significant for chronic systolic heart failure ejection fraction 45% echo 07/2020, frequent PVC, chronic A. fib on Cardizem and Eliquis, hypothyroidism who presents with increasing shortness of breath and lower extremity edema.   Patient symptoms started 3 weeks prior to admission and progressively getting worse, he has gained 12 pounds over the last 2 or 3 weeks.   Evaluation in the ED chest x-ray showed lung congestion, heart rate ranging from low 30s to 50s.  Blood pressure in the low side.  Mentation at baseline.  Patient develop Acute metabolic encephalopathy, he became confuse, lethargic.    1-Bradycardia;  -Holding Diltiazem.  -Improved. HR 70-80 -Continue to hold diltiazem.  -Cardiology planning heart monitor.   2-Acute on chronic systolic  Heart failure: acute hypoxic respiratory failure;  -Presents with dyspnea, BNP 2000, chest x ray: L pleural effusion, venous congestion.  -ECHO; 07/2020 EF 45 %.  -received  40 mg IV lasix 2/10. -Resume torsemide. plan to change dose to daily due to increase cr. If he start eating more could resume BID doses in few days and if he develops worsening LE edema.  -He has required oxygen supplementation. Will evaluate for home oxygen needs.   3-Elevated D dimer;  Doppler negative for DVT  4-Hyponatremia;  Might be related to heart failure, hypervolemia.  Received a dose of IV  lasix.  Improved, sodium 130  5-Acute on CKD stage IIIb; Cr baseline 1.5. Presents with cr of 1.8 Hypoperfusion form HF or bradycardia.  Cr at 1.8. will decreased torsemide to daily.   6-Hypothyroidism;  -Continue with Synthroid.  7-History of A fib:  -Continue Eliquis, holding diltiazem due to bradycardia.  8-Acute metabolic encephalopathy, delirium; Related to medications. He received a dose of gabapentin. Per daughter he gets confuse with gabapentin. I have discontinue it.  His CBG was low at 45. Improved after amp D 50.  Patient at baseline is alert and oriented.  -Also history of alcohol intake. Plan to order CIWA protocol.  -this could be multifactorial, Hospital delirium, medication related. He received ativan last night, that can make him sleepy. Discussed with family, no plan to pursue MRI brain.  -Plan to discharge home. Will make referral for palliative care.   Discharge Diagnoses:  Active Problems:   Acute CHF (congestive heart failure) (HCC)   CHF (congestive heart failure) [facility])    Discharge Instructions  Discharge Instructions    Diet - low sodium heart healthy   Complete by: As directed    Diet - low sodium heart healthy   Complete by: As directed    Increase activity slowly   Complete by: As directed    Increase activity slowly   Complete by: As directed      Allergies as of 09/19/2020      Reactions   Capsaicin Other (See Comments)   "heart started to race and blood pressure went up"   Fluticasone Other (See Comments)   glaucoma   Procardia Xl [nifedipine] Other (See Comments)  Rapid heart beat   Timolol Swelling      Medication List    STOP taking these medications   diltiazem 240 MG 24 hr capsule Commonly known as: CARDIZEM CD   gabapentin 100 MG capsule Commonly known as: NEURONTIN   lisinopril 5 MG tablet Commonly known as: ZESTRIL   potassium chloride SA 20 MEQ tablet Commonly known as: KLOR-CON   spironolactone 25 MG  tablet Commonly known as: ALDACTONE     TAKE these medications   albuterol 108 (90 Base) MCG/ACT inhaler Commonly known as: VENTOLIN HFA Inhale 2 puffs into the lungs every 4 (four) hours as needed for wheezing.   ALPRAZolam 0.5 MG tablet Commonly known as: XANAX Take 0.5 mg by mouth 2 (two) times daily as needed for anxiety.   apixaban 2.5 MG Tabs tablet Commonly known as: Eliquis Take 1 tablet (2.5 mg total) by mouth 2 (two) times daily.   ferrous sulfate 325 (65 FE) MG tablet Take 325 mg by mouth daily with breakfast.   latanoprost 0.005 % ophthalmic solution Commonly known as: XALATAN Place 1 drop into both eyes at bedtime.   levothyroxine 25 MCG tablet Commonly known as: SYNTHROID Take 25 mcg by mouth daily.   polyethylene glycol 17 g packet Commonly known as: MIRALAX / GLYCOLAX Take 17 g by mouth daily.   PreserVision AREDS 2 Chew Chew 1 each by mouth daily.   torsemide 20 MG tablet Commonly known as: DEMADEX Take 2 tablets (40 mg total) by mouth daily. What changed: when to take this       Follow-up Information    Christain Sacramento, MD Follow up in 1 week(s).   Specialty: Family Medicine Contact information: [address] [place] [address] [phone]        Josue Hector, MD Follow up in 1 week(s).   Specialty: Cardiology Contact information: [address] [address] [phone]        [facility], [facility] Follow up.   Specialty: Home Health Services Why: HHRN,HHPT,HHOT Contact information: [place] [address] [phone]              Allergies  Allergen Reactions  . Capsaicin Other (See Comments)    "heart started to race and blood pressure went up"  . Fluticasone Other (See Comments)    glaucoma  . Procardia Xl [Nifedipine] Other (See Comments)    Rapid heart beat  . Timolol Swelling    Consultations:  Cardiology    Procedures/Studies: DG Chest  Port 1 View  Result Date: 09/17/2020 CLINICAL DATA:  87 year old male with bradycardia, shortness of breath status post COVID-19 three weeks ago. Atrial fibrillation. EXAM: PORTABLE CHEST 1 VIEW COMPARISON:  Portable chest 09/16/2020 and earlier. FINDINGS: Portable AP semi upright view at 0703 hours. Improved lung volumes. Mild cardiomegaly is stable. Other mediastinal contours are within normal limits. Visualized tracheal air column is within normal limits. Veiling bilateral lower lung opacity, dense retrocardiac opacity. But bilateral pulmonary vascularity appears decreased, more normal. No pneumothorax. No air bronchograms. No acute osseous abnormality identified. IMPRESSION: 1. Small to moderate bilateral pleural effusions with lower lobe collapse or consolidation. 2. Normalized pulmonary vascularity since yesterday. Electronically Signed   By: Genevie Ann M.D.   On: 09/17/2020 07:12   DG Chest Portable 1 View  Result Date: 09/16/2020 CLINICAL DATA:  Short of breath, bradycardia EXAM: PORTABLE CHEST 1 VIEW COMPARISON:  03/24/2019 FINDINGS: Stable large cardiac silhouette.  Bilateral small pleural effusions. Mild central venous congestion. No focal infiltrate. No overt pulmonary edema. No pneumothorax. IMPRESSION: Small bilateral pleural effusions and central venous congestion. Electronically Signed   By: Suzy Bouchard M.D.   On: 09/16/2020 12:24   VAS [place] LOWER EXTREMITY VENOUS (DVT)  Result Date: 09/17/2020  Lower Venous DVT Study Indications: Edema.  Comparison Study: No previous exams. Performing Technologist: Rogelia Rohrer  Examination Guidelines: A complete evaluation includes B-mode imaging, spectral Doppler, color Doppler, and power Doppler as needed of all accessible portions of each vessel. Bilateral testing is considered an integral part of a complete examination. Limited examinations for reoccurring indications may be performed as noted. The reflux portion of the exam is performed with the patient  in reverse Trendelenburg.  +---------+---------------+---------+-----------+----------+--------------+ RIGHT    CompressibilityPhasicitySpontaneityPropertiesThrombus Aging +---------+---------------+---------+-----------+----------+--------------+ CFV      Full           Yes      Yes                                 +---------+---------------+---------+-----------+----------+--------------+ SFJ      Full                                                        +---------+---------------+---------+-----------+----------+--------------+ FV Prox  Full           Yes      Yes                                 +---------+---------------+---------+-----------+----------+--------------+ FV Mid   Full           Yes      Yes                                 +---------+---------------+---------+-----------+----------+--------------+ FV DistalFull           Yes      Yes                                 +---------+---------------+---------+-----------+----------+--------------+ PFV      Full                                                        +---------+---------------+---------+-----------+----------+--------------+ POP      Full           Yes      Yes                                 +---------+---------------+---------+-----------+----------+--------------+ PTV      Full                                                        +---------+---------------+---------+-----------+----------+--------------+  PERO     Full                                                        +---------+---------------+---------+-----------+----------+--------------+   +---------+---------------+---------+-----------+----------+--------------+ LEFT     CompressibilityPhasicitySpontaneityPropertiesThrombus Aging +---------+---------------+---------+-----------+----------+--------------+ CFV      Full           Yes      Yes                                  +---------+---------------+---------+-----------+----------+--------------+ SFJ      Full                                                        +---------+---------------+---------+-----------+----------+--------------+ FV Prox  Full           Yes      Yes                                 +---------+---------------+---------+-----------+----------+--------------+ FV Mid   Full           Yes      Yes                                 +---------+---------------+---------+-----------+----------+--------------+ FV DistalFull           Yes      Yes                                 +---------+---------------+---------+-----------+----------+--------------+ PFV      Full                                                        +---------+---------------+---------+-----------+----------+--------------+ POP      Full           Yes      Yes                                 +---------+---------------+---------+-----------+----------+--------------+ PTV      Full                                                        +---------+---------------+---------+-----------+----------+--------------+ PERO     Full                                                        +---------+---------------+---------+-----------+----------+--------------+  Summary: BILATERAL: - No evidence of deep vein thrombosis seen in the lower extremities, bilaterally. - No evidence of superficial venous thrombosis in the lower extremities, bilaterally. -No evidence of popliteal cyst, bilaterally.   *See table(s) above for measurements and observations. Electronically signed by Servando Snare MD on 09/17/2020 at 1:04:38 PM.    Final     Subjective: Patient was sleepy on my initial evaluation, sound asleep.  I came back and he was more alert, he was able to moves upper extremities passively on his own. He was able to answer yes and no, simple question. He was not able to say his address, at baseline he is  able to say his home address.  I discussed option with daughter, plan is to discharge home with home health. Today they were able to get him out of bed with assistance. Discussed option to pursue MRI/CT head, we wont proceed at this time. Also will made referral for palliative care evaluation out patient.   Discharge Exam: Vitals:   09/19/20 0517 09/19/20 0914  BP: 101/62 (!) 106/58  Pulse: 76 78  Resp: 18 14  Temp: 98 F (36.7 C) 98.2 F (36.8 C)  SpO2: 90%      General: sleepy  Cardiovascular: RRR, S1/S2 +, no rubs, no gallops Respiratory: CTA bilaterally, no wheezing, no rhonchi Abdominal: Soft, NT, ND, bowel sounds + Extremities: no edema, no cyanosis    The results of significant diagnostics from this hospitalization (including imaging, microbiology, ancillary and laboratory) are listed below for reference.     Microbiology: Recent Results (from the past 240 hour(s))  Blood Culture (routine x 2)     Status: None (Preliminary result)   Collection Time: 09/16/20 12:38 PM   Specimen: BLOOD  Result Value Ref Range Status   Specimen Description BLOOD RIGHT ANTECUBITAL  Final   Special Requests   Final    BOTTLES DRAWN AEROBIC AND ANAEROBIC Blood Culture results may not be optimal due to an excessive volume of blood received in culture bottles   Culture   Final    NO GROWTH 2 DAYS Performed at [facility], [place] [address] [place], [place] [address]    Report Status PENDING  Incomplete  Blood Culture (routine x 2)     Status: None (Preliminary result)   Collection Time: 09/16/20 12:44 PM   Specimen: BLOOD RIGHT WRIST  Result Value Ref Range Status   Specimen Description BLOOD RIGHT WRIST  Final   Special Requests   Final    BOTTLES DRAWN AEROBIC AND ANAEROBIC Blood Culture adequate volume   Culture   Final    NO GROWTH 2 DAYS Performed at [facility], [place] [address] [place], [place] [address]    Report Status PENDING  Incomplete  Resp Panel by RT-PCR  (Flu A&B, Covid) Nasopharyngeal Swab     Status: None   Collection Time: 09/16/20 12:51 PM   Specimen: Nasopharyngeal Swab; Nasopharyngeal(NP) swabs in vial transport medium  Result Value Ref Range Status   SARS Coronavirus 2 by RT PCR NEGATIVE NEGATIVE Final    Comment: (NOTE) SARS-CoV-2 target nucleic acids are NOT DETECTED.  The SARS-CoV-2 RNA is generally detectable in upper respiratory specimens during the acute phase of infection. The lowest concentration of SARS-CoV-2 viral copies this assay can detect is 138 copies/mL. A negative result does not preclude SARS-Cov-2 infection and should not be used as the sole basis for treatment or other patient management decisions. A negative result may occur  with  improper specimen collection/handling, submission of specimen other than nasopharyngeal swab, presence of viral mutation(s) within the areas targeted by this assay, and inadequate number of viral copies(<138 copies/mL). A negative result must be combined with clinical observations, patient history, and epidemiological information. The expected result is Negative.  Fact Sheet for Patients:  EntrepreneurPulse.com.au  Fact Sheet for Healthcare Providers:  IncredibleEmployment.be  This test is no t yet approved or cleared by the [place] FDA and  has been authorized for detection and/or diagnosis of SARS-CoV-2 by FDA under an Emergency Use Authorization (EUA). This EUA will remain  in effect (meaning this test can be used) for the duration of the COVID-19 declaration under Section 564(b)(1) of the Act, 21 U.S.C.section 360bbb-3(b)(1), unless the authorization is terminated  or revoked sooner.       Influenza A by PCR NEGATIVE NEGATIVE Final   Influenza B by PCR NEGATIVE NEGATIVE Final    Comment: (NOTE) The Xpert Xpress SARS-CoV-2/FLU/RSV plus assay is intended as an aid in the diagnosis of influenza from Nasopharyngeal swab specimens  and should not be used as a sole basis for treatment. Nasal washings and aspirates are unacceptable for Xpert Xpress SARS-CoV-2/FLU/RSV testing.  Fact Sheet for Patients: EntrepreneurPulse.com.au  Fact Sheet for Healthcare Providers: IncredibleEmployment.be  This test is not yet approved or cleared by the [place] FDA and has been authorized for detection and/or diagnosis of SARS-CoV-2 by FDA under an Emergency Use Authorization (EUA). This EUA will remain in effect (meaning this test can be used) for the duration of the COVID-19 declaration under Section 564(b)(1) of the Act, 21 U.S.C. section 360bbb-3(b)(1), unless the authorization is terminated or revoked.  Performed at [facility], [place] [address], [place], [place] [address]      Labs: BNP (last 3 results) Recent Labs    09/16/20 1308  BNP 0,539.7*   Basic Metabolic Panel: Recent Labs  Lab 09/16/20 1309 09/17/20 0217 09/18/20 0127 09/18/20 1737 09/19/20 0415  NA 127* 128* 130* 129* 131*  K 5.1 5.0 4.7 4.9 4.8  CL 88* 92* 92* 91* 93*  CO2 24 24 28 27 30   GLUCOSE 107* 103* 98 126* 110*  BUN 33* 33* 30* 33* 35*  CREATININE 1.86* 1.82* 1.68* 1.92* 1.80*  CALCIUM 9.1 8.7* 8.8* 8.3* 8.6*  MG 2.4  --   --  2.4  --   PHOS  --   --   --  4.0  --    Liver Function Tests: Recent Labs  Lab 09/16/20 1309 09/18/20 1737  AST 21 71*  ALT 16 52*  ALKPHOS 77 101  BILITOT 0.8 0.9  PROT 7.2 6.5  ALBUMIN 3.3* 2.9*   No results for input(s): LIPASE, AMYLASE in the last 168 hours. No results for input(s): AMMONIA in the last 168 hours. CBC: Recent Labs  Lab 09/16/20 1309 09/18/20 0127 09/18/20 1737  WBC 8.6 9.6 7.7  NEUTROABS 6.7  --   --   HGB 10.4* 9.6* 9.8*  HCT 32.7* 29.4* 31.0*  MCV 91.1 92.5 94.2  PLT 388 318 311   Cardiac Enzymes: No results for input(s): CKTOTAL, CKMB, CKMBINDEX, TROPONINI in the last 168 hours. BNP: Invalid input(s):  POCBNP CBG: Recent Labs  Lab 09/18/20 1958 09/18/20 2353 09/19/20 0433 09/19/20 0821 09/19/20 1019  GLUCAP 72 142* 115* 89 115*   D-Dimer Recent Labs    09/16/20 1309  DDIMER 2.65*   Hgb A1c No results for input(s): HGBA1C in the last 72 hours.  Lipid Profile Recent Labs    09/16/20 1309  TRIG 87   Thyroid function studies No results for input(s): TSH, T4TOTAL, T3FREE, THYROIDAB in the last 72 hours.  Invalid input(s): FREET3 Anemia work up Recent Labs    09/16/20 1309  FERRITIN 26   Urinalysis No results found for: COLORURINE, APPEARANCEUR, [place], [place], [place], [place], [place], [place], [place], UROBILINOGEN, NITRITE, LEUKOCYTESUR Sepsis Labs Invalid input(s): PROCALCITONIN,  WBC,  LACTICIDVEN Microbiology Recent Results (from the past 240 hour(s))  Blood Culture (routine x 2)     Status: None (Preliminary result)   Collection Time: 09/16/20 12:38 PM   Specimen: BLOOD  Result Value Ref Range Status   Specimen Description BLOOD RIGHT ANTECUBITAL  Final   Special Requests   Final    BOTTLES DRAWN AEROBIC AND ANAEROBIC Blood Culture results may not be optimal due to an excessive volume of blood received in culture bottles   Culture   Final    NO GROWTH 2 DAYS Performed at [facility], [place] [address], [place], [place] [address]    Report Status PENDING  Incomplete  Blood Culture (routine x 2)     Status: None (Preliminary result)   Collection Time: 09/16/20 12:44 PM   Specimen: BLOOD RIGHT WRIST  Result Value Ref Range Status   Specimen Description BLOOD RIGHT WRIST  Final   Special Requests   Final    BOTTLES DRAWN AEROBIC AND ANAEROBIC Blood Culture adequate volume   Culture   Final    NO GROWTH 2 DAYS Performed at [facility], [place] [address] [place], [place] [address]    Report Status PENDING  Incomplete  Resp Panel by RT-PCR (Flu A&B, Covid) Nasopharyngeal Swab     Status: None   Collection Time: 09/16/20 12:51 PM    Specimen: Nasopharyngeal Swab; Nasopharyngeal(NP) swabs in vial transport medium  Result Value Ref Range Status   SARS Coronavirus 2 by RT PCR NEGATIVE NEGATIVE Final    Comment: (NOTE) SARS-CoV-2 target nucleic acids are NOT DETECTED.  The SARS-CoV-2 RNA is generally detectable in upper respiratory specimens during the acute phase of infection. The lowest concentration of SARS-CoV-2 viral copies this assay can detect is 138 copies/mL. A negative result does not preclude SARS-Cov-2 infection and should not be used as the sole basis for treatment or other patient management decisions. A negative result may occur with  improper specimen collection/handling, submission of specimen other than nasopharyngeal swab, presence of viral mutation(s) within the areas targeted by this assay, and inadequate number of viral copies(<138 copies/mL). A negative result must be combined with clinical observations, patient history, and epidemiological information. The expected result is Negative.  Fact Sheet for Patients:  EntrepreneurPulse.com.au  Fact Sheet for Healthcare Providers:  IncredibleEmployment.be  This test is no t yet approved or cleared by the [place] FDA and  has been authorized for detection and/or diagnosis of SARS-CoV-2 by FDA under an Emergency Use Authorization (EUA). This EUA will remain  in effect (meaning this test can be used) for the duration of the COVID-19 declaration under Section 564(b)(1) of the Act, 21 U.S.C.section 360bbb-3(b)(1), unless the authorization is terminated  or revoked sooner.       Influenza A by PCR NEGATIVE NEGATIVE Final   Influenza B by PCR NEGATIVE NEGATIVE Final    Comment: (NOTE) The Xpert Xpress SARS-CoV-2/FLU/RSV plus assay is intended as an aid in the diagnosis of influenza from Nasopharyngeal swab specimens and should not be used as a sole  basis for treatment. Nasal washings and aspirates are  unacceptable for Xpert Xpress SARS-CoV-2/FLU/RSV testing.  Fact Sheet for Patients: EntrepreneurPulse.com.au  Fact Sheet for Healthcare Providers: IncredibleEmployment.be  This test is not yet approved or cleared by the [place] FDA and has been authorized for detection and/or diagnosis of SARS-CoV-2 by FDA under an Emergency Use Authorization (EUA). This EUA will remain in effect (meaning this test can be used) for the duration of the COVID-19 declaration under Section 564(b)(1) of the Act, 21 U.S.C. section 360bbb-3(b)(1), unless the authorization is terminated or revoked.  Performed at [facility], [place] [address] [place], [place] [address]      Time coordinating discharge: 40 minutes  SIGNED:   Elmarie Shiley, MD  [facility]

## 2020-09-19 NOTE — Progress Notes (Signed)
Pt transported to home  by [facility], one hearing aide on the left ear. Spoke with daughter  [name] made aware that transport just picked up the pt.

## 2020-09-21 ENCOUNTER — Telehealth: Payer: Self-pay

## 2020-09-21 LAB — CULTURE, BLOOD (ROUTINE X 2)
Culture: NO GROWTH
Culture: NO GROWTH
Special Requests: ADEQUATE

## 2020-09-21 NOTE — Telephone Encounter (Signed)
Spoke with patient's daughter [name] and scheduled an in-person Palliative Consult for 09/25/20 @ 10:30AM  COVID screening was negative. No pets in home. Patient lives with wife(has a new consult same day) and son. Daughter will be at consult.   Consent obtained; updated Outlook/Netsmart/Team List and Epic.  Family is aware they will be receiving a call from NP the day before or day of to confirm appointment.

## 2020-09-23 ENCOUNTER — Other Ambulatory Visit: Payer: Self-pay

## 2020-09-23 ENCOUNTER — Emergency Department (HOSPITAL_COMMUNITY): Payer: Medicare HMO

## 2020-09-23 ENCOUNTER — Inpatient Hospital Stay (HOSPITAL_COMMUNITY)
Admission: EM | Admit: 2020-09-23 | Discharge: 2020-09-26 | DRG: 378 | Disposition: A | Discharge status: Hospice | Payer: Medicare HMO | Attending: Family Medicine | Admitting: Family Medicine

## 2020-09-23 ENCOUNTER — Encounter (HOSPITAL_COMMUNITY): Payer: Self-pay | Admitting: Emergency Medicine

## 2020-09-23 DIAGNOSIS — K2270 Barrett's esophagus without dysplasia: Secondary | ICD-10-CM | POA: Diagnosis present

## 2020-09-23 DIAGNOSIS — N183 Chronic kidney disease, stage 3 unspecified: Secondary | ICD-10-CM | POA: Diagnosis present

## 2020-09-23 DIAGNOSIS — H3530 Unspecified macular degeneration: Secondary | ICD-10-CM | POA: Diagnosis present

## 2020-09-23 DIAGNOSIS — K222 Esophageal obstruction: Secondary | ICD-10-CM | POA: Diagnosis present

## 2020-09-23 DIAGNOSIS — R778 Other specified abnormalities of plasma proteins: Secondary | ICD-10-CM | POA: Diagnosis present

## 2020-09-23 DIAGNOSIS — D649 Anemia, unspecified: Secondary | ICD-10-CM

## 2020-09-23 DIAGNOSIS — D62 Acute posthemorrhagic anemia: Secondary | ICD-10-CM | POA: Diagnosis present

## 2020-09-23 DIAGNOSIS — K2980 Duodenitis without bleeding: Secondary | ICD-10-CM | POA: Diagnosis present

## 2020-09-23 DIAGNOSIS — Z8249 Family history of ischemic heart disease and other diseases of the circulatory system: Secondary | ICD-10-CM

## 2020-09-23 DIAGNOSIS — Z8616 Personal history of COVID-19: Secondary | ICD-10-CM

## 2020-09-23 DIAGNOSIS — I4819 Other persistent atrial fibrillation: Secondary | ICD-10-CM | POA: Diagnosis present

## 2020-09-23 DIAGNOSIS — R531 Weakness: Secondary | ICD-10-CM

## 2020-09-23 DIAGNOSIS — K449 Diaphragmatic hernia without obstruction or gangrene: Secondary | ICD-10-CM | POA: Diagnosis present

## 2020-09-23 DIAGNOSIS — Z87891 Personal history of nicotine dependence: Secondary | ICD-10-CM

## 2020-09-23 DIAGNOSIS — E8809 Other disorders of plasma-protein metabolism, not elsewhere classified: Secondary | ICD-10-CM | POA: Diagnosis present

## 2020-09-23 DIAGNOSIS — R9431 Abnormal electrocardiogram [ECG] [EKG]: Secondary | ICD-10-CM

## 2020-09-23 DIAGNOSIS — Z66 Do not resuscitate: Secondary | ICD-10-CM | POA: Diagnosis present

## 2020-09-23 DIAGNOSIS — I429 Cardiomyopathy, unspecified: Secondary | ICD-10-CM | POA: Diagnosis present

## 2020-09-23 DIAGNOSIS — H409 Unspecified glaucoma: Secondary | ICD-10-CM | POA: Diagnosis present

## 2020-09-23 DIAGNOSIS — I130 Hypertensive heart and chronic kidney disease with heart failure and stage 1 through stage 4 chronic kidney disease, or unspecified chronic kidney disease: Secondary | ICD-10-CM | POA: Diagnosis present

## 2020-09-23 DIAGNOSIS — I10 Essential (primary) hypertension: Secondary | ICD-10-CM | POA: Diagnosis present

## 2020-09-23 DIAGNOSIS — Z7901 Long term (current) use of anticoagulants: Secondary | ICD-10-CM

## 2020-09-23 DIAGNOSIS — Z888 Allergy status to other drugs, medicaments and biological substances status: Secondary | ICD-10-CM

## 2020-09-23 DIAGNOSIS — K921 Melena: Principal | ICD-10-CM | POA: Diagnosis present

## 2020-09-23 DIAGNOSIS — N1832 Chronic kidney disease, stage 3b: Secondary | ICD-10-CM | POA: Diagnosis present

## 2020-09-23 DIAGNOSIS — Z20822 Contact with and (suspected) exposure to covid-19: Secondary | ICD-10-CM | POA: Diagnosis present

## 2020-09-23 DIAGNOSIS — Z806 Family history of leukemia: Secondary | ICD-10-CM

## 2020-09-23 DIAGNOSIS — I4891 Unspecified atrial fibrillation: Secondary | ICD-10-CM | POA: Diagnosis present

## 2020-09-23 DIAGNOSIS — I5042 Chronic combined systolic (congestive) and diastolic (congestive) heart failure: Secondary | ICD-10-CM | POA: Diagnosis present

## 2020-09-23 DIAGNOSIS — I5032 Chronic diastolic (congestive) heart failure: Secondary | ICD-10-CM | POA: Diagnosis present

## 2020-09-23 DIAGNOSIS — Z85828 Personal history of other malignant neoplasm of skin: Secondary | ICD-10-CM

## 2020-09-23 DIAGNOSIS — R1319 Other dysphagia: Secondary | ICD-10-CM | POA: Diagnosis present

## 2020-09-23 DIAGNOSIS — I248 Other forms of acute ischemic heart disease: Secondary | ICD-10-CM | POA: Diagnosis present

## 2020-09-23 DIAGNOSIS — K267 Chronic duodenal ulcer without hemorrhage or perforation: Secondary | ICD-10-CM | POA: Diagnosis present

## 2020-09-23 DIAGNOSIS — F0390 Unspecified dementia without behavioral disturbance: Secondary | ICD-10-CM | POA: Diagnosis present

## 2020-09-23 DIAGNOSIS — Z79899 Other long term (current) drug therapy: Secondary | ICD-10-CM

## 2020-09-23 DIAGNOSIS — R4589 Other symptoms and signs involving emotional state: Secondary | ICD-10-CM

## 2020-09-23 DIAGNOSIS — R7989 Other specified abnormal findings of blood chemistry: Secondary | ICD-10-CM | POA: Diagnosis present

## 2020-09-23 DIAGNOSIS — I493 Ventricular premature depolarization: Secondary | ICD-10-CM | POA: Diagnosis present

## 2020-09-23 HISTORY — DX: Unspecified atrial fibrillation: I48.91

## 2020-09-23 HISTORY — DX: Heart failure, unspecified: I50.9

## 2020-09-23 LAB — COMPREHENSIVE METABOLIC PANEL
ALT: 29 U/L (ref 0–44)
AST: 24 U/L (ref 15–41)
Albumin: 2.9 g/dL — ABNORMAL LOW (ref 3.5–5.0)
Alkaline Phosphatase: 55 U/L (ref 38–126)
Anion gap: 9 (ref 5–15)
BUN: 57 mg/dL — ABNORMAL HIGH (ref 8–23)
CO2: 31 mmol/L (ref 22–32)
Calcium: 8.5 mg/dL — ABNORMAL LOW (ref 8.9–10.3)
Chloride: 96 mmol/L — ABNORMAL LOW (ref 98–111)
Creatinine, Ser: 1.61 mg/dL — ABNORMAL HIGH (ref 0.61–1.24)
GFR, Estimated: 40 mL/min — ABNORMAL LOW (ref >60)
Glucose, Bld: 110 mg/dL — ABNORMAL HIGH (ref 70–99)
Potassium: 3.9 mmol/L (ref 3.5–5.1)
Sodium: 136 mmol/L (ref 135–145)
Total Bilirubin: 0.8 mg/dL (ref 0.3–1.2)
Total Protein: 5.9 g/dL — ABNORMAL LOW (ref 6.5–8.1)

## 2020-09-23 LAB — CBC
HCT: 25.4 % — ABNORMAL LOW (ref 39.0–52.0)
Hemoglobin: 8 g/dL — ABNORMAL LOW (ref 13.0–17.0)
MCH: 29.2 pg (ref 26.0–34.0)
MCHC: 31.5 g/dL (ref 30.0–36.0)
MCV: 92.7 fL (ref 80.0–100.0)
Platelets: 212 10*3/uL (ref 150–400)
RBC: 2.74 MIL/uL — ABNORMAL LOW (ref 4.22–5.81)
RDW: 15.9 % — ABNORMAL HIGH (ref 11.5–15.5)
WBC: 9.3 10*3/uL (ref 4.0–10.5)
nRBC: 0.4 % — ABNORMAL HIGH (ref 0.0–0.2)

## 2020-09-23 LAB — TROPONIN I (HIGH SENSITIVITY)
Troponin I (High Sensitivity): 117 ng/L (ref <18)
Troponin I (High Sensitivity): 112 ng/L (ref <18)

## 2020-09-23 LAB — PROTIME-INR
INR: 1.8 — ABNORMAL HIGH (ref 0.8–1.2)
Prothrombin Time: 20 seconds — ABNORMAL HIGH (ref 11.4–15.2)

## 2020-09-23 MED ORDER — ACETAMINOPHEN 650 MG RE SUPP
650.0000 mg | Freq: Four times a day (QID) | RECTAL | Status: DC | PRN
  Filled 2020-09-23: qty 1

## 2020-09-23 MED ORDER — PANTOPRAZOLE SODIUM 40 MG IV SOLR
40.0000 mg | Freq: Two times a day (BID) | INTRAVENOUS | Status: DC
  Administered 2020-09-24 – 2020-09-26 (×6): 40 mg via INTRAVENOUS
  Filled 2020-09-23 (×6): qty 40

## 2020-09-23 MED ORDER — ONDANSETRON HCL 4 MG/2ML IJ SOLN: 4.0000 mg | Freq: Four times a day (QID) | INTRAMUSCULAR | Status: DC | PRN

## 2020-09-23 MED ORDER — ACETAMINOPHEN 325 MG PO TABS: 650.0000 mg | ORAL_TABLET | Freq: Four times a day (QID) | ORAL | Status: DC | PRN

## 2020-09-23 MED ORDER — ONDANSETRON HCL 4 MG PO TABS: 4.0000 mg | ORAL_TABLET | Freq: Four times a day (QID) | ORAL | Status: DC | PRN

## 2020-09-23 NOTE — ED Notes (Signed)
Pt reports he has SI without plan, denies HI, denies AVH. Reports "I am 87 years old and I want to die" "and my kids are hiding something from me". Dr. Ashok Cordia made aware.

## 2020-09-23 NOTE — ED Provider Notes (Signed)
[facility] EMERGENCY DEPARTMENT Provider Note   CSN: [id number] Arrival date & time: 09/23/20  2014     History Chief Complaint  Patient presents with  . Weakness    David Acosta is a 87 y.o. male.  Patient c/o generalized weakness in the past few weeks. Symptoms gradual onset, moderate, constant, generalized. Pt denies acute or abrupt change today, but rather persistence of symptoms. Denies focal or unilateral numbness/weakness. No change in speech or vision. Denies fever, chills or sweats. Denies headache. No neck/back pain. No chest pain. No sob. No abd pain. +generally poor appetite/poor po intake. Denies dysuria. Denies trauma/fall/injury. No syncope. States recently saw pcp w same - when ask if specific dx then, pt states 'he didn't say it but I think that he thinks I have a problem with alcohol....but I dont'. EMS indicates family possible concerned with altered mental status.   The history is provided by the patient and the EMS personnel.  Weakness Associated symptoms: no abdominal pain, no chest pain, no cough, no diarrhea, no dysuria, no fever, no headaches, no shortness of breath and no vomiting        Past Medical History:  Diagnosis Date  . Cancer (HCC)    Skin  . Dysphagia 10/05/2017  . Glaucoma   . Hypertension   . Macular degeneration   . Premature ventricular contraction 2013    Patient Active Problem List   Diagnosis Date Noted  . Acute CHF (congestive heart failure) ([place]) 09/16/2020  . CHF (congestive heart failure) ([place]) 09/16/2020  . Symptomatic bradycardia   . Hyponatremia   . Chest pain 03/24/2019  . Acute diastolic CHF (congestive heart failure) ([place]) 03/24/2019  . Unspecified atrial fibrillation ([place]) 03/24/2019  . Hypokalemia 03/24/2019  . Diastolic heart failure ([place]) 03/24/2019  . Dysphagia 10/05/2017  . Esophageal dysphagia 10/05/2017  . PVC (premature ventricular contraction) 03/12/2013  . HTN (hypertension) 03/12/2013    Past  Surgical History:  Procedure Laterality Date  . CATARACT EXTRACTION W/PHACO Left 01/28/2014   Procedure: CATARACT EXTRACTION PHACO AND INTRAOCULAR LENS PLACEMENT (IOC);  Surgeon: Elta Guadeloupe T. Gershon Crane, MD;  Location: [facility] ORS;  Service: Ophthalmology;  Laterality: Left;  CDE 16.74  . CATARACT EXTRACTION W/PHACO Right 02/18/2014   Procedure: CATARACT EXTRACTION PHACO AND INTRAOCULAR LENS PLACEMENT (IOC);  Surgeon: Elta Guadeloupe T. Gershon Crane, MD;  Location: [facility] ORS;  Service: Ophthalmology;  Laterality: Right;  CDE 9.15  . cholesteoma mastoidectomy Right 2012  . Dupytrens contraction Right 2010  . ESOPHAGEAL DILATION N/A 11/23/2017   Procedure: ESOPHAGEAL DILATION;  Surgeon: Rogene Houston, MD;  Location: [facility] ENDO SUITE;  Service: Endoscopy;  Laterality: N/A;  . ESOPHAGOGASTRODUODENOSCOPY N/A 11/23/2017   Procedure: ESOPHAGOGASTRODUODENOSCOPY (EGD);  Surgeon: Rogene Houston, MD;  Location: [facility] ENDO SUITE;  Service: Endoscopy;  Laterality: N/A;  12:45  . NASAL SEPTUM SURGERY    . TONSILLECTOMY         Family History  Problem Relation Age of Onset  . Leukemia Mother   . Heart attack Father   . Heart failure Sister   . Heart attack Brother   . Uterine cancer Paternal Grandfather     Social History   Tobacco Use  . Smoking status: Former Smoker    Quit date: 01/23/1959    Years since quitting: 61.7  . Smokeless tobacco: Never Used  Vaping Use  . Vaping Use: Never used  Substance Use Topics  . Alcohol use: Yes    Comment: daily, 2.7 oz   .  Drug use: No    Home Medications Prior to Admission medications   Medication Sig Start Date End Date Taking? Authorizing Provider  albuterol (VENTOLIN HFA) 108 (90 Base) MCG/ACT inhaler Inhale 2 puffs into the lungs every 4 (four) hours as needed for wheezing. 09/03/20  Yes [provider]  ALPRAZolam Duanne Moron) 0.5 MG tablet Take 0.5 mg by mouth 2 (two) times daily as needed for anxiety. 02/18/19  Yes [provider]  apixaban (ELIQUIS) 2.5 MG TABS  tablet Take 1 tablet (2.5 mg total) by mouth 2 (two) times daily. 04/08/20  Yes Josue Hector, MD  ferrous sulfate 325 (65 FE) MG tablet Take 325 mg by mouth daily with breakfast.   Yes [provider]  hydrocortisone (ANUSOL-HC) 25 MG suppository Place 25 mg rectally 2 (two) times daily. 08/18/20  Yes [provider]  latanoprost (XALATAN) 0.005 % ophthalmic solution Place 1 drop into both eyes at bedtime.   Yes [provider]  levothyroxine (SYNTHROID) 25 MCG tablet Take 25 mcg by mouth daily. 08/21/20  Yes [provider]  Multiple Vitamins-Minerals (PRESERVISION AREDS 2) CHEW Chew 1 each by mouth daily.   Yes [provider]  polyethylene glycol (MIRALAX / GLYCOLAX) 17 g packet Take 17 g by mouth daily.   Yes [provider]  QUEtiapine (SEROQUEL) 25 MG tablet Take 25 mg by mouth at bedtime. 09/22/20  Yes [provider]  torsemide (DEMADEX) 20 MG tablet Take 2 tablets (40 mg total) by mouth daily. 09/19/20  Yes Regalado, Belkys A, MD    Allergies    Capsaicin, Fluticasone, Procardia xl [nifedipine], and Timolol  Review of Systems   Review of Systems  Constitutional: Negative for chills and fever.  HENT: Negative for sore throat.   Eyes: Negative for visual disturbance.  Respiratory: Negative for cough and shortness of breath.   Cardiovascular: Negative for chest pain.  Gastrointestinal: Negative for abdominal pain, diarrhea and vomiting.  Genitourinary: Negative for dysuria and flank pain.  Musculoskeletal: Negative for back pain and neck pain.  Skin: Negative for rash.  Neurological: Positive for weakness. Negative for speech difficulty and headaches.  Hematological: Does not bruise/bleed easily.  Psychiatric/Behavioral: Negative for agitation.    Physical Exam Updated Vital Signs BP 107/83 (BP Location: Left Arm)   Pulse 88   Temp 97.7 F (36.5 C)   Resp 16   Ht 1.753 m (5\' 9" )   Wt 79.2 kg   SpO2 100%   BMI  25.78 kg/m   Physical Exam Vitals and nursing note reviewed.  Constitutional:      Appearance: Normal appearance. He is well-developed.  HENT:     Head: Atraumatic.     Nose: Nose normal.     Mouth/Throat:     Mouth: Mucous membranes are moist.     Pharynx: Oropharynx is clear.  Eyes:     General: No scleral icterus.    Conjunctiva/sclera: Conjunctivae normal.     Pupils: Pupils are equal, round, and reactive to light.  Neck:     Vascular: No carotid bruit.     Trachea: No tracheal deviation.     Comments: No stiffness or rigidity.  Cardiovascular:     Rate and Rhythm: Normal rate and regular rhythm.     Pulses: Normal pulses.     Heart sounds: Normal heart sounds. No murmur heard. No friction rub. No gallop.   Pulmonary:     Effort: Pulmonary effort is normal. No accessory muscle usage or respiratory  distress.     Breath sounds: Normal breath sounds.  Abdominal:     General: Bowel sounds are normal. There is no distension.     Palpations: Abdomen is soft.     Tenderness: There is no abdominal tenderness. There is no guarding.  Genitourinary:    Comments: No cva tenderness. Very dark brown stool - sent for hemoccult.  Musculoskeletal:     Cervical back: Normal range of motion and neck supple. No rigidity.     Comments: Mild symmetric bilateral ankle swelling.   Skin:    General: Skin is warm and dry.     Findings: No rash.  Neurological:     Mental Status: He is alert.     Comments: Alert, speech clear. No gross dysarthria or aphasia. Motor/sens grossly intact bil.   Psychiatric:     Comments: Depressed mood. No SI.      ED Results / Procedures / Treatments   Labs (all labs ordered are listed, but only abnormal results are displayed) Results for orders placed or performed during the hospital encounter of 09/23/20  Comprehensive metabolic panel  Result Value Ref Range   Sodium 136 135 - 145 mmol/L   Potassium 3.9 3.5 - 5.1 mmol/L   Chloride 96 (L) 98 - 111  mmol/L   CO2 31 22 - 32 mmol/L   Glucose, Bld 110 (H) 70 - 99 mg/dL   BUN 57 (H) 8 - 23 mg/dL   Creatinine, Ser 1.61 (H) 0.61 - 1.24 mg/dL   Calcium 8.5 (L) 8.9 - 10.3 mg/dL   Total Protein 5.9 (L) 6.5 - 8.1 g/dL   Albumin 2.9 (L) 3.5 - 5.0 g/dL   AST 24 15 - 41 U/L   ALT 29 0 - 44 U/L   Alkaline Phosphatase 55 38 - 126 U/L   Total Bilirubin 0.8 0.3 - 1.2 mg/dL   GFR, Estimated 40 (L) >60 mL/min   Anion gap 9 5 - 15  CBC  Result Value Ref Range   WBC 9.3 4.0 - 10.5 K/uL   RBC 2.74 (L) 4.22 - 5.81 MIL/uL   Hemoglobin 8.0 (L) 13.0 - 17.0 g/dL   HCT 25.4 (L) 39.0 - 52.0 %   MCV 92.7 80.0 - 100.0 fL   MCH 29.2 26.0 - 34.0 pg   MCHC 31.5 30.0 - 36.0 g/dL   RDW 15.9 (H) 11.5 - 15.5 %   Platelets 212 150 - 400 K/uL   nRBC 0.4 (H) 0.0 - 0.2 %  Troponin I (High Sensitivity)  Result Value Ref Range   Troponin I (High Sensitivity) 117 (HH) <18 ng/L   CT HEAD WO CONTRAST  Result Date: 09/23/2020 CLINICAL DATA:  Weakness, altered level of consciousness EXAM: CT HEAD WITHOUT CONTRAST TECHNIQUE: Contiguous axial images were obtained from the base of the skull through the vertex without intravenous contrast. COMPARISON:  12/22/2009 FINDINGS: Brain: No acute infarct or hemorrhage. There is mild diffuse cerebral atrophy, age-appropriate. Lateral ventricles and midline structures are unremarkable. No acute extra-axial fluid collections. No mass effect. Vascular: No hyperdense vessel or unexpected calcification. Skull: Normal. Negative for fracture or focal lesion. Sinuses/Orbits: Postsurgical changes from right mastoidectomy. Chronic bilateral mastoid effusions. Paranasal sinuses are clear. Other: None. IMPRESSION: 1. Age-appropriate cerebral atrophy.  No acute intracranial process. Electronically Signed   By: Randa Ngo M.D.   On: 09/23/2020 21:19   DG Chest Port 1 View  Result Date: 09/17/2020 CLINICAL DATA:  87 year old male with bradycardia, shortness of breath status post  COVID-19 three  weeks ago. Atrial fibrillation. EXAM: PORTABLE CHEST 1 VIEW COMPARISON:  Portable chest 09/16/2020 and earlier. FINDINGS: Portable AP semi upright view at 0703 hours. Improved lung volumes. Mild cardiomegaly is stable. Other mediastinal contours are within normal limits. Visualized tracheal air column is within normal limits. Veiling bilateral lower lung opacity, dense retrocardiac opacity. But bilateral pulmonary vascularity appears decreased, more normal. No pneumothorax. No air bronchograms. No acute osseous abnormality identified. IMPRESSION: 1. Small to moderate bilateral pleural effusions with lower lobe collapse or consolidation. 2. Normalized pulmonary vascularity since yesterday. Electronically Signed   By: Genevie Ann M.D.   On: 09/17/2020 07:12   DG Chest Portable 1 View  Result Date: 09/16/2020 CLINICAL DATA:  Short of breath, bradycardia EXAM: PORTABLE CHEST 1 VIEW COMPARISON:  03/24/2019 FINDINGS: Stable large cardiac silhouette. Bilateral small pleural effusions. Mild central venous congestion. No focal infiltrate. No overt pulmonary edema. No pneumothorax. IMPRESSION: Small bilateral pleural effusions and central venous congestion. Electronically Signed   By: Suzy Bouchard M.D.   On: 09/16/2020 12:24   VAS [place] LOWER EXTREMITY VENOUS (DVT)  Result Date: 09/17/2020  Lower Venous DVT Study Indications: Edema.  Comparison Study: No previous exams. Performing Technologist: Rogelia Rohrer  Examination Guidelines: A complete evaluation includes B-mode imaging, spectral Doppler, color Doppler, and power Doppler as needed of all accessible portions of each vessel. Bilateral testing is considered an integral part of a complete examination. Limited examinations for reoccurring indications may be performed as noted. The reflux portion of the exam is performed with the patient in reverse Trendelenburg.  +---------+---------------+---------+-----------+----------+--------------+ RIGHT     CompressibilityPhasicitySpontaneityPropertiesThrombus Aging +---------+---------------+---------+-----------+----------+--------------+ CFV      Full           Yes      Yes                                 +---------+---------------+---------+-----------+----------+--------------+ SFJ      Full                                                        +---------+---------------+---------+-----------+----------+--------------+ FV Prox  Full           Yes      Yes                                 +---------+---------------+---------+-----------+----------+--------------+ FV Mid   Full           Yes      Yes                                 +---------+---------------+---------+-----------+----------+--------------+ FV DistalFull           Yes      Yes                                 +---------+---------------+---------+-----------+----------+--------------+ PFV      Full                                                        +---------+---------------+---------+-----------+----------+--------------+  POP      Full           Yes      Yes                                 +---------+---------------+---------+-----------+----------+--------------+ PTV      Full                                                        +---------+---------------+---------+-----------+----------+--------------+ PERO     Full                                                        +---------+---------------+---------+-----------+----------+--------------+   +---------+---------------+---------+-----------+----------+--------------+ LEFT     CompressibilityPhasicitySpontaneityPropertiesThrombus Aging +---------+---------------+---------+-----------+----------+--------------+ CFV      Full           Yes      Yes                                 +---------+---------------+---------+-----------+----------+--------------+ SFJ      Full                                                         +---------+---------------+---------+-----------+----------+--------------+ FV Prox  Full           Yes      Yes                                 +---------+---------------+---------+-----------+----------+--------------+ FV Mid   Full           Yes      Yes                                 +---------+---------------+---------+-----------+----------+--------------+ FV DistalFull           Yes      Yes                                 +---------+---------------+---------+-----------+----------+--------------+ PFV      Full                                                        +---------+---------------+---------+-----------+----------+--------------+ POP      Full           Yes      Yes                                 +---------+---------------+---------+-----------+----------+--------------+ PTV  Full                                                        +---------+---------------+---------+-----------+----------+--------------+ PERO     Full                                                        +---------+---------------+---------+-----------+----------+--------------+     Summary: BILATERAL: - No evidence of deep vein thrombosis seen in the lower extremities, bilaterally. - No evidence of superficial venous thrombosis in the lower extremities, bilaterally. -No evidence of popliteal cyst, bilaterally.   *See table(s) above for measurements and observations. Electronically signed by Servando Snare MD on 09/17/2020 at 1:04:38 PM.    Final     EKG EKG Interpretation  Date/Time:  Wednesday September 23 2020 20:36:23 EST Ventricular Rate:  104 PR Interval:    QRS Duration: 127 QT Interval:  368 QTC Calculation: 484 R Axis:   134 Text Interpretation: Atrial fibrillation Non-specific intra-ventricular conduction delay Non-specific ST-t changes Confirmed by Lajean Saver [id number]) on 09/23/2020 8:39:00 PM   Radiology CT HEAD WO  CONTRAST  Result Date: 09/23/2020 CLINICAL DATA:  Weakness, altered level of consciousness EXAM: CT HEAD WITHOUT CONTRAST TECHNIQUE: Contiguous axial images were obtained from the base of the skull through the vertex without intravenous contrast. COMPARISON:  12/22/2009 FINDINGS: Brain: No acute infarct or hemorrhage. There is mild diffuse cerebral atrophy, age-appropriate. Lateral ventricles and midline structures are unremarkable. No acute extra-axial fluid collections. No mass effect. Vascular: No hyperdense vessel or unexpected calcification. Skull: Normal. Negative for fracture or focal lesion. Sinuses/Orbits: Postsurgical changes from right mastoidectomy. Chronic bilateral mastoid effusions. Paranasal sinuses are clear. Other: None. IMPRESSION: 1. Age-appropriate cerebral atrophy.  No acute intracranial process. Electronically Signed   By: Randa Ngo M.D.   On: 09/23/2020 21:19    Procedures Procedures   Medications Ordered in ED Medications - No data to display  ED Course  I have reviewed the triage vital signs and the nursing notes.  Pertinent labs & imaging results that were available during my care of the patient were reviewed by me and considered in my medical decision making (see chart for details).    MDM Rules/Calculators/A&P                          Iv ns. Continuous pulse ox and cardiac monitoring. Stat labs. Ecg. Ct.   Reviewed nursing notes and prior charts for additional history.   Labs reviewed/interpreted by me - hgb 8, decreased from prior. Will do hemoccult. Pt notes recent bleeding 'from hemorrhoids' - no active bleeding noted.   CT reviewed/interpreted by me - no hem.   Additional labs reviewed/interpreted by me - trop is mildly elevated, new from prior. Pt denies any current chest pain or discomfort. ecg is noted changed from prior.   Given general weakness, change in ecg from prior, newly elevated trop, decreased hgb - will admit.   Hospitalists  consulted for admission.       Final Clinical Impression(s) / ED Diagnoses Final diagnoses:  None    Rx / DC Orders ED Discharge Orders  None       Lajean Saver, MD 09/23/20 2242

## 2020-09-23 NOTE — H&P (Signed)
History and Physical    David Acosta:[id number] DOB: 02/02/1926 DOA: 09/23/2020  PCP: David Acosta   Patient coming from: Home.  I have personally briefly reviewed patient's old medical records in [place]  Chief Complaint: Weakness.  HPI: David Acosta is a 87 y.o. male with medical history significant of unspecified skin cancer, dysphagia, glaucoma, hypertension, macular degeneration, chronic diastolic heart failure, PVCs, unspecified atrial fibrillation, stage IIIb CKD, recently discharged from [facility] (09/19/2020) due to acute combined diastolic and systolic HF who is coming to the emergency department due to weakness, decreased appetite and heartburn sensation.  He has also felt lightheaded.  The family also told the EMS crew that his mental status was altered, but the patient is oriented now in the ED.  He denies fever, chills, sore throat or rhinorrhea.  He denies productive cough, chest pain, palpitations, diaphoresis, PND orthopnea.  Denies nausea, emesis, diarrhea, constipation, dysuria, frequency or hematuria.  No polyuria, polydipsia, polyphagia or blurred vision.  ED Course: Initial vital signs were temperature 97.7 F, pulse 88, respirations 16, BP 107/83 mmHg O2 sat 100% on 2 LPM via [place].  Dr. Ashok Cordia described melanotic stools on exam.  No medications were given in the ED.  Labwork: CBC showed a white count of 9.3, hemoglobin 8.0 g/dL and platelets 212.  PT was 20.0 and INR 1.8.  Troponin was 117 and then 112 ng/L.  CMP shows a chloride 96 mmol/L, all other electrolytes are within normal range when calcium is corrected to albumin.  Glucose 113, BUN 57 and creatinine 1.61 mg/dL.  Total protein 5.9 and albumin 2.9 g/dL, the rest of the CMP results are within expected range.  Imaging: CT head without contrast shows cerebral atrophy without any acute intracranial abnormality.  Please see images and full radiology report for further detail.  Review of Systems: As per  HPI otherwise all other systems reviewed and are negative.  Past Medical History:  Diagnosis Date  . Cancer (HCC)    Skin  . Dysphagia 10/05/2017  . Glaucoma   . Hypertension   . Macular degeneration   . Premature ventricular contraction 2013    Past Surgical History:  Procedure Laterality Date  . CATARACT EXTRACTION W/PHACO Left 01/28/2014   Procedure: CATARACT EXTRACTION PHACO AND INTRAOCULAR LENS PLACEMENT (IOC);  Surgeon: David Guadeloupe T. Gershon Crane, Acosta;  Location: [facility];  Service: Ophthalmology;  Laterality: Left;  CDE 16.74  . CATARACT EXTRACTION W/PHACO Right 02/18/2014   Procedure: CATARACT EXTRACTION PHACO AND INTRAOCULAR LENS PLACEMENT (IOC);  Surgeon: David Guadeloupe T. Gershon Crane, Acosta;  Location: [facility];  Service: Ophthalmology;  Laterality: Right;  CDE 9.15  . cholesteoma mastoidectomy Right 2012  . Dupytrens contraction Right 2010  . ESOPHAGEAL DILATION N/A 11/23/2017   Procedure: ESOPHAGEAL DILATION;  Surgeon: David Acosta;  Location: [facility];  Service: Endoscopy;  Laterality: N/A;  . ESOPHAGOGASTRODUODENOSCOPY N/A 11/23/2017   Procedure: ESOPHAGOGASTRODUODENOSCOPY (EGD);  Surgeon: David Acosta;  Location: [facility];  Service: Endoscopy;  Laterality: N/A;  12:45  . NASAL SEPTUM SURGERY    . TONSILLECTOMY      Social History  reports that he quit smoking about 61 years ago. He has never used smokeless tobacco. He reports current alcohol use. He reports that he does not use drugs.  Allergies  Allergen Reactions  . Capsaicin Other (See Comments)    "heart started to race and blood pressure went up"  . Fluticasone Other (See Comments)  glaucoma  . Procardia Xl [Nifedipine] Other (See Comments)    Rapid heart beat  . Timolol Swelling    Family History  Problem Relation Age of Onset  . Leukemia Mother   . Heart attack Father   . Heart failure Sister   . Heart attack Brother   . Uterine cancer Paternal Grandfather    Prior to Admission medications   Medication  Sig Start Date End Date Taking? Authorizing Provider  albuterol (VENTOLIN HFA) 108 (90 Base) MCG/ACT inhaler Inhale 2 puffs into the lungs every 4 (four) hours as needed for wheezing. 09/03/20  Yes Provider, Historical, Acosta  ALPRAZolam Duanne Moron) 0.5 MG tablet Take 0.5 mg by mouth 2 (two) times daily as needed for anxiety. 02/18/19  Yes Provider, Historical, Acosta  apixaban (ELIQUIS) 2.5 MG TABS tablet Take 1 tablet (2.5 mg total) by mouth 2 (two) times daily. 04/08/20  Yes David Acosta  ferrous sulfate 325 (65 FE) MG tablet Take 325 mg by mouth daily with breakfast.   Yes Provider, Historical, Acosta  hydrocortisone (ANUSOL-HC) 25 MG suppository Place 25 mg rectally 2 (two) times daily. 08/18/20  Yes Provider, Historical, Acosta  latanoprost (XALATAN) 0.005 % ophthalmic solution Place 1 drop into both eyes at bedtime.   Yes Provider, Historical, Acosta  levothyroxine (SYNTHROID) 25 MCG tablet Take 25 mcg by mouth daily. 08/21/20  Yes Provider, Historical, Acosta  Multiple Vitamins-Minerals (PRESERVISION AREDS 2) CHEW Chew 1 each by mouth daily.   Yes Provider, Historical, Acosta  polyethylene glycol (MIRALAX / GLYCOLAX) 17 g packet Take 17 g by mouth daily.   Yes Provider, Historical, Acosta  QUEtiapine (SEROQUEL) 25 MG tablet Take 25 mg by mouth at bedtime. 09/22/20  Yes Provider, Historical, Acosta  torsemide (DEMADEX) 20 MG tablet Take 2 tablets (40 mg total) by mouth daily. 09/19/20  Yes David Acosta    Physical Exam: Vitals:   09/23/20 2024 09/23/20 2200 09/23/20 2300 09/23/20 2320  BP:  107/68 (!) 92/57 103/63  Pulse:   (!) 104 (!) 110  Resp:  (!) 30  (!) 21  Temp:      SpO2:   92% 94%  Weight: 79.2 kg     Height: 5\' 9"  (1.753 m)       Constitutional: NAD, calm, comfortable Eyes: PERRL, lids and conjunctivae are pale. ENMT: Mucous membranes are moist. Posterior pharynx clear of any exudate or lesions. Neck: normal, supple, no masses, no thyromegaly Respiratory: clear to auscultation bilaterally, no  wheezing, no crackles. Normal respiratory effort. No accessory muscle use.  Cardiovascular: Tachycardic in the low 100s with an irregularly irregular rhythm, no murmurs / rubs / gallops.  3+ bilateral lower extremity pitting edema. 2+ pedal pulses. No carotid bruits.  Abdomen: Mildly distended with an umbilical hernia and signs of caput medusa.  Bowel sounds positive.  Soft, no tenderness, no masses palpated. No hepatosplenomegaly. Musculoskeletal: Moderate generalized weakness.  No clubbing / cyanosis. Good ROM, no contractures. Normal muscle tone.  Skin: Some areas of ecchymosis on extremities. Neurologic: CN 2-12 grossly intact. Sensation intact, DTR normal. Strength 5/5 in all 4.  Psychiatric: Normal judgment and insight. Alert and oriented x 3. Normal mood.   Labs on Admission: I have personally reviewed following labs and imaging studies  CBC: Recent Labs  Lab 09/18/20 0127 09/18/20 1737 09/23/20 2142  WBC 9.6 7.7 9.3  HGB 9.6* 9.8* 8.0*  HCT 29.4* 31.0* 25.4*  MCV 92.5 94.2 92.7  PLT 318 311 212  Basic Metabolic Panel: Recent Labs  Lab 09/17/20 0217 09/18/20 0127 09/18/20 1737 09/19/20 0415 09/23/20 2142  NA 128* 130* 129* 131* 136  K 5.0 4.7 4.9 4.8 3.9  CL 92* 92* 91* 93* 96*  CO2 24 28 27 30 31   GLUCOSE 103* 98 126* 110* 110*  BUN 33* 30* 33* 35* 57*  CREATININE 1.82* 1.68* 1.92* 1.80* 1.61*  CALCIUM 8.7* 8.8* 8.3* 8.6* 8.5*  MG  --   --  2.4  --   --   PHOS  --   --  4.0  --   --     GFR: Estimated Creatinine Clearance: 29.3 mL/min (A) (by C-G formula based on SCr of 1.61 mg/dL (H)).  Liver Function Tests: Recent Labs  Lab 09/18/20 1737 09/23/20 2142  AST 71* 24  ALT 52* 29  ALKPHOS 101 55  BILITOT 0.9 0.8  PROT 6.5 5.9*  ALBUMIN 2.9* 2.9*    Urine analysis: No results found for: COLORURINE, APPEARANCEUR, LABSPEC, PHURINE, GLUCOSEU, HGBUR, BILIRUBINUR, KETONESUR, PROTEINUR, UROBILINOGEN, NITRITE, LEUKOCYTESUR  Radiological Exams on  Admission: CT HEAD WO CONTRAST  Result Date: 09/23/2020 CLINICAL DATA:  Weakness, altered level of consciousness EXAM: CT HEAD WITHOUT CONTRAST TECHNIQUE: Contiguous axial images were obtained from the base of the skull through the vertex without intravenous contrast. COMPARISON:  12/22/2009 FINDINGS: Brain: No acute infarct or hemorrhage. There is mild diffuse cerebral atrophy, age-appropriate. Lateral ventricles and midline structures are unremarkable. No acute extra-axial fluid collections. No mass effect. Vascular: No hyperdense vessel or unexpected calcification. Skull: Normal. Negative for fracture or focal lesion. Sinuses/Orbits: Postsurgical changes from right mastoidectomy. Chronic bilateral mastoid effusions. Paranasal sinuses are clear. Other: None. IMPRESSION: 1. Age-appropriate cerebral atrophy.  No acute intracranial process. Electronically Signed   By: Randa Ngo M.D.   On: 09/23/2020 21:19   08/04/2020 echocardiogram IMPRESSIONS:  1. Apical windows: difficult to see endocardium well. Hypokinesis worse  in inferior/inferoseptal and lateral walls Would recomm limited echo with  Definity to further define wall motion/ LVEF   Previous study very difficult to compare due to acoustic  windows/foreshortening.. Left ventricular ejection fraction, by  estimation, is 45%. The left ventricle has mild to moderately decreased  function. The left ventricle demonstrates global  hypokinesis. The left ventricular internal cavity size was mildly dilated.  Left ventricular diastolic parameters are indeterminate.  2. Right ventricular systolic function mild to moderately reduced. The  right ventricular size is mildly enlarged. There is mildly elevated  pulmonary artery systolic pressure.  3. Left atrial size was severely dilated.  4. Right atrial size was severely dilated.  5. The mitral valve is normal in structure. Mild mitral valve  regurgitation.  6. Tricuspid valve regurgitation  is mild to moderate.  7. The aortic valve is abnormal. Aortic valve regurgitation is not  visualized. Mild to moderate aortic valve sclerosis/calcification is  present, without any evidence of aortic stenosis.  8. The inferior vena cava is dilated in size with >50% respiratory  variability, suggesting right atrial pressure of 8 mmHg.  EKG: Independently reviewed. Vent. rate 104 BPM PR interval * ms QRS duration 127 ms QT/QTc 368/484 ms P-R-T axes * 134 -64 Atrial fibrillation Non-specific intra-ventricular conduction delay Non-specific ST-t changes  Assessment/Plan Principal Problem:   Melena   Acute blood loss anemia Observation/telemetry. Keep NPO. Pantoprazole 40 mg IVP every 12 hours. Monitor H&H. Obtain type and screen. Transfuse as needed. Consult GI in a.m.  Active Problems:   HTN (hypertension) Hold torsemide. Monitor BP,  renal function electrolytes.    Unspecified atrial fibrillation (HCC) CHA?DS?-VASc Score of at least 5. Hold apixaban due to melena. Not on rate control meds.    Elevated troponin Likely demand ischemia from blood loss. No chest pain and level is stable.    Chronic combined diastolic/systolic heart failure (HCC) No acute decompensation. Hold diuretic due to soft BP readings. Allergic to timolol. No ACE/ARB due to CKD.    Chronic renal disease, stage 3b (HCC) GFR improved since last measurement. Monitor renal function electrolytes.    Hypoalbuminemia Secondary to poor oral intake/EtOH use. Consider nutritional services evaluation.    Glaucoma Continue Xalatan drops at bedtime.    DVT prophylaxis: On apixaban (held due to melena). Code Status:    DNR. Family Communication: Disposition Plan:   Patient is from:  Home.  Anticipated DC to:  Home.  Anticipated DC date:  September 25, 2020.  Anticipated DC barriers: Clinical status/GI consult sign off.  Consults called:  Routine GI consult in the morning. Admission status:   Observation/telemetry.  High after presenting with weakness secondary to melena/acute blood loss anemia.  The patient will remain for H&H monitoring, as needed transfusion and evaluation by gastroenterology in the morning.  Severity of Illness:  Reubin Milan Acosta [facility]  How to contact the [facility] Attending or Consulting provider [place] or covering provider during after hours [place], for this patient?   1. Check the care team in [facility] and look for a) attending/consulting TRH provider listed and b) the [facility] team listed 2. Log into [email or web address] and use [place]'s universal password to access. If you do not have the password, please contact the hospital operator. 3. Locate the [facility] provider you are looking for under [facility] and page to a number that you can be directly reached. 4. If you still have difficulty reaching the provider, please page the [facility] (Director on Call) for the Hospitalists listed on amion for assistance.  09/23/2020, 11:41 PM   This document was prepared using Dragon voice recognition software and may contain some unintended transcription errors.

## 2020-09-23 NOTE — H&P (Incomplete)
History and Physical    David Acosta:[id number] DOB: 1926-04-30 DOA: 09/23/2020  PCP: Christain Sacramento, MD (Confirm with patient/family/NH records and if not entered, this has to be entered at [facility] point of entry) Patient coming from: ***  I have personally briefly reviewed patient's old medical records in [place]  Chief Complaint: ***  HPI: David Acosta is a 87 y.o. male with medical history significant of *** (For level 3, the HPI must include 4+ descriptors: Location, Quality, Severity, Duration, Timing, Context, modifying factors, associated signs/symptoms and/or status of 3+ chronic problems.)  (Please avoid self-populating past medical history here) (The initial 2-3 lines should be focused and good to copy and paste in the HPI section of the daily progress note).  ED Course: ***  Review of Systems: As per HPI otherwise all other systems reviewed and are negative. Unacceptable ROS statements: "10 systems reviewed," "Extensive" (without elaboration).  Acceptable ROS statements: "All others negative," "All others reviewed and are negative," and "All others unremarkable," with at [place] documented Can't double dip - if using for HPI can't use for ROS  Past Medical History:  Diagnosis Date  . Cancer (HCC)    Skin  . Dysphagia 10/05/2017  . Glaucoma   . Hypertension   . Macular degeneration   . Premature ventricular contraction 2013    Past Surgical History:  Procedure Laterality Date  . CATARACT EXTRACTION W/PHACO Left 01/28/2014   Procedure: CATARACT EXTRACTION PHACO AND INTRAOCULAR LENS PLACEMENT (IOC);  Surgeon: Elta Guadeloupe T. Gershon Crane, MD;  Location: [facility];  Service: Ophthalmology;  Laterality: Left;  CDE 16.74  . CATARACT EXTRACTION W/PHACO Right 02/18/2014   Procedure: CATARACT EXTRACTION PHACO AND INTRAOCULAR LENS PLACEMENT (IOC);  Surgeon: Elta Guadeloupe T. Gershon Crane, MD;  Location: [facility];  Service: Ophthalmology;  Laterality: Right;  CDE 9.15  . cholesteoma  mastoidectomy Right 2012  . Dupytrens contraction Right 2010  . ESOPHAGEAL DILATION N/A 11/23/2017   Procedure: ESOPHAGEAL DILATION;  Surgeon: Rogene Houston, MD;  [facility];  Service: Endoscopy;  Laterality: N/A;  . ESOPHAGOGASTRODUODENOSCOPY N/A 11/23/2017   Procedure: ESOPHAGOGASTRODUODENOSCOPY (EGD);  Surgeon: Rogene Houston, MD;  [facility];  Service: Endoscopy;  Laterality: N/A;  12:45  . NASAL SEPTUM SURGERY    . TONSILLECTOMY      Social History  reports that he quit smoking about 61 years ago. He has never used smokeless tobacco. He reports current alcohol use. He reports that he does not use drugs.  Allergies  Allergen Reactions  . Capsaicin Other (See Comments)    "heart started to race and blood pressure went up"  . Fluticasone Other (See Comments)    glaucoma  . Procardia Xl [Nifedipine] Other (See Comments)    Rapid heart beat  . Timolol Swelling    Family History  Problem Relation Age of Onset  . Leukemia Mother   . Heart attack Father   . Heart failure Sister   . Heart attack Brother   . Uterine cancer Paternal Grandfather    *** Unacceptable: Noncontributory, unremarkable, or negative. Acceptable: (example)Family history negative for heart disease  Prior to Admission medications   Medication Sig Start Date End Date Taking? Authorizing Provider  albuterol (VENTOLIN HFA) 108 (90 Base) MCG/ACT inhaler Inhale 2 puffs into the lungs every 4 (four) hours as needed for wheezing. 09/03/20  Yes [provider]  ALPRAZolam Duanne Moron) 0.5 MG tablet Take 0.5 mg by mouth 2 (two) times daily as  needed for anxiety. 02/18/19  Yes [provider]  apixaban (ELIQUIS) 2.5 MG TABS tablet Take 1 tablet (2.5 mg total) by mouth 2 (two) times daily. 04/08/20  Yes Josue Hector, MD  ferrous sulfate 325 (65 FE) MG tablet Take 325 mg by mouth daily with breakfast.   Yes [provider]  hydrocortisone (ANUSOL-HC) 25 MG suppository  Place 25 mg rectally 2 (two) times daily. 08/18/20  Yes [provider]  latanoprost (XALATAN) 0.005 % ophthalmic solution Place 1 drop into both eyes at bedtime.   Yes [provider]  levothyroxine (SYNTHROID) 25 MCG tablet Take 25 mcg by mouth daily. 08/21/20  Yes [provider]  Multiple Vitamins-Minerals (PRESERVISION AREDS 2) CHEW Chew 1 each by mouth daily.   Yes [provider]  polyethylene glycol (MIRALAX / GLYCOLAX) 17 g packet Take 17 g by mouth daily.   Yes [provider]  QUEtiapine (SEROQUEL) 25 MG tablet Take 25 mg by mouth at bedtime. 09/22/20  Yes [provider]  torsemide (DEMADEX) 20 MG tablet Take 2 tablets (40 mg total) by mouth daily. 09/19/20  Yes Elmarie Shiley, MD    Physical Exam: Vitals:   09/23/20 2024 09/23/20 2200 09/23/20 2300 09/23/20 2320  BP:  107/68 (!) 92/57 103/63  Pulse:   (!) 104 (!) 110  Resp:  (!) 30  (!) 21  Temp:      SpO2:   92% 94%  Weight: 79.2 kg     Height: 5\' 9"  (1.753 m)       Constitutional: NAD, calm, comfortable Vitals:   09/23/20 2024 09/23/20 2200 09/23/20 2300 09/23/20 2320  BP:  107/68 (!) 92/57 103/63  Pulse:   (!) 104 (!) 110  Resp:  (!) 30  (!) 21  Temp:      SpO2:   92% 94%  Weight: 79.2 kg     Height: 5\' 9"  (1.753 m)      Eyes: PERRL, lids and conjunctivae normal ENMT: Mucous membranes are moist. Posterior pharynx clear of any exudate or lesions.Normal dentition.  Neck: normal, supple, no masses, no thyromegaly Respiratory: clear to auscultation bilaterally, no wheezing, no crackles. Normal respiratory effort. No accessory muscle use.  Cardiovascular: Regular rate and rhythm, no murmurs / rubs / gallops. No extremity edema. 2+ pedal pulses. No carotid bruits.  Abdomen: no tenderness, no masses palpated. No hepatosplenomegaly. Bowel sounds positive.  Musculoskeletal: no clubbing / cyanosis. No joint deformity upper and lower extremities. Good ROM, no  contractures. Normal muscle tone.  Skin: no rashes, lesions, ulcers. No induration Neurologic: CN 2-12 grossly intact. Sensation intact, DTR normal. Strength 5/5 in all 4.  Psychiatric: Normal judgment and insight. Alert and oriented x 3. Normal mood.   (Anything < 9 systems with 2 bullets each down codes to level 1) (If patient refuses exam can't bill higher level) (Make sure to document decubitus ulcers present on admission -- if possible -- and whether patient has chronic indwelling catheter at time of admission)  Labs on Admission: I have personally reviewed following labs and imaging studies  CBC: Recent Labs  Lab 09/18/20 0127 09/18/20 1737 09/23/20 2142  WBC 9.6 7.7 9.3  HGB 9.6* 9.8* 8.0*  HCT 29.4* 31.0* 25.4*  MCV 92.5 94.2 92.7  PLT 318 311 182    Basic Metabolic Panel: Recent Labs  Lab 09/17/20 0217 09/18/20 0127 09/18/20 1737 09/19/20 0415 09/23/20 2142  NA 128* 130* 129* 131* 136  K 5.0 4.7 4.9 4.8 3.9  CL 92* 92* 91* 93* 96*  CO2 24 28 27 30 31   GLUCOSE 103* 98 126* 110* 110*  BUN 33* 30* 33* 35* 57*  CREATININE 1.82* 1.68* 1.92* 1.80* 1.61*  CALCIUM 8.7* 8.8* 8.3* 8.6* 8.5*  MG  --   --  2.4  --   --   PHOS  --   --  4.0  --   --     GFR: Estimated Creatinine Clearance: 29.3 mL/min (A) (by C-G formula based on SCr of 1.61 mg/dL (H)).  Liver Function Tests: Recent Labs  Lab 09/18/20 1737 09/23/20 2142  AST 71* 24  ALT 52* 29  ALKPHOS 101 55  BILITOT 0.9 0.8  PROT 6.5 5.9*  ALBUMIN 2.9* 2.9*    Urine analysis: No results found for: COLORURINE, APPEARANCEUR, LABSPEC, PHURINE, GLUCOSEU, HGBUR, BILIRUBINUR, KETONESUR, PROTEINUR, UROBILINOGEN, NITRITE, LEUKOCYTESUR  Radiological Exams on Admission: CT HEAD WO CONTRAST  Result Date: 09/23/2020 CLINICAL DATA:  Weakness, altered level of consciousness EXAM: CT HEAD WITHOUT CONTRAST TECHNIQUE: Contiguous axial images were obtained from the base of the skull through the vertex without intravenous  contrast. COMPARISON:  12/22/2009 FINDINGS: Brain: No acute infarct or hemorrhage. There is mild diffuse cerebral atrophy, age-appropriate. Lateral ventricles and midline structures are unremarkable. No acute extra-axial fluid collections. No mass effect. Vascular: No hyperdense vessel or unexpected calcification. Skull: Normal. Negative for fracture or focal lesion. Sinuses/Orbits: Postsurgical changes from right mastoidectomy. Chronic bilateral mastoid effusions. Paranasal sinuses are clear. Other: None. IMPRESSION: 1. Age-appropriate cerebral atrophy.  No acute intracranial process. Electronically Signed   By: Randa Ngo M.D.   On: 09/23/2020 21:19    EKG: Independently reviewed. Vent. rate 104 BPM PR interval * ms QRS duration 127 ms QT/QTc 368/484 ms P-R-T axes * 134 -64 Atrial fibrillation Non-specific intra-ventricular conduction delay Non-specific ST-t changes  Assessment/Plan Principal Problem:   Melena   Acute blood loss anemia Observation/telemetry. Keep NPO. Pantoprazole 40 mg IVP every 12 hours. Monitor H&H. Obtain type and screen. Transfuse as needed. Consult GI in a.m.  Active Problems:   HTN (hypertension) Hold torsemide. Monitor BP, renal function electrolytes.    Unspecified atrial fibrillation (HCC)  Elevated troponin   Chronic diastolic heart failure (HCC)   Hypoalbuminemia    Glaucoma     DVT prophylaxis: On apixaban (held due to melena). Code Status:   Full code. Family Communication: Disposition Plan:   Patient is from:  Home.  Anticipated DC to:  Home.  Anticipated DC date:  September 25, 2020.  Anticipated DC barriers: Clinical status/GI consult sign off.  Consults called:  Routine GI consult in the morning. Admission status:  Observation/telemetry.  High after presenting with weakness secondary to melena/acute blood loss anemia.  The patient will remain for H&H monitoring, as needed transfusion and evaluation by gastroenterology in the  morning.  Severity of Illness:  Reubin Milan MD [facility]  How to contact the [facility] Attending or Consulting provider [place] or covering provider during after hours [place], for this patient?   1. Check the care team in [facility] and look for a) attending/consulting TRH provider listed and b) the [facility] team listed 2. Log into [email or web address] and use [place]'s universal password to access. If you do not have the password, please contact the hospital operator. 3. Locate the [facility] provider you are looking for under [facility] and page to a number that you can be directly reached. 4. If you still have difficulty reaching the provider,  please page the [facility] (Director on Call) for the Hospitalists listed on amion for assistance.  09/23/2020, 11:41 PM   This document was prepared using Dragon voice recognition software and may contain some unintended transcription errors.

## 2020-09-23 NOTE — ED Notes (Addendum)
Date and time results received: 09/23/20 2235 Test: troponin Critical Value: 117 Name of Provider Notified: Dr. Ashok Cordia Orders Received? Or Actions Taken?: Orders for troponin and covid swab placed for admission

## 2020-09-23 NOTE — Progress Notes (Deleted)
CARDIOLOGY CONSULT NOTE       Patient ID: David Acosta MRN: [id number] DOB/AGE: Dec 12, 1925 87 y.o.  Admit date: (Not on file) Referring Physician: Redmond Pulling Primary Physician: Christain Sacramento, MD Primary Cardiologist: New Reason for Consultation: Afib/CHF  Active Problems:   * No active hospital problems. *   HPI:  87 y.o. f/u  for afib and CHF. History of  chronic PVCls since 1970's Normal cath in 2002. Normal ETT 2014 Holter only 3% PVC;s EF 60-65% by echo Did better on verapamil thought lopressor made him worse Admitted August 2020  with chest pain precordial radiating to back. He had LE edema CT no dissection Thought to have diastolic CHF Rx with lasix. Echo with no significant valve disease and noted to have "new" afib on telemetry LA diameter on echo was 3.8 cm CXR with vascular congestion BNP only 147.     CHADVASC 4 Monitor 05/14/19 showed 100% chronic afib with good rate control   Myovue 06/12/19 normal no ischemia EF 67%   Hospitalized 09/19/20 with volume overload, CHF, slow afib requiring cardizem to be d/c and encephalopathy  ? Related to Gabapentin and his ETOH use Drinks vodka daily D/C labs showed BUN 35/Cr 1.8 K 4.8 Hct 31  Dr Irish Lack arranged outpatient monitor to make sure no long pauses / excess bradycardia off cardizem   Echo 08/04/20 EF 45% severe bi atrial enlargement mild MR   ***  ROS All other systems reviewed and negative except as noted above  Past Medical History:  Diagnosis Date  . Cancer (HCC)    Skin  . Dysphagia 10/05/2017  . Glaucoma   . Hypertension   . Macular degeneration   . Premature ventricular contraction 2013    Family History  Problem Relation Age of Onset  . Leukemia Mother   . Heart attack Father   . Heart failure Sister   . Heart attack Brother   . Uterine cancer Paternal Grandfather     Social History   Socioeconomic History  . Marital status: Married    Spouse name: Not on file  . Number of children: Not on file   . Years of education: Not on file  . Highest education level: Not on file  Occupational History  . Not on file  Tobacco Use  . Smoking status: Former Smoker    Quit date: 01/23/1959    Years since quitting: 61.7  . Smokeless tobacco: Never Used  Vaping Use  . Vaping Use: Never used  Substance and Sexual Activity  . Alcohol use: Yes    Comment: daily, 2.7 oz   . Drug use: No  . Sexual activity: Not on file  Other Topics Concern  . Not on file  Social History Narrative  . Not on file   Social Determinants of Health   Financial Resource Strain: Not on file  Food Insecurity: Not on file  Transportation Needs: Not on file  Physical Activity: Not on file  Stress: Not on file  Social Connections: Not on file  Intimate Partner Violence: Not on file    Past Surgical History:  Procedure Laterality Date  . CATARACT EXTRACTION W/PHACO Left 01/28/2014   Procedure: CATARACT EXTRACTION PHACO AND INTRAOCULAR LENS PLACEMENT (IOC);  Surgeon: Elta Guadeloupe T. Gershon Crane, MD;  Location: [facility];  Service: Ophthalmology;  Laterality: Left;  CDE 16.74  . CATARACT EXTRACTION W/PHACO Right 02/18/2014   Procedure: CATARACT EXTRACTION PHACO AND INTRAOCULAR LENS PLACEMENT (IOC);  Surgeon: Elta Guadeloupe T. Gershon Crane, MD;  Location:  [facility];  Service: Ophthalmology;  Laterality: Right;  CDE 9.15  . cholesteoma mastoidectomy Right 2012  . Dupytrens contraction Right 2010  . ESOPHAGEAL DILATION N/A 11/23/2017   Procedure: ESOPHAGEAL DILATION;  Surgeon: Rogene Houston, MD;  Location: [facility];  Service: Endoscopy;  Laterality: N/A;  . ESOPHAGOGASTRODUODENOSCOPY N/A 11/23/2017   Procedure: ESOPHAGOGASTRODUODENOSCOPY (EGD);  Surgeon: Rogene Houston, MD;  Location: [facility];  Service: Endoscopy;  Laterality: N/A;  12:45  . NASAL SEPTUM SURGERY    . TONSILLECTOMY        Physical Exam:  There were no vitals taken for this visit. Affect appropriate Healthy:  appears stated age 29: normal Neck supple with no  adenopathy JVP normal no bruits no thyromegaly Lungs clear with no wheezing and good diaphragmatic motion Heart:  S1/S2 no murmur, no rub, gallop or click PMI normal Abdomen: benighn, BS positve, no tenderness, no AAA no bruit.  No HSM or HJR Distal pulses intact with no bruits Bilateral edema Neuro non-focal Skin warm and dry No muscular weakness    Labs:   Lab Results  Component Value Date   WBC 7.7 09/18/2020   HGB 9.8 (L) 09/18/2020   HCT 31.0 (L) 09/18/2020   MCV 94.2 09/18/2020   PLT 311 09/18/2020    Recent Labs  Lab 09/18/20 1737 09/19/20 0415  NA 129* 131*  K 4.9 4.8  CL 91* 93*  CO2 27 30  BUN 33* 35*  CREATININE 1.92* 1.80*  CALCIUM 8.3* 8.6*  PROT 6.5  --   BILITOT 0.9  --   ALKPHOS 101  --   ALT 52*  --   AST 71*  --   GLUCOSE 126* 110*   Lab Results  Component Value Date   TROPONINI <0.30 01/09/2013    Lab Results  Component Value Date   CHOL 135 03/24/2019   Lab Results  Component Value Date   HDL 52 03/24/2019   Lab Results  Component Value Date   LDLCALC 75 03/24/2019   Lab Results  Component Value Date   TRIG 87 09/16/2020   TRIG 41 03/24/2019   Lab Results  Component Value Date   CHOLHDL 2.6 03/24/2019   No results found for: LDLDIRECT    Radiology: [facility] Chest Port 1 View  Result Date: 09/17/2020 CLINICAL DATA:  87 year old male with bradycardia, shortness of breath status post COVID-19 three weeks ago. Atrial fibrillation. EXAM: PORTABLE CHEST 1 VIEW COMPARISON:  Portable chest 09/16/2020 and earlier. FINDINGS: Portable AP semi upright view at 0703 hours. Improved lung volumes. Mild cardiomegaly is stable. Other mediastinal contours are within normal limits. Visualized tracheal air column is within normal limits. Veiling bilateral lower lung opacity, dense retrocardiac opacity. But bilateral pulmonary vascularity appears decreased, more normal. No pneumothorax. No air bronchograms. No acute osseous abnormality identified.  IMPRESSION: 1. Small to moderate bilateral pleural effusions with lower lobe collapse or consolidation. 2. Normalized pulmonary vascularity since yesterday. Electronically Signed   By: Genevie Ann M.D.   On: 09/17/2020 07:12   DG Chest Portable 1 View  Result Date: 09/16/2020 CLINICAL DATA:  Short of breath, bradycardia EXAM: PORTABLE CHEST 1 VIEW COMPARISON:  03/24/2019 FINDINGS: Stable large cardiac silhouette. Bilateral small pleural effusions. Mild central venous congestion. No focal infiltrate. No overt pulmonary edema. No pneumothorax. IMPRESSION: Small bilateral pleural effusions and central venous congestion. Electronically Signed   By: Suzy Bouchard M.D.   On: 09/16/2020 12:24   VAS [place] LOWER EXTREMITY VENOUS (DVT)  Result Date: 09/17/2020  Lower Venous DVT Study Indications: Edema.  Comparison Study: No previous exams. Performing Technologist: Rogelia Rohrer  Examination Guidelines: A complete evaluation includes B-mode imaging, spectral Doppler, color Doppler, and power Doppler as needed of all accessible portions of each vessel. Bilateral testing is considered an integral part of a complete examination. Limited examinations for reoccurring indications may be performed as noted. The reflux portion of the exam is performed with the patient in reverse Trendelenburg.  +---------+---------------+---------+-----------+----------+--------------+ RIGHT    CompressibilityPhasicitySpontaneityPropertiesThrombus Aging +---------+---------------+---------+-----------+----------+--------------+ CFV      Full           Yes      Yes                                 +---------+---------------+---------+-----------+----------+--------------+ SFJ      Full                                                        +---------+---------------+---------+-----------+----------+--------------+ FV Prox  Full           Yes      Yes                                  +---------+---------------+---------+-----------+----------+--------------+ FV Mid   Full           Yes      Yes                                 +---------+---------------+---------+-----------+----------+--------------+ FV DistalFull           Yes      Yes                                 +---------+---------------+---------+-----------+----------+--------------+ PFV      Full                                                        +---------+---------------+---------+-----------+----------+--------------+ POP      Full           Yes      Yes                                 +---------+---------------+---------+-----------+----------+--------------+ PTV      Full                                                        +---------+---------------+---------+-----------+----------+--------------+ PERO     Full                                                        +---------+---------------+---------+-----------+----------+--------------+   +---------+---------------+---------+-----------+----------+--------------+  LEFT     CompressibilityPhasicitySpontaneityPropertiesThrombus Aging +---------+---------------+---------+-----------+----------+--------------+ CFV      Full           Yes      Yes                                 +---------+---------------+---------+-----------+----------+--------------+ SFJ      Full                                                        +---------+---------------+---------+-----------+----------+--------------+ FV Prox  Full           Yes      Yes                                 +---------+---------------+---------+-----------+----------+--------------+ FV Mid   Full           Yes      Yes                                 +---------+---------------+---------+-----------+----------+--------------+ FV DistalFull           Yes      Yes                                  +---------+---------------+---------+-----------+----------+--------------+ PFV      Full                                                        +---------+---------------+---------+-----------+----------+--------------+ POP      Full           Yes      Yes                                 +---------+---------------+---------+-----------+----------+--------------+ PTV      Full                                                        +---------+---------------+---------+-----------+----------+--------------+ PERO     Full                                                        +---------+---------------+---------+-----------+----------+--------------+     Summary: BILATERAL: - No evidence of deep vein thrombosis seen in the lower extremities, bilaterally. - No evidence of superficial venous thrombosis in the lower extremities, bilaterally. -No evidence of popliteal cyst, bilaterally.   *See table(s) above for measurements and observations. Electronically signed by Servando Snare MD on 09/17/2020 at 1:04:38 PM.    Final  EKG: SR PaC/PVC no afib ECG 05/14/19 afib 09/23/2020 afib RBBB rate 66    ASSESSMENT AND PLAN:   1. AFib:  Chronic on eliquis Cardizem d/c due to bradycardia ***  2. Systolic/ Diastolic CHF:  Normal EF by echo mild LVH and indeterminate diastolic evalvuation no significant valve disease Now on toresemide 40 bid Still with LE edema Not clear that some of this is from LE venous disease and appears more chronic with stasis changes EF 45%    3. Chest Pain:  No history of CAD Normal cath in 2002. Echo with no RWMAls Myovue non ischemic 06/12/19 observe appears to be non cardiac   4. Delirium  Discussed ETOH use and gabapentin ***  F/U in 6 months   Signed: Jenkins Rouge 09/23/2020, 11:38 AM

## 2020-09-23 NOTE — ED Triage Notes (Signed)
Pt arrives via [facility] from home. Family called EMS for weakness, then tells EMS that they are concerned that pt is having AMS. Pt recently admitted at [facility] for CHF.

## 2020-09-24 ENCOUNTER — Encounter (HOSPITAL_COMMUNITY): Payer: Self-pay | Admitting: Family Medicine

## 2020-09-24 ENCOUNTER — Inpatient Hospital Stay (HOSPITAL_COMMUNITY): Payer: Medicare HMO

## 2020-09-24 ENCOUNTER — Encounter (HOSPITAL_COMMUNITY): Payer: Self-pay | Admitting: Anesthesiology

## 2020-09-24 DIAGNOSIS — I5042 Chronic combined systolic (congestive) and diastolic (congestive) heart failure: Secondary | ICD-10-CM | POA: Diagnosis present

## 2020-09-24 DIAGNOSIS — Z20822 Contact with and (suspected) exposure to covid-19: Secondary | ICD-10-CM | POA: Diagnosis present

## 2020-09-24 DIAGNOSIS — F0390 Unspecified dementia without behavioral disturbance: Secondary | ICD-10-CM | POA: Diagnosis present

## 2020-09-24 DIAGNOSIS — D62 Acute posthemorrhagic anemia: Secondary | ICD-10-CM | POA: Diagnosis present

## 2020-09-24 DIAGNOSIS — I429 Cardiomyopathy, unspecified: Secondary | ICD-10-CM | POA: Diagnosis present

## 2020-09-24 DIAGNOSIS — K267 Chronic duodenal ulcer without hemorrhage or perforation: Secondary | ICD-10-CM | POA: Diagnosis present

## 2020-09-24 DIAGNOSIS — I509 Heart failure, unspecified: Secondary | ICD-10-CM | POA: Diagnosis not present

## 2020-09-24 DIAGNOSIS — I493 Ventricular premature depolarization: Secondary | ICD-10-CM | POA: Diagnosis present

## 2020-09-24 DIAGNOSIS — K449 Diaphragmatic hernia without obstruction or gangrene: Secondary | ICD-10-CM | POA: Diagnosis not present

## 2020-09-24 DIAGNOSIS — N183 Chronic kidney disease, stage 3 unspecified: Secondary | ICD-10-CM | POA: Diagnosis present

## 2020-09-24 DIAGNOSIS — I361 Nonrheumatic tricuspid (valve) insufficiency: Secondary | ICD-10-CM | POA: Diagnosis not present

## 2020-09-24 DIAGNOSIS — H409 Unspecified glaucoma: Secondary | ICD-10-CM | POA: Diagnosis present

## 2020-09-24 DIAGNOSIS — H3530 Unspecified macular degeneration: Secondary | ICD-10-CM | POA: Diagnosis present

## 2020-09-24 DIAGNOSIS — I340 Nonrheumatic mitral (valve) insufficiency: Secondary | ICD-10-CM

## 2020-09-24 DIAGNOSIS — I482 Chronic atrial fibrillation, unspecified: Secondary | ICD-10-CM | POA: Diagnosis not present

## 2020-09-24 DIAGNOSIS — I5032 Chronic diastolic (congestive) heart failure: Secondary | ICD-10-CM | POA: Diagnosis not present

## 2020-09-24 DIAGNOSIS — I248 Other forms of acute ischemic heart disease: Secondary | ICD-10-CM | POA: Diagnosis present

## 2020-09-24 DIAGNOSIS — R1319 Other dysphagia: Secondary | ICD-10-CM | POA: Diagnosis present

## 2020-09-24 DIAGNOSIS — K2980 Duodenitis without bleeding: Secondary | ICD-10-CM | POA: Diagnosis present

## 2020-09-24 DIAGNOSIS — N1832 Chronic kidney disease, stage 3b: Secondary | ICD-10-CM | POA: Diagnosis present

## 2020-09-24 DIAGNOSIS — I10 Essential (primary) hypertension: Secondary | ICD-10-CM

## 2020-09-24 DIAGNOSIS — R1310 Dysphagia, unspecified: Secondary | ICD-10-CM | POA: Diagnosis not present

## 2020-09-24 DIAGNOSIS — E8809 Other disorders of plasma-protein metabolism, not elsewhere classified: Secondary | ICD-10-CM

## 2020-09-24 DIAGNOSIS — K921 Melena: Secondary | ICD-10-CM | POA: Diagnosis present

## 2020-09-24 DIAGNOSIS — I4819 Other persistent atrial fibrillation: Secondary | ICD-10-CM | POA: Diagnosis present

## 2020-09-24 DIAGNOSIS — I4891 Unspecified atrial fibrillation: Secondary | ICD-10-CM

## 2020-09-24 DIAGNOSIS — Z85828 Personal history of other malignant neoplasm of skin: Secondary | ICD-10-CM | POA: Diagnosis not present

## 2020-09-24 DIAGNOSIS — K2270 Barrett's esophagus without dysplasia: Secondary | ICD-10-CM | POA: Diagnosis present

## 2020-09-24 DIAGNOSIS — R778 Other specified abnormalities of plasma proteins: Secondary | ICD-10-CM | POA: Diagnosis not present

## 2020-09-24 DIAGNOSIS — R531 Weakness: Secondary | ICD-10-CM | POA: Diagnosis not present

## 2020-09-24 DIAGNOSIS — Z7901 Long term (current) use of anticoagulants: Secondary | ICD-10-CM | POA: Diagnosis not present

## 2020-09-24 DIAGNOSIS — I428 Other cardiomyopathies: Secondary | ICD-10-CM

## 2020-09-24 DIAGNOSIS — Z7189 Other specified counseling: Secondary | ICD-10-CM

## 2020-09-24 DIAGNOSIS — K222 Esophageal obstruction: Secondary | ICD-10-CM | POA: Diagnosis not present

## 2020-09-24 DIAGNOSIS — Z8616 Personal history of COVID-19: Secondary | ICD-10-CM | POA: Diagnosis not present

## 2020-09-24 DIAGNOSIS — Z66 Do not resuscitate: Secondary | ICD-10-CM | POA: Diagnosis present

## 2020-09-24 DIAGNOSIS — I130 Hypertensive heart and chronic kidney disease with heart failure and stage 1 through stage 4 chronic kidney disease, or unspecified chronic kidney disease: Secondary | ICD-10-CM | POA: Diagnosis present

## 2020-09-24 LAB — COMPREHENSIVE METABOLIC PANEL
ALT: 27 U/L (ref 0–44)
AST: 23 U/L (ref 15–41)
Albumin: 2.8 g/dL — ABNORMAL LOW (ref 3.5–5.0)
Alkaline Phosphatase: 51 U/L (ref 38–126)
Anion gap: 11 (ref 5–15)
BUN: 61 mg/dL — ABNORMAL HIGH (ref 8–23)
CO2: 34 mmol/L — ABNORMAL HIGH (ref 22–32)
Calcium: 8.8 mg/dL — ABNORMAL LOW (ref 8.9–10.3)
Chloride: 95 mmol/L — ABNORMAL LOW (ref 98–111)
Creatinine, Ser: 1.52 mg/dL — ABNORMAL HIGH (ref 0.61–1.24)
GFR, Estimated: 43 mL/min — ABNORMAL LOW (ref >60)
Glucose, Bld: 99 mg/dL (ref 70–99)
Potassium: 3.9 mmol/L (ref 3.5–5.1)
Sodium: 140 mmol/L (ref 135–145)
Total Bilirubin: 0.9 mg/dL (ref 0.3–1.2)
Total Protein: 5.5 g/dL — ABNORMAL LOW (ref 6.5–8.1)

## 2020-09-24 LAB — CBC
HCT: 23.8 % — ABNORMAL LOW (ref 39.0–52.0)
Hemoglobin: 7.4 g/dL — ABNORMAL LOW (ref 13.0–17.0)
MCH: 28.9 pg (ref 26.0–34.0)
MCHC: 31.1 g/dL (ref 30.0–36.0)
MCV: 93 fL (ref 80.0–100.0)
Platelets: 218 10*3/uL (ref 150–400)
RBC: 2.56 MIL/uL — ABNORMAL LOW (ref 4.22–5.81)
RDW: 15.9 % — ABNORMAL HIGH (ref 11.5–15.5)
WBC: 8.1 10*3/uL (ref 4.0–10.5)
nRBC: 0.4 % — ABNORMAL HIGH (ref 0.0–0.2)

## 2020-09-24 LAB — PREPARE RBC (CROSSMATCH)

## 2020-09-24 LAB — ABO/RH: ABO/RH(D): O POS

## 2020-09-24 LAB — HEMOGLOBIN AND HEMATOCRIT, BLOOD
HCT: 20.9 % — ABNORMAL LOW (ref 39.0–52.0)
Hemoglobin: 6.4 g/dL — CL (ref 13.0–17.0)

## 2020-09-24 LAB — SARS CORONAVIRUS 2 (TAT 6-24 HRS): SARS Coronavirus 2: NEGATIVE

## 2020-09-24 LAB — ECHOCARDIOGRAM LIMITED
Area-P 1/2: 3.21 cm2
Height: 69 in
S' Lateral: 3.53 cm
Weight: 2793.67 oz

## 2020-09-24 MED ORDER — LEVOTHYROXINE SODIUM 25 MCG PO TABS
25.0000 ug | ORAL_TABLET | Freq: Every day | ORAL | Status: DC
  Filled 2020-09-24: qty 1

## 2020-09-24 MED ORDER — PERFLUTREN LIPID MICROSPHERE
1.0000 mL | INTRAVENOUS | Status: AC | PRN
Start: 2020-09-24 — End: 2020-09-24
  Administered 2020-09-24: 1 mL via INTRAVENOUS

## 2020-09-24 MED ORDER — LATANOPROST 0.005 % OP SOLN
1.0000 [drp] | Freq: Every day | OPHTHALMIC | Status: DC
  Administered 2020-09-24 – 2020-09-25 (×2): 1 [drp] via OPHTHALMIC
  Filled 2020-09-24: qty 2.5

## 2020-09-24 MED ORDER — FUROSEMIDE 10 MG/ML IJ SOLN
20.0000 mg | Freq: Once | INTRAMUSCULAR | Status: AC
  Administered 2020-09-24: 20 mg via INTRAVENOUS
  Filled 2020-09-24: qty 2

## 2020-09-24 MED ORDER — SODIUM CHLORIDE 0.9% IV SOLUTION: Freq: Once | INTRAVENOUS | Status: DC

## 2020-09-24 MED ORDER — HYDROCORTISONE ACETATE 25 MG RE SUPP
25.0000 mg | Freq: Two times a day (BID) | RECTAL | Status: DC
  Administered 2020-09-24 – 2020-09-25 (×4): 25 mg via RECTAL
  Filled 2020-09-24 (×5): qty 1

## 2020-09-24 MED ORDER — QUETIAPINE FUMARATE 25 MG PO TABS
25.0000 mg | ORAL_TABLET | Freq: Once | ORAL | Status: AC
  Administered 2020-09-24: 25 mg via ORAL
  Filled 2020-09-24: qty 1

## 2020-09-24 MED ORDER — ALBUTEROL SULFATE HFA 108 (90 BASE) MCG/ACT IN AERS: 2.0000 | INHALATION_SPRAY | RESPIRATORY_TRACT | Status: DC | PRN

## 2020-09-24 MED ORDER — QUETIAPINE FUMARATE 25 MG PO TABS
25.0000 mg | ORAL_TABLET | Freq: Every day | ORAL | Status: DC
  Administered 2020-09-24 – 2020-09-25 (×2): 25 mg via ORAL
  Filled 2020-09-24 (×2): qty 1

## 2020-09-24 MED ORDER — HALOPERIDOL LACTATE 5 MG/ML IJ SOLN
2.5000 mg | Freq: Once | INTRAMUSCULAR | Status: DC
  Filled 2020-09-24: qty 1

## 2020-09-24 MED ORDER — SODIUM CHLORIDE 0.9 % IV SOLN: INTRAVENOUS | Status: DC

## 2020-09-24 MED ORDER — DIPHENHYDRAMINE HCL 25 MG PO CAPS: 25.0000 mg | ORAL_CAPSULE | Freq: Once | ORAL | Status: DC

## 2020-09-24 MED ORDER — ALPRAZOLAM 0.5 MG PO TABS: 0.5000 mg | ORAL_TABLET | Freq: Two times a day (BID) | ORAL | Status: DC | PRN

## 2020-09-24 MED ORDER — ACETAMINOPHEN 325 MG PO TABS: 650.0000 mg | ORAL_TABLET | Freq: Once | ORAL | Status: DC

## 2020-09-24 NOTE — Consult Note (Signed)
Consultation Note Date: 09/24/2020   Patient Name: David Acosta  DOB: [birth year]  MRN: [id number]  Age / Sex: 87 y.o., male  PCP: Christain Sacramento, MD Referring Physician: Murlean Iba, MD  Reason for Consultation: Establishing goals of care  HPI/Patient Profile: 87 y.o. 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 intake, lightheaded altered mental status. Work-up has revealed GI bleed with anemia down to 6.4. Patient was scheduled for a palliative evaluation as an outpatient however was readmitted before that could be completed. Palliative now consulted for goals of care.  Clinical Assessment and Goals of Care: Chart reviewed. Evaluated patient. Upon my entrance he was talking to someone who was not present in the room. At first he did not respond to my voice. However after repeated attempts, he acknowledged my presence. He denied feeling any pain. He could not tell me where he was or why he was in the hospital. Without any questions he stated "I am ready to check out ". I explored the statement with him and he further elaborated "I am ready to check out of life ". I asked him if that meant he was ready to just be comfortable and experience dying and end-of-life, and he confirmed that that was what he meant. He then stated "ground control to [place]" and therefore I played Vilinda Blanks song "Space Oddity" for him which contains those lyrics- much to patient's obvious delight. 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 After spending time with the patient, I called his daughter [name]. I reviewed my discussion that I had had with him and relayed to her that I feel like he was telling me he is ready to die. [name] readily agreed and stated he had been having that conversation for the last week and had made statements to his family that they were trying to keep him from dying and that he was not happy with that. I reviewed with [name] the fact of his recent hospitalization, his decreased oral intake, and the need for rehospitalization indicates that he likely is at end-of-life and would be eligible for hospice care. We discussed what a transition to full comfort and hospice care would look like. Patient would be eligible likely for either placement in the hospice home or to receive hospice services in his own home. [name] is in agreement that hospice is a appropriate choice for patient. We discussed that he is scheduled for the endoscopy tomorrow morning-[name] would like to discuss with her family the options of transition to full comfort care and not doing the endoscopy. She would like to hold off on endoscopy for now and will let providers know tomorrow morning if family would like to proceed with endoscopy. We also discussed his  blood transfusion and are in agreement that a transfusion is likely to increase his comfort for now.  Primary Decision Maker NEXT OF KIN- daughter [name] and son Rudi, [place]    SUMMARY OF RECOMMENDATIONS  -DNR -Family to discuss hospice options (home vs inpatient hospice house)  -Hold scheduled EGD until family decision tomorrow morning- I have notified GI and attending of this   Code Status/Advance Care Planning:  DNR  Prognosis:    < 6 weeks  Discharge Planning: To Be Determined  Primary Diagnoses: Present on Admission: . Melena . HTN (hypertension) . Unspecified atrial fibrillation ([place]) . Hypoalbuminemia . Acute blood loss anemia . Glaucoma . Chronic diastolic heart failure ([place]) . Elevated troponin . Chronic renal disease,  stage 3b ([place])   I have reviewed the medical record, interviewed the patient and family, and examined the patient. The following aspects are pertinent.  Past Medical History:  Diagnosis Date  . Atrial fibrillation ([place])   . Cancer (HCC)    Skin  . Dysphagia 10/05/2017  . Glaucoma   . Heart failure ([place])   . Hypertension   . Macular degeneration   . Premature ventricular contraction 2013   Social History   Socioeconomic History  . Marital status: Married    Spouse name: Not on file  . Number of children: Not on file  . Years of education: Not on file  . Highest education level: Not on file  Occupational History  . Not on file  Tobacco Use  . Smoking status: Former Smoker    Quit date: 01/23/1959    Years since quitting: 61.7  . Smokeless tobacco: Never Used  Vaping Use  . Vaping Use: Never used  Substance and Sexual Activity  . Alcohol use: Yes    Comment: daily, 2.7 oz   . Drug use: No  . Sexual activity: Not on file  Other Topics Concern  . Not on file  Social History Narrative  . Not on file   Social Determinants of Health   Financial Resource Strain: Not on file  Food Insecurity: Not on file  Transportation Needs: Not on file  Physical Activity: Not on file  Stress: Not on file  Social Connections: Not on file   Scheduled Meds: . sodium chloride   Intravenous Once  . hydrocortisone  25 mg Rectal BID  . latanoprost  1 drop Both Eyes QHS  . levothyroxine  25 mcg Oral Q0600  . pantoprazole (PROTONIX) IV  40 mg Intravenous Q12H  . QUEtiapine  25 mg Oral QHS   Continuous Infusions: . sodium chloride     PRN Meds:.acetaminophen **OR** acetaminophen, albuterol, ALPRAZolam, ondansetron **OR** ondansetron (ZOFRAN) IV Medications Prior to Admission:  Prior to Admission medications   Medication Sig Start Date End Date Taking? Authorizing Provider  albuterol (VENTOLIN HFA) 108 (90 Base) MCG/ACT inhaler Inhale 2 puffs into the lungs every 4 (four) hours as  needed for wheezing. 09/03/20  Yes [provider]  ALPRAZolam Duanne Moron) 0.5 MG tablet Take 0.5 mg by mouth 2 (two) times daily as needed for anxiety. 02/18/19  Yes [provider]  apixaban (ELIQUIS) 2.5 MG TABS tablet Take 1 tablet (2.5 mg total) by mouth 2 (two) times daily. 04/08/20  Yes Josue Hector, MD  ferrous sulfate 325 (65 FE) MG tablet Take 325 mg by mouth daily with breakfast.   Yes [provider]  hydrocortisone (ANUSOL-HC) 25 MG suppository Place 25 mg rectally 2 (two) times daily. 08/18/20  Yes [provider]  latanoprost (XALATAN) 0.005 % ophthalmic solution Place 1 drop into both eyes at bedtime.   Yes [provider]  levothyroxine (SYNTHROID) 25 MCG tablet Take 25 mcg by mouth daily. 08/21/20  Yes [provider]  Multiple Vitamins-Minerals (PRESERVISION AREDS 2) CHEW Chew 1 each by mouth daily.   Yes [provider]  polyethylene glycol (MIRALAX / GLYCOLAX) 17 g packet Take 17 g by mouth daily.   Yes [provider]  QUEtiapine (SEROQUEL) 25 MG tablet Take 25 mg by mouth at bedtime. 09/22/20  Yes [provider]  torsemide (DEMADEX) 20 MG tablet Take 2 tablets (40 mg total) by mouth daily. 09/19/20  Yes Regalado, Cassie Freer, MD   Allergies  Allergen Reactions  . Capsaicin Other (See Comments)    "heart started to race and blood pressure went up"  . Fluticasone Other (See Comments)    glaucoma  . Procardia Xl [Nifedipine] Other (See Comments)    Rapid heart beat  . Timolol Swelling   Review of Systems  Physical Exam Vitals and nursing note reviewed.  Constitutional:      Appearance: He is ill-appearing.  Cardiovascular:     Rate and Rhythm: Rhythm irregular.     Pulses: Normal pulses.  Pulmonary:     Effort: Pulmonary effort is normal.  Neurological:     Comments: Awake, but lethargic, in and out of worlds, does not make eye contact  Psychiatric:     Comments: pleasant     Vital Signs:  BP (!) 102/56 (BP Location: Left Arm)   Pulse 95   Temp 98.2 F (36.8 C) (Oral)   Resp 20   Ht 5\' 9"  (1.753 m)   Wt 79.2 kg   SpO2 93%   BMI 25.78 kg/m  Pain Scale: PAINAD   Pain Score: 0-No pain   SpO2: SpO2: 93 % O2 Device:SpO2: 93 % O2 Flow Rate: .O2 Flow Rate (L/min): 3 L/min  IO: Intake/output summary:   Intake/Output Summary (Last 24 hours) at 09/24/2020 1651 Last data filed at 09/24/2020 1300 Gross per 24 hour  Intake 240 ml  Output --  Net 240 ml    LBM:   Baseline Weight: Weight: 79.2 kg Most recent weight: Weight: 79.2 kg     Palliative Assessment/Data: PPS: 20%      Thank you for this consult. Palliative medicine will continue to follow and assist as needed.   Time In:1615  Time Out: 1742 Time Total: 87 minutes Greater than 50%  of this time was spent counseling and coordinating care related to the above assessment and plan.  Signed by: Mariana Kaufman, AGNP-C Palliative Medicine    Please contact Palliative Medicine Team phone at [phone] for questions and concerns.  For individual provider: See Shea Evans

## 2020-09-24 NOTE — Progress Notes (Signed)
Into room for bedside report from off-going nurse. Pt awake, oriented to person only. Restless, gown is off and pt will not allow staff to put it back on. Pt states, "Just give me the papers and I'll sign 'em. There are legal rights here you know. I'm already dead so what does it matter?". Pt not responding appropriately to questions asked, just rambling conversation. Unable to reorient. Pt not combative, but pulls away when staff attempts to touch him. Ordered Haldol not given at this time due to pt restless and confused but not combative. O2 off and pt refuses to have it put back on as well. No resp difficulty noted, breathing even and non-labored. Purewick is still on draining clear yellow urine. Bed alarm is on for safety. Pt remains on NPO status.

## 2020-09-24 NOTE — Progress Notes (Signed)
Spoke with anesthesia, Dr. Charna Elizabeth. In preparation for anesthesia/EGD tomorrow, he is recommending considering IV fluids and one unit of prbcs. Discussed with Dr. Wynetta Emery and daughter, [name].   Will update H/H now.  IV NS at 60cc/hr. Likely provide one unit prbcs this afternoon. Patient has not had BM since on the floor.  Laureen Ochs. Bernarda Caffey [facility] [phone] 2/17/20222:55 PM

## 2020-09-24 NOTE — Progress Notes (Signed)
UNMATCHED BLOOD PRODUCT NOTE  Compare the patient ID on the blood tag to the patient ID on the hospital armband and Blood Bank armband. Then confirm the unit number on the blood tag matches the unit number on the blood product.  If a discrepancy is discovered return the product to blood bank immediately.   Blood Product Type: Packed Red Blood Cells  Unit #: [id number]   Product Code #: [id number]   Start Time: 2100  Starting Rate: 120 ml/hr  Rate increase/decreased  (if applicable):      672 ml/hr  Rate changed time (if applicable): 8979    Stop Time: 2308   All Other Documentation should be documented within the Blood Admin Flowsheet per policy.

## 2020-09-24 NOTE — Consult Note (Addendum)
Referring Provider: Murlean Iba, MD Primary Care Physician:  Christain Sacramento, MD Primary Gastroenterologist:  Hildred Laser, MD  Reason for Consultation:  melena  HPI: David Acosta is a 87 y.o. male macular degeneration, hard of hearing, chronic kidney disease stage IIIb, hypothyroidism, A. fib on Eliquis, heart failure with recent admission complicated by delirium (discharged February 12) presenting via EMS for progressive weakness, diminished appetite, and mental status changes.  Patient had Covid back in January, tested positive January 26, did not require hospitalization or monoclonal antibody infusion.  In the ED, vital signs were stable.  ED provider Dr.Steinl describe melanotic stools on exam.  White blood cell count 9.3, hemoglobin 8 (down from 9.8 six days prior), platelets 212,000.  INR 1.8.  BUN 57, creatinine 1.61.  Troponin 117 and then 112.  LFTs normal except for albumin of 2.9. CT head without contrast 09/23/2020: Age-appropriate cerebral atrophy.  No acute findings.  Today hemoglobin 7.4, platelets 218,000, BUN 61, creatinine 1.52.  Outpatient labs dated 09/09/2020: Iron 18, ferritin 18, iron sats 5%, TIBC 399, hemoglobin 9.1, hematocrit 28.4, MCV 93.5.  Hemoglobin from October 2020 was 12.4.  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  Patient recently had some confusion with recent hospitalization and Neurontin use.  Started on Seroquel couple of days ago by PCP.  Per David Acosta, patient went into the hospital last week alert and oriented.  Became confused while inpatient and has had significant confusion since discharge.  In fact had to be transported via EMS because he was unable to walk with his delirium.  At home he has been on a "hunger strike" stating that he wanted to kill himself through starvation.  Very minimal oral intake.  He has had episodes of outburst, inappropriate behavior with throwing items in the house.  Episodes of  seeming oriented as well.  Behavior declined since first of the year when Covid went to the house, wife potentially needing skilled nursing facility placement.  Saw PCP in follow-up this week and started on Seroquel.  Advised to remove all knives and guns from the house and monitor patient's behavior for decline.  Of note, patient has a history of consuming 3 mixed drinks in the evenings consisting of vodka and orange juice chronically.  Historically has always had wine or alcoholic beverages in the evening for years, did not seem to be excessive.  Since hospitalized on February 9, he has had no alcohol.  When he became agitated and throwing items at the house last night, they decided to activate EMS.    Early this morning he threw the Holter monitor at the tech, took off his gown.  He was provided Haldol.  On exam, this morning he arouses easily.  Currently alert and oriented x3.  He denies any abdominal pain.  States he felt like he had some indigestion last night prior to coming to the hospital.  Appetite has been poor.  Complains of difficulty swallowing for quite some time.  States he feels like he has a tumor in his esophagus.  Denies vomiting.  He denies blood in the stool.  States his bowel movements have been fine.  David Acosta reports that he has been having some rectal bleeding off and on for greater than 6 months but worse the last couple of months.  On rectal exam by PCP back in January, "lump on the left inner anus around 8:00 region, possibly a hemorrhoid but currently not bleeding" was noted.  Hemorrhoid ointments have not helped. Scheduled to see surgery in the near future.  No NSAID or aspirin use.  Last dose of Eliquis at 6 PM last night.  Patient has a history of EGD in April 2019, findings consistent with Barrett's.  Biopsy with indefinite dysplasia.  Advised 8-week follow-up EGD with subsequent biopsies but this was not completed.   Prior pertinent work-up:  CTA abdomen pelvis August 2020:  Normal hepatic contours and density.  Cholelithiasis seen.  Spleen normal in size.  SMA widely patent.  IMA narrowing at the origin.  Celiac normal.  EGD 11/2017 Normal proximal esophagus and mid esophagus. - Salmon-colored mucosa suspicious for short-segment Barrett's esophagus. Biopsied c/w Barrett's with indefinite dysplasia (recommended egd with repeat bx in 8 wks - not performed) - Benign-appearing highgrade esophageal stenosis at GEJ. Dilated. - 3 cm hiatal hernia. - Normal stomach. - Normal duodenal bulb and second portion of the duodenum.   Prior to Admission medications   Medication Sig Start Date End Date Taking? Authorizing Provider  albuterol (VENTOLIN HFA) 108 (90 Base) MCG/ACT inhaler Inhale 2 puffs into the lungs every 4 (four) hours as needed for wheezing. 09/03/20  Yes [provider]  ALPRAZolam Duanne Moron) 0.5 MG tablet Take 0.5 mg by mouth 2 (two) times daily as needed for anxiety. 02/18/19  Yes [provider]  apixaban (ELIQUIS) 2.5 MG TABS tablet Take 1 tablet (2.5 mg total) by mouth 2 (two) times daily. 04/08/20  Yes Josue Hector, MD  ferrous sulfate 325 (65 FE) MG tablet Take 325 mg by mouth daily with breakfast.   Yes [provider]  hydrocortisone (ANUSOL-HC) 25 MG suppository Place 25 mg rectally 2 (two) times daily. 08/18/20  Yes [provider]  latanoprost (XALATAN) 0.005 % ophthalmic solution Place 1 drop into both eyes at bedtime.   Yes [provider]  levothyroxine (SYNTHROID) 25 MCG tablet Take 25 mcg by mouth daily. 08/21/20  Yes [provider]  Multiple Vitamins-Minerals (PRESERVISION AREDS 2) CHEW Chew 1 each by mouth daily.   Yes [provider]  polyethylene glycol (MIRALAX / GLYCOLAX) 17 g packet Take 17 g by mouth daily.   Yes [provider]  QUEtiapine (SEROQUEL) 25 MG tablet Take 25 mg by mouth at bedtime. 09/22/20  Yes [provider]  torsemide (DEMADEX) 20 MG tablet Take 2  tablets (40 mg total) by mouth daily. 09/19/20  Yes Regalado, Cassie Freer, MD    Current Facility-Administered Medications  Medication Dose Route Frequency Provider Last Rate Last Admin  . acetaminophen (TYLENOL) tablet 650 mg  650 mg Oral Q6H PRN Reubin Milan, MD       Or  . acetaminophen (TYLENOL) suppository 650 mg  650 mg Rectal Q6H PRN Reubin Milan, MD      . albuterol (VENTOLIN HFA) 108 (90 Base) MCG/ACT inhaler 2 puff  2 puff Inhalation Q4H PRN Reubin Milan, MD      . ALPRAZolam Duanne Moron) tablet 0.5 mg  0.5 mg Oral BID PRN Reubin Milan, MD      . haloperidol lactate (HALDOL) injection 2.5 mg  2.5 mg Intravenous Once Reubin Milan, MD      . hydrocortisone (ANUSOL-HC) suppository 25 mg  25 mg Rectal BID Reubin Milan, MD      . latanoprost (XALATAN) 0.005 % ophthalmic solution 1 drop  1 drop Both Eyes QHS Reubin Milan, MD      . levothyroxine (SYNTHROID) tablet 25 mcg  25 mcg Oral Q0600 Reubin Milan, MD      . ondansetron [facility]) tablet 4 mg  4 mg Oral Q6H PRN Reubin Milan, MD       Or  . ondansetron [facility]) injection 4 mg  4 mg Intravenous Q6H PRN Reubin Milan, MD      . pantoprazole (PROTONIX) injection 40 mg  40 mg Intravenous Q12H Reubin Milan, MD   40 mg at 09/24/20 0401  . QUEtiapine (SEROQUEL) tablet 25 mg  25 mg Oral QHS Reubin Milan, MD        Allergies as of 09/23/2020 - Review Complete 09/23/2020  Allergen Reaction Noted  . Capsaicin Other (See Comments) 01/09/2013  . Fluticasone Other (See Comments) 09/29/2015  . Procardia xl [nifedipine] Other (See Comments) 01/09/2013  . Timolol Swelling 01/22/2014    Past Medical History:  Diagnosis Date  . Atrial fibrillation ([place])   . Cancer ([facility])    Skin  . Dysphagia 10/05/2017  . Glaucoma   . Heart failure ([place])   . Hypertension   . Macular degeneration   . Premature ventricular contraction 2013    Past Surgical History:  Procedure  Laterality Date  . CATARACT EXTRACTION W/PHACO Left 01/28/2014   Procedure: CATARACT EXTRACTION PHACO AND INTRAOCULAR LENS PLACEMENT (IOC);  Surgeon: Elta Guadeloupe T. Gershon Crane, MD;  Location: [facility];  Service: Ophthalmology;  Laterality: Left;  CDE 16.74  . CATARACT EXTRACTION W/PHACO Right 02/18/2014   Procedure: CATARACT EXTRACTION PHACO AND INTRAOCULAR LENS PLACEMENT (IOC);  Surgeon: Elta Guadeloupe T. Gershon Crane, MD;  Location: [facility];  Service: Ophthalmology;  Laterality: Right;  CDE 9.15  . cholesteoma mastoidectomy Right 2012  . Dupytrens contraction Right 2010  . ESOPHAGEAL DILATION N/A 11/23/2017   Procedure: ESOPHAGEAL DILATION;  Surgeon: Rogene Houston, MD;  Location: [facility] SUITE;  Service: Endoscopy;  Laterality: N/A;  . ESOPHAGOGASTRODUODENOSCOPY N/A 11/23/2017   Rehman: Barrett's esophagus with indefinite dysplasia, high grade esoph stenosis at GEJ s/p dilation, 3 cm hh  . NASAL SEPTUM SURGERY    . TONSILLECTOMY      Family History  Problem Relation Age of Onset  . Leukemia Mother   . Heart attack Father   . Heart failure Sister   . Heart attack Brother   . Uterine cancer Paternal Grandfather     Social History   Socioeconomic History  . Marital status: Married    Spouse name: Not on file  . Number of children: Not on file  . Years of education: Not on file  . Highest education level: Not on file  Occupational History  . Not on file  Tobacco Use  . Smoking status: Former Smoker    Quit date: 01/23/1959    Years since quitting: 61.7  . Smokeless tobacco: Never Used  Vaping Use  . Vaping Use: Never used  Substance and Sexual Activity  . Alcohol use: Yes    Comment: daily, 2.7 oz   . Drug use: No  . Sexual activity: Not on file  Other Topics Concern  . Not on file  Social History Narrative  . Not on file   Social Determinants of Health   Financial Resource Strain: Not on file  Food Insecurity: Not on file  Transportation Needs: Not on file  Physical Activity: Not on file   Stress: Not on file  Social Connections: Not on file  Intimate Partner Violence: Not on file     ROS:  General: Negative for  fever, chills, fatigue. +  weakness. See hpi Eyes: Negative for vision changes.  ENT: Negative for hoarseness,   nasal congestion. See hpi CV: Negative for chest pain, angina, palpitations, dyspnea on exertion, peripheral edema.  Respiratory: Negative for dyspnea at rest, dyspnea on exertion, cough, sputum, wheezing.  GI: See history of present illness. GU:  Negative for dysuria, hematuria, urinary incontinence, urinary frequency, nocturnal urination.  MS: Negative for joint pain, low back pain.  Derm: Negative for rash or itching.  Neuro: Negative for weakness, abnormal sensation, seizure, frequent headaches, memory loss,+ confusion.  Psych: Negative for anxiety, depression, suicidal ideation, hallucinations.  Endo: Negative for unusual weight change.  Heme: Negative for bruising or bleeding. Allergy: Negative for rash or hives.       Physical Examination: Vital signs in last 24 hours: Temp:  [97.7 F (36.5 C)-98.2 F (36.8 C)] 98.2 F (36.8 C) (02/17 0530) Pulse Rate:  [72-110] 97 (02/17 0530) Resp:  [16-30] 20 (02/17 0222) BP: (90-107)/(57-83) 90/58 (02/17 0530) SpO2:  [86 %-100 %] 87 % (02/17 0222) Weight:  [79.2 kg] 79.2 kg (02/16 2024)    General: Well-nourished, well-developed somewhat frail appearing in no acute distress. Hard of hearing Head: Normocephalic, atraumatic.   Eyes: Conjunctiva pink, no icterus. Mouth: Oropharyngeal mucosa moist and pink , no lesions erythema or exudate. Neck: Supple without thyromegaly, masses, or lymphadenopathy.  Lungs: Clear to auscultation bilaterally.  Heart: Regular rate and rhythm, no murmurs rubs or gallops.  Abdomen: Bowel sounds are normal, nontender, nondistended, no hepatosplenomegaly or masses, no abdominal bruits or    hernia , no rebound or guarding.   Rectal: not performed Extremities: trace  bilateral lower extremity edema. No clubbing, deformity.  Neuro: Alert and oriented x 4 , grossly normal neurologically.  Skin: Warm and dry, no rash or jaundice.   Psych: Alert and cooperative, normal mood and affect.        Intake/Output from previous day: No intake/output data recorded. Intake/Output this shift: No intake/output data recorded.  Lab Results: CBC Recent Labs    09/23/20 2142 09/24/20 0452  WBC 9.3 8.1  HGB 8.0* 7.4*  HCT 25.4* 23.8*  MCV 92.7 93.0  PLT 212 218   BMET Recent Labs    09/23/20 2142 09/24/20 0452  NA 136 140  K 3.9 3.9  CL 96* 95*  CO2 31 34*  GLUCOSE 110* 99  BUN 57* 61*  CREATININE 1.61* 1.52*  CALCIUM 8.5* 8.8*   LFT Recent Labs    09/23/20 2142 09/24/20 0452  BILITOT 0.8 0.9  ALKPHOS 55 51  AST 24 23  ALT 29 27  PROT 5.9* 5.5*  ALBUMIN 2.9* 2.8*    Lipase No results for input(s): LIPASE in the last 72 hours.  PT/INR Recent Labs    09/23/20 2142  LABPROT 20.0*  INR 1.8*      Imaging Studies: CT HEAD WO CONTRAST  Result Date: 09/23/2020 CLINICAL DATA:  Weakness, altered level of consciousness EXAM: CT HEAD WITHOUT CONTRAST TECHNIQUE: Contiguous axial images were obtained from the base of the skull through the vertex without intravenous contrast. COMPARISON:  12/22/2009 FINDINGS: Brain: No acute infarct or hemorrhage. There is mild diffuse cerebral atrophy, age-appropriate. Lateral ventricles and midline structures are unremarkable. No acute extra-axial fluid collections. No mass effect. Vascular: No hyperdense vessel or unexpected calcification. Skull: Normal. Negative for fracture or focal lesion. Sinuses/Orbits: Postsurgical changes from right mastoidectomy. Chronic bilateral mastoid effusions. Paranasal sinuses are clear. Other: None. IMPRESSION: 1. Age-appropriate cerebral atrophy.  No acute intracranial process.  Electronically Signed   By: Randa Ngo M.D.   On: 09/23/2020 21:19   DG Chest Port 1 View  Result  Date: 09/17/2020 CLINICAL DATA:  87 year old male with bradycardia, shortness of breath status post COVID-19 three weeks ago. Atrial fibrillation. EXAM: PORTABLE CHEST 1 VIEW COMPARISON:  Portable chest 09/16/2020 and earlier. FINDINGS: Portable AP semi upright view at 0703 hours. Improved lung volumes. Mild cardiomegaly is stable. Other mediastinal contours are within normal limits. Visualized tracheal air column is within normal limits. Veiling bilateral lower lung opacity, dense retrocardiac opacity. But bilateral pulmonary vascularity appears decreased, more normal. No pneumothorax. No air bronchograms. No acute osseous abnormality identified. IMPRESSION: 1. Small to moderate bilateral pleural effusions with lower lobe collapse or consolidation. 2. Normalized pulmonary vascularity since yesterday. Electronically Signed   By: Genevie Ann M.D.   On: 09/17/2020 07:12   DG Chest Portable 1 View  Result Date: 09/16/2020 CLINICAL DATA:  Short of breath, bradycardia EXAM: PORTABLE CHEST 1 VIEW COMPARISON:  03/24/2019 FINDINGS: Stable large cardiac silhouette. Bilateral small pleural effusions. Mild central venous congestion. No focal infiltrate. No overt pulmonary edema. No pneumothorax. IMPRESSION: Small bilateral pleural effusions and central venous congestion. Electronically Signed   By: Suzy Bouchard M.D.   On: 09/16/2020 12:24   VAS [place] LOWER EXTREMITY VENOUS (DVT)  Result Date: 09/17/2020  Lower Venous DVT Study Indications: Edema.  Comparison Study: No previous exams. Performing Technologist: Rogelia Rohrer  Examination Guidelines: A complete evaluation includes B-mode imaging, spectral Doppler, color Doppler, and power Doppler as needed of all accessible portions of each vessel. Bilateral testing is considered an integral part of a complete examination. Limited examinations for reoccurring indications may be performed as noted. The reflux portion of the exam is performed with the patient in reverse  Trendelenburg.  +---------+---------------+---------+-----------+----------+--------------+ RIGHT    CompressibilityPhasicitySpontaneityPropertiesThrombus Aging +---------+---------------+---------+-----------+----------+--------------+ CFV      Full           Yes      Yes                                 +---------+---------------+---------+-----------+----------+--------------+ SFJ      Full                                                        +---------+---------------+---------+-----------+----------+--------------+ FV Prox  Full           Yes      Yes                                 +---------+---------------+---------+-----------+----------+--------------+ FV Mid   Full           Yes      Yes                                 +---------+---------------+---------+-----------+----------+--------------+ FV DistalFull           Yes      Yes                                 +---------+---------------+---------+-----------+----------+--------------+ PFV  Full                                                        +---------+---------------+---------+-----------+----------+--------------+ POP      Full           Yes      Yes                                 +---------+---------------+---------+-----------+----------+--------------+ PTV      Full                                                        +---------+---------------+---------+-----------+----------+--------------+ PERO     Full                                                        +---------+---------------+---------+-----------+----------+--------------+   +---------+---------------+---------+-----------+----------+--------------+ LEFT     CompressibilityPhasicitySpontaneityPropertiesThrombus Aging +---------+---------------+---------+-----------+----------+--------------+ CFV      Full           Yes      Yes                                  +---------+---------------+---------+-----------+----------+--------------+ SFJ      Full                                                        +---------+---------------+---------+-----------+----------+--------------+ FV Prox  Full           Yes      Yes                                 +---------+---------------+---------+-----------+----------+--------------+ FV Mid   Full           Yes      Yes                                 +---------+---------------+---------+-----------+----------+--------------+ FV DistalFull           Yes      Yes                                 +---------+---------------+---------+-----------+----------+--------------+ PFV      Full                                                        +---------+---------------+---------+-----------+----------+--------------+ POP  Full           Yes      Yes                                 +---------+---------------+---------+-----------+----------+--------------+ PTV      Full                                                        +---------+---------------+---------+-----------+----------+--------------+ PERO     Full                                                        +---------+---------------+---------+-----------+----------+--------------+     Summary: BILATERAL: - No evidence of deep vein thrombosis seen in the lower extremities, bilaterally. - No evidence of superficial venous thrombosis in the lower extremities, bilaterally. -No evidence of popliteal cyst, bilaterally.   *See table(s) above for measurements and observations. Electronically signed by Servando Snare MD on 09/17/2020 at 1:04:38 PM.    Final   [4 week]   Impression: 87 year old male with recent admission for acute on chronic heart failure, bradycardia, and mental status changes, A. fib on Eliquis, stage IIIb chronic kidney disease, Barrett's esophagus with indefinite dysplasia presented to the hospital with  progressive weakness, decreased oral intake, heartburn sensation and mental status changes.  In the ED it was noted that his hemoglobin had declined since recent admission and he had melena on exam.   Melena/dysphagia: Hemoglobin 9.8 on February 11 down to 7.4 today.  With known Barrett's esophagus, overdue for repeat EGD because of concern for possible dysplasia at time of biopsy in 2019.  Patient has been on Eliquis for his A. fib, last dose at 6 PM on February 16.  Also on iron over the past couple months due to recurrent small-volume rectal bleeding.  No reported NSAID or aspirin use.  Would be concerned about upper GI bleed from possible malignancy, peptic ulcer disease, less likely bleeding from varices or portal gastropathy (CT 2020 with normal-appearing liver, no evidence of advanced liver disease on 2019 EGD).  Patient also complaining of heartburn sensations when he presented.  Troponin elevated, trending downward, per attending felt to be demand ischemia.  Rectal bleeding: Currently scheduled to see general surgery due to persistent bleeding over the past couple of months.  Unclear when last colonoscopy was.  May require further evaluation as inpatient depending on clinical course.  Will monitor for now.  Mental status changes: Per daughter, patient had no issues with mental status changes/confusion/behavioral issues prior to admission on February 9.  Hospitalized for 3 days, discharged on February 12.  Has been consistently confused, agitated since that time.  Current head CT unremarkable.  Patient historically drinks multiple alcoholic beverages at night, last drink prior to February 9 admission.  Management per attending.    Elevated troponins: per attending, suspect demand ischemia. Patient was scheduled for outpatient Holter for bradycardia but due to MS changes it has not been placed on patient per daughter.   Plan: 1. Patient will need upper endoscopy plus or minus esophageal dilation  this admission.  Last dose  of Eliquis February 16 at 6 PM.  I have spoke to both patient and his daughter David Acosta who are in agreement to proceed.  I have discussed the risks, alternatives, benefits with regards to but not limited to the risk of reaction to medication, bleeding, infection, perforation and the patient is agreeable to proceed. Written consent to be obtained. 2. Recheck H&H at noon. 3. IV pantoprazole 40 mg every 12 hours. 4. Keep NPO until timing of EGD determined. 5. Monitor for rectal bleeding.   We would like to thank you for the opportunity to participate in the care of David Acosta.  Laureen Ochs. Bernarda Caffey [facility] [phone] 2/17/202210:08 AM     LOS: 0 days

## 2020-09-24 NOTE — TOC Initial Note (Signed)
Transition of Care [facility]) - Initial/Assessment Note   Patient Details  Name: David Acosta MRN: [id number] Date of Birth: 01-10-1926  Transition of Care [facility]) [name]:    Sherie Don, LCSW Phone Number: 09/24/2020, 3:42 PM  Clinical Narrative: Patient is a 87 year old male who was admitted for melena. Readmission checklist completed due to high readmission score. Daughter requested call from [facility] regarding patient missing his OP palliative appointment tomorrow with [facility].  Per daughter, patient resides in his son's home with his spouse. Patient's current DME includes a walker and wheelchair. Patient is currently active with [facility]. Daughter reported patient takes his medications as prescribed, but is experiencing swallowing issues. Daughter reported patient has been chewing food and spits it out, so patient is eating very little food at this time. Patient's ability to complete ADLs started to decline about 1.5 weeks ago. Patient is experiencing generalized weakness and is having frequent falls at home. Daughter provides transportation to doctor appointments.  CSW spoke with Bevely Palmer with [place]. Bevely Palmer agreeable to reaching out daughter regarding the palliative appointment. TOC to follow.  Expected Discharge Plan: Home/Self Care Barriers to Discharge: Continued Medical Work up  Patient Goals and CMS Choice Patient states their goals for this hospitalization and ongoing recovery are:: Discharge home with OP palliative care CMS Medicare.gov Compare Post Acute Care list provided to:: Patient Represenative (must comment) [name] (daughter)) Choice offered to / list presented to : Adult Children  Expected Discharge Plan and Services Expected Discharge Plan: Home/Self Care In-house Referral: Clinical Social Work Discharge Planning Services: NA Post Acute Care Choice: NA Living arrangements for the past 2 months: Single Family Home            DME Arranged:  N/A DME Agency: NA HH Arranged: PT HH Agency: [place]  Prior Living Arrangements/Services Living arrangements for the past 2 months: Single Family Home Lives with:: Spouse,Adult Children Patient language and need for interpreter reviewed:: Yes Do you feel safe going back to the place where you live?: Yes      Need for Family Participation in Patient Care: Yes (Comment) Care giver support system in place?: Yes (comment) Current home services: DME [facility] Rile, wheelchair) Criminal Activity/Legal Involvement Pertinent to Current Situation/Hospitalization: No - Comment as needed  Activities of Daily Living Home Assistive Devices/Equipment: Environmental consultant (specify type) ADL Screening (condition at time of admission) Patient's cognitive ability adequate to safely complete daily activities?: Yes Is the patient deaf or have difficulty hearing?: No Does the patient have difficulty seeing, even when wearing glasses/contacts?: No Does the patient have difficulty concentrating, remembering, or making decisions?: No Patient able to express need for assistance with ADLs?: Yes Does the patient have difficulty dressing or bathing?: No Independently performs ADLs?: Yes (appropriate for developmental age) Does the patient have difficulty walking or climbing stairs?: Yes Weakness of Legs: Both Weakness of Arms/Hands: None  Permission Sought/Granted Permission sought to share information with : Other (comment) Permission granted to share information with : Yes, Verbal Permission Granted Permission granted to share info w AGENCY: [facility]  Emotional Assessment Appearance:: Appears stated age Orientation: : Oriented to Self,Oriented to  Time,Oriented to Place,Oriented to Situation Alcohol / Substance Use: Not Applicable Psych Involvement: No (comment)  Admission diagnosis:  Melena [K92.1] Abnormal ECG [R94.31] Elevated troponin [R77.8] Generalized weakness [R53.1] Depressed mood  [R45.89] Symptomatic anemia [D64.9] Patient Active Problem List   Diagnosis Date Noted  . Chronic renal disease, stage 3b ([place])   .  Melena 09/23/2020  . Hypoalbuminemia 09/23/2020  . Acute blood loss anemia 09/23/2020  . Glaucoma   . Elevated troponin   . Acute CHF (congestive heart failure) ([place]) 09/16/2020  . CHF (congestive heart failure) ([place]) 09/16/2020  . Symptomatic bradycardia   . Hyponatremia   . Chest pain 03/24/2019  . Acute diastolic CHF (congestive heart failure) ([place]) 03/24/2019  . Unspecified atrial fibrillation ([place]) 03/24/2019  . Hypokalemia 03/24/2019  . Chronic diastolic heart failure ([place]) 03/24/2019  . Dysphagia 10/05/2017  . Esophageal dysphagia 10/05/2017  . PVC (premature ventricular contraction) 03/12/2013  . HTN (hypertension) 03/12/2013   PCP:  Christain Sacramento, MD Pharmacy:   [id number] [place], [place] - [address] [place] [address] Phone: [phone] Fax: [facility] [place], [place] [address] Phone: [phone] Fax: [phone]  Readmission Risk Interventions Readmission Risk Prevention Plan 09/24/2020  Transportation Screening Complete  HRI or Home Care Consult Complete  Social Work Consult for [place] Planning/Counseling Complete  Palliative Care Screening Not Applicable  Medication Review Press photographer) Complete  Some recent data might be hidden
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
## 2020-09-24 NOTE — Progress Notes (Signed)
H/H this afternoon 6.4/20.9. Palliative consult appreciated. Family to discuss possible comfort measures tonight and make decision by AM. Daughter, [name], requesting to hold off EGD until decision made. Agreeable to blood transfusion. Discuss with Dr. Wynetta Emery. Will plan for 2 units prbcs with dose of lasix in between. Will reassess in AM.  Laureen Ochs. Bernarda Caffey [facility] [phone] 2/17/20225:51 PM

## 2020-09-24 NOTE — Progress Notes (Signed)
Spoke with pt's daughter [name] by phone. She states pt's confusion is relatively new development in the past 3 - 4 weeks since being started on gabapentin. She states he was taken off the gabapentin d/t confusion but was dx with hospital induced delerium after recent admission to [facility]. She states yesterday pt's behavior much worse, combative and throwing things at home, so she sent him via ambulance to the ED. She advises that she will be coming within the next hour and would like to speak with MD today. MD Wynetta Emery notified of daughter's phone number and request to speak with him regarding plan of care.

## 2020-09-24 NOTE — Progress Notes (Signed)
PROGRESS NOTE   David Acosta  JKK:[id number] DOB: 09/22/1925 DOA: 09/23/2020 PCP: Christain Sacramento, MD   Chief Complaint  Patient presents with  . Weakness   Level of care: Med-Surg  Brief Admission History:  87 y.o. male with medical history significant of unspecified skin cancer, dysphagia, glaucoma, hypertension, macular degeneration, chronic diastolic heart failure, PVCs, unspecified atrial fibrillation, stage IIIb CKD, recently discharged from [facility] (09/19/2020) due to acute combined diastolic and systolic HF who is coming to the emergency department due to weakness, decreased appetite and heartburn sensation.  He has also felt lightheaded.  The family also told the EMS crew that his mental status was altered, but the patient is oriented now in the ED.  He denies fever, chills, sore throat or rhinorrhea.  Assessment & Plan:   Principal Problem:   Melena Active Problems:   HTN (hypertension)   Unspecified atrial fibrillation (HCC)   Chronic diastolic heart failure (HCC)   Hypoalbuminemia   Acute blood loss anemia   Glaucoma   Elevated troponin   Chronic renal disease, stage 3b ([place])  1. Melena/acute blood loss anemia -continue IV protonix, holding home apixaban, type and screen, appreciate GI evaluation. Plan EGD if cleared by cardiology.   2. Essential hypertension - temporarily holding home torsemide, would resume after EGD.  3. Persistent atrial fibrillation -holding home apixaban in setting of anemia/GI bleeding.  4. Elevated troponin - attributed to demand ischemia in setting of anemia.  5. Chronic combined heart failure - stable for now, following.  Cardiology planning limited echocardiogram to be done as work up prior to EGD.   6. Stage 3a CKD - Monitoring closely.  7. Glaucoma - resumed home therapy.  8. Hypoalbuminemia - dietitian consultation requested.   9. Goals of care: discussed with daughter and she says that he had outpatient palliative care consult planned for  2/18 and would like him seen in hospital if possible.  Unfortunately he has had a precipitous decline in the last month and they worry about being able to care for him at home.  10. Dementia - He had not taken seroquel last night.  I gave him a dose this morning and resume nightly dosing.  Haldol was ordered as needed for severe agitation.   DVT prophylaxis: SCDs Code Status: DNR  Family Communication: daughter [name] updated  Disposition: TBD (requesting PT evaluation) Status is: Inpatient  Remains inpatient appropriate because:IV treatments appropriate due to intensity of illness or inability to take PO and Inpatient level of care appropriate due to severity of illness  Dispo: The patient is from: Home              Anticipated d/c is to: TBD              Anticipated d/c date is: 2 days              Patient currently is not medically stable to d/c.   Difficult to place patient No  Consultants:   GI   Procedures:   tentative  Antimicrobials:   Subjective: Pt is confused today.  He has no specific complaints.   Objective: Vitals:   09/24/20 0034 09/24/20 0222 09/24/20 0530 09/24/20 1043  BP:  97/62 (!) 90/58 (!) 102/56  Pulse: 72 96 97 95  Resp:  20  20  Temp:  97.7 F (36.5 C) 98.2 F (36.8 C)   TempSrc:  Oral Oral   SpO2: 94% (!) 87%  93%  Weight:  Height:        Intake/Output Summary (Last 24 hours) at 09/24/2020 1329 Last data filed at 09/24/2020 0900 Gross per 24 hour  Intake 0 ml  Output -  Net 0 ml   Filed Weights   09/23/20 2024  Weight: 79.2 kg    Examination:  General exam: frail, elderly male, awake, alert, confused, Appears calm and comfortable  Respiratory system: Clear to auscultation. Respiratory effort normal. Cardiovascular system: normal S1 & S2 heard. No JVD, murmurs, rubs, gallops or clicks. No pedal edema. Gastrointestinal system: Abdomen is nondistended, soft and nontender. No organomegaly or masses felt. Normal bowel sounds  heard. Central nervous system: Alert and oriented. No focal neurological deficits. Extremities: Symmetric 5 x 5 power. Skin: No rashes, lesions or ulcers Psychiatry: Judgement and insight appear poor. Mood & affect appropriate.   Data Reviewed: I have personally reviewed following labs and imaging studies  CBC: Recent Labs  Lab 09/18/20 0127 09/18/20 1737 09/23/20 2142 09/24/20 0452  WBC 9.6 7.7 9.3 8.1  HGB 9.6* 9.8* 8.0* 7.4*  HCT 29.4* 31.0* 25.4* 23.8*  MCV 92.5 94.2 92.7 93.0  PLT 318 311 212 161    Basic Metabolic Panel: Recent Labs  Lab 09/18/20 0127 09/18/20 1737 09/19/20 0415 09/23/20 2142 09/24/20 0452  NA 130* 129* 131* 136 140  K 4.7 4.9 4.8 3.9 3.9  CL 92* 91* 93* 96* 95*  CO2 28 27 30 31  34*  GLUCOSE 98 126* 110* 110* 99  BUN 30* 33* 35* 57* 61*  CREATININE 1.68* 1.92* 1.80* 1.61* 1.52*  CALCIUM 8.8* 8.3* 8.6* 8.5* 8.8*  MG  --  2.4  --   --   --   PHOS  --  4.0  --   --   --     GFR: Estimated Creatinine Clearance: 31 mL/min (A) (by C-G formula based on SCr of 1.52 mg/dL (H)).  Liver Function Tests: Recent Labs  Lab 09/18/20 1737 09/23/20 2142 09/24/20 0452  AST 71* 24 23  ALT 52* 29 27  ALKPHOS 101 55 51  BILITOT 0.9 0.8 0.9  PROT 6.5 5.9* 5.5*  ALBUMIN 2.9* 2.9* 2.8*    CBG: Recent Labs  Lab 09/18/20 2353 09/19/20 0433 09/19/20 0821 09/19/20 1019 09/19/20 1217  GLUCAP 142* 115* 89 115* 95    Recent Results (from the past 240 hour(s))  Blood Culture (routine x 2)     Status: None   Collection Time: 09/16/20 12:38 PM   Specimen: BLOOD  Result Value Ref Range Status   Specimen Description BLOOD RIGHT ANTECUBITAL  Final   Special Requests   Final    BOTTLES DRAWN AEROBIC AND ANAEROBIC Blood Culture results may not be optimal due to an excessive volume of blood received in culture bottles   Culture   Final    NO GROWTH 5 DAYS Performed at [facility], [place] [address], [place], [place] [address]    Report Status  09/21/2020 FINAL  Final  Blood Culture (routine x 2)     Status: None   Collection Time: 09/16/20 12:44 PM   Specimen: BLOOD RIGHT WRIST  Result Value Ref Range Status   Specimen Description BLOOD RIGHT WRIST  Final   Special Requests   Final    BOTTLES DRAWN AEROBIC AND ANAEROBIC Blood Culture adequate volume   Culture   Final    NO GROWTH 5 DAYS Performed at [facility], [place] [address], [place], [place] [address]    Report  Status 09/21/2020 FINAL  Final  Resp Panel by RT-PCR (Flu A&B, Covid) Nasopharyngeal Swab     Status: None   Collection Time: 09/16/20 12:51 PM   Specimen: Nasopharyngeal Swab; Nasopharyngeal(NP) swabs in vial transport medium  Result Value Ref Range Status   SARS Coronavirus 2 by RT PCR NEGATIVE NEGATIVE Final    Comment: (NOTE) SARS-CoV-2 target nucleic acids are NOT DETECTED.  The SARS-CoV-2 RNA is generally detectable in upper respiratory specimens during the acute phase of infection. The lowest concentration of SARS-CoV-2 viral copies this assay can detect is 138 copies/mL. A negative result does not preclude SARS-Cov-2 infection and should not be used as the sole basis for treatment or other patient management decisions. A negative result may occur with  improper specimen collection/handling, submission of specimen other than nasopharyngeal swab, presence of viral mutation(s) within the areas targeted by this assay, and inadequate number of viral copies(<138 copies/mL). A negative result must be combined with clinical observations, patient history, and epidemiological information. The expected result is Negative.  Fact Sheet for Patients:  EntrepreneurPulse.com.au  Fact Sheet for Healthcare Providers:  IncredibleEmployment.be  This test is no t yet approved or cleared by the [place] FDA and  has been authorized for detection and/or diagnosis of SARS-CoV-2 by FDA under an Emergency Use Authorization  (EUA). This EUA will remain  in effect (meaning this test can be used) for the duration of the COVID-19 declaration under Section 564(b)(1) of the Act, 21 U.S.C.section 360bbb-3(b)(1), unless the authorization is terminated  or revoked sooner.       Influenza A by PCR NEGATIVE NEGATIVE Final   Influenza B by PCR NEGATIVE NEGATIVE Final    Comment: (NOTE) The Xpert Xpress SARS-CoV-2/FLU/RSV plus assay is intended as an aid in the diagnosis of influenza from Nasopharyngeal swab specimens and should not be used as a sole basis for treatment. Nasal washings and aspirates are unacceptable for Xpert Xpress SARS-CoV-2/FLU/RSV testing.  Fact Sheet for Patients: EntrepreneurPulse.com.au  Fact Sheet for Healthcare Providers: IncredibleEmployment.be  This test is not yet approved or cleared by the [place] FDA and has been authorized for detection and/or diagnosis of SARS-CoV-2 by FDA under an Emergency Use Authorization (EUA). This EUA will remain in effect (meaning this test can be used) for the duration of the COVID-19 declaration under Section 564(b)(1) of the Act, 21 U.S.C. section 360bbb-3(b)(1), unless the authorization is terminated or revoked.  Performed at [facility], [place] [address] [place], [place] [address]      Radiology Studies: CT HEAD WO CONTRAST  Result Date: 09/23/2020 CLINICAL DATA:  Weakness, altered level of consciousness EXAM: CT HEAD WITHOUT CONTRAST TECHNIQUE: Contiguous axial images were obtained from the base of the skull through the vertex without intravenous contrast. COMPARISON:  12/22/2009 FINDINGS: Brain: No acute infarct or hemorrhage. There is mild diffuse cerebral atrophy, age-appropriate. Lateral ventricles and midline structures are unremarkable. No acute extra-axial fluid collections. No mass effect. Vascular: No hyperdense vessel or unexpected calcification. Skull: Normal. Negative for fracture or focal  lesion. Sinuses/Orbits: Postsurgical changes from right mastoidectomy. Chronic bilateral mastoid effusions. Paranasal sinuses are clear. Other: None. IMPRESSION: 1. Age-appropriate cerebral atrophy.  No acute intracranial process. Electronically Signed   By: Randa Ngo M.D.   On: 09/23/2020 21:19   ECHOCARDIOGRAM LIMITED  Result Date: 09/24/2020    ECHOCARDIOGRAM LIMITED REPORT   Patient Name:   David Acosta Date of Exam: 09/24/2020 Medical Rec #:  [id number]  Height:       69.0 in Accession #:    [id number]      Weight:       174.6 lb Date of Birth:  [birth year]      BSA:          1.950 m Patient Age:    63 years        BP:           102/56 mmHg Patient Gender: M               HR:           95 bpm. Exam Location:  [facility] Procedure: Limited Echo and Intracardiac Opacification Agent Indications:    Cardiomyopathy-Unspecified I42.9  History:        Patient has prior history of Echocardiogram examinations, most                 recent 08/04/2020. CHF, Arrythmias:Atrial Fibrillation,                 Signs/Symptoms:Chest Pain; Risk Factors:Hypertension and Former                 Smoker. Elevated Troponin, Chronic Renal Disease.  Sonographer:    [name] RDCS (AE) Referring Phys: [id number] [place]  1. Limited study with Definity contrast.  2. Left ventricular ejection fraction, by estimation, is 45 to 50%. The left ventricle has mildly decreased function. The left ventricle demonstrates global hypokinesis. There is mild left ventricular hypertrophy. Left ventricular diastolic parameters are indeterminate in the setting of atrial fibrillation.  3. RV-RA gradient 25 mmHg. Right ventricular systolic function is moderately reduced. The right ventricular size is mildly enlarged.  4. Left atrial size was severely dilated.  5. The mitral valve is abnormal, mildly thickened. Mild mitral valve regurgitation.  6. The aortic valve is tricuspid. There is mild calcification of the aortic  valve. Aortic valve regurgitation is not visualized.  7. Unable to estimate CVP. FINDINGS  Left Ventricle: Left ventricular ejection fraction, by estimation, is 45 to 50%. The left ventricle has mildly decreased function. The left ventricle demonstrates global hypokinesis. Definity contrast agent was given IV to delineate the left ventricular  endocardial borders. The left ventricular internal cavity size was normal in size. There is mild left ventricular hypertrophy. Left ventricular diastolic parameters are indeterminate. Right Ventricle: RV-RA gradient 25 mmHg. The right ventricular size is mildly enlarged. No increase in right ventricular wall thickness. Right ventricular systolic function is moderately reduced. Left Atrium: Left atrial size was severely dilated. Right Atrium: Right atrial size was normal in size. Pericardium: There is no evidence of pericardial effusion. Mitral Valve: The mitral valve is abnormal. There is mild thickening of the mitral valve leaflet(s). Mild mitral valve regurgitation. Tricuspid Valve: The tricuspid valve is grossly normal. Tricuspid valve regurgitation is mild. Aortic Valve: The aortic valve is tricuspid. There is mild calcification of the aortic valve. There is mild aortic valve annular calcification. Aortic valve regurgitation is not visualized. Pulmonic Valve: The pulmonic valve was grossly normal. Pulmonic valve regurgitation is trivial. Aorta: The aortic root is normal in size and structure. Venous: Unable to estimate CVP. The inferior vena cava was not well visualized. LEFT VENTRICLE PLAX 2D LVIDd:         4.82 cm  Diastology LVIDs:         3.53 cm  LV e' medial:    6.57 cm/s LV PW:  1.58 cm  LV E/e' medial:  12.1 LV IVS:        1.20 cm  LV e' lateral:   8.60 cm/s LVOT diam:     2.00 cm  LV E/e' lateral: 9.3 LVOT Area:     3.14 cm  LEFT ATRIUM            Index LA diam:      3.00 cm  1.54 cm/m LA Vol (A4C): 102.0 ml 52.30 ml/m   AORTA Ao Root diam: 3.40 cm  MITRAL VALVE               TRICUSPID VALVE MV Area (PHT): 3.21 cm    TR Peak grad:   25.0 mmHg MV Decel Time: 236 msec    TR Vmax:        250.00 cm/s MV E velocity: 79.70 cm/s                            SHUNTS                            Systemic Diam: 2.00 cm Rozann Lesches MD Electronically signed by Rozann Lesches MD Signature Date/Time: 09/24/2020/1:20:05 PM    Final    Scheduled Meds: . hydrocortisone  25 mg Rectal BID  . latanoprost  1 drop Both Eyes QHS  . levothyroxine  25 mcg Oral Q0600  . pantoprazole (PROTONIX) IV  40 mg Intravenous Q12H  . QUEtiapine  25 mg Oral QHS   Continuous Infusions:   LOS: 0 days   Time spent: 37 minutes   [place] Wynetta Emery, MD How to contact the [facility] Attending or Consulting provider [place] or covering provider during after hours [place], for this patient?  1. Check the care team in [facility] and look for a) attending/consulting TRH provider listed and b) the [facility] team listed 2. Log into [email or web address] and use [place]'s universal password to access. If you do not have the password, please contact the hospital operator. 3. Locate the [facility] provider you are looking for under [facility] and page to a number that you can be directly reached. 4. If you still have difficulty reaching the provider, please page the [facility] (Director on Call) for the Hospitalists listed on amion for assistance.  09/24/2020, 1:29 PM

## 2020-09-24 NOTE — Consult Note (Signed)
Cardiology Consultation:   Patient ID: EPIC TRIBBETT MRN: [id number]; DOB: Feb 19, 1926  Admit date: 09/23/2020 Date of Consult: 09/24/2020  PCP:  Christain Sacramento, MD   [place]  Cardiologist:  Josue Hector, MD Advanced Practice Provider:  No care team member to display Electrophysiologist:  None  46}   Patient Profile:   David Acosta is a 87 y.o. male with a hx of CHF (diastolic dysfunction), AFIB, bradycardia, recent COVID pneumonia (late 2021), CKD, dysphagia, HTN, and GI bleeding who is being seen today for risk stratification prior to EGD at the request of Irwin Brakeman, MD.  History of Present Illness:   David Acosta is a 87 year old gentleman with a past medical history significant for systolic congestive heart failure (EF 45%, 07/2020), symptomatic bradycardia (with Cardizem recently discontinued), COVID-19 pneumonia in winter 2021, chronic atrial fibrillation (on Eliquis), stage IIIb CKD, dysphagia, prior rectal bleeding due to hemorrhoids, hypertension, macular degeneration, and skin cancer, who was admitted to the hospital with complaints of weakness, decreased appetite and heartburn.  He also had some mental status changes. CT of the head showed age-appropriate cerebral atrophy.  There was no acute intracranial process. He was found to have melanotic stools and was diagnosed with acute blood loss anemia. The H/H was 7.4/23.8. In June 2021 the H/H was 12.5/39.2.  The patient never complained of chest pain.  The high-sensitivity troponins were 117 and 112. The electrocardiogram on 09/23/2020 at 2036, that I reviewed personally, showed atrial fibrillation, rate 104 bpm, IVCD and nonspecific T wave abnormalities.  After a recent hospitalization for bradycardia (09/16/2020 to 09/19/2020) the patient was supposed to follow-up with Dr. Johnsie Cancel in clinic.  A cardiac monitor study is pending (to be completed as an outpatient).  There was also a plan for a  myocardial perfusion study at some point and this has not been done yet.  Pertinent cardiac history Normal cath in 2002.  Normal ETT 2014 Holter only 3% PVC;s EF 60-65% by echo Monitor 05/14/19 showed 100% chronic afib with good rate control  Myovue 06/12/19 normal no ischemia EF 67%    Past Medical History:  Diagnosis Date  . Atrial fibrillation ([place])   . Cancer ([facility])    Skin  . Dysphagia 10/05/2017  . Glaucoma   . Heart failure ([place])   . Hypertension   . Macular degeneration   . Premature ventricular contraction 2013    Past Surgical History:  Procedure Laterality Date  . CATARACT EXTRACTION W/PHACO Left 01/28/2014   Procedure: CATARACT EXTRACTION PHACO AND INTRAOCULAR LENS PLACEMENT (IOC);  Surgeon: Elta Guadeloupe T. Gershon Crane, MD;  Location: [facility];  Service: Ophthalmology;  Laterality: Left;  CDE 16.74  . CATARACT EXTRACTION W/PHACO Right 02/18/2014   Procedure: CATARACT EXTRACTION PHACO AND INTRAOCULAR LENS PLACEMENT (IOC);  Surgeon: Elta Guadeloupe T. Gershon Crane, MD;  Location: [facility];  Service: Ophthalmology;  Laterality: Right;  CDE 9.15  . cholesteoma mastoidectomy Right 2012  . Dupytrens contraction Right 2010  . ESOPHAGEAL DILATION N/A 11/23/2017   Procedure: ESOPHAGEAL DILATION;  Surgeon: Rogene Houston, MD;  Location: [facility];  Service: Endoscopy;  Laterality: N/A;  . ESOPHAGOGASTRODUODENOSCOPY N/A 11/23/2017   Rehman: Barrett's esophagus with indefinite dysplasia, high grade esoph stenosis at GEJ s/p dilation, 3 cm hh  . NASAL SEPTUM SURGERY    . TONSILLECTOMY       Inpatient Medications: Scheduled Meds: . hydrocortisone  25 mg Rectal BID  . latanoprost  1 drop Both Eyes QHS  .  levothyroxine  25 mcg Oral Q0600  . pantoprazole (PROTONIX) IV  40 mg Intravenous Q12H  . QUEtiapine  25 mg Oral QHS   Continuous Infusions:  PRN Meds: acetaminophen **OR** acetaminophen, albuterol, ALPRAZolam, ondansetron **OR** ondansetron (ZOFRAN) IV  Allergies:    Allergies  Allergen Reactions   . Capsaicin Other (See Comments)    "heart started to race and blood pressure went up"  . Fluticasone Other (See Comments)    glaucoma  . Procardia Xl [Nifedipine] Other (See Comments)    Rapid heart beat  . Timolol Swelling    Social History:   Social History   Socioeconomic History  . Marital status: Married    Spouse name: Not on file  . Number of children: Not on file  . Years of education: Not on file  . Highest education level: Not on file  Occupational History  . Not on file  Tobacco Use  . Smoking status: Former Smoker    Quit date: 01/23/1959    Years since quitting: 61.7  . Smokeless tobacco: Never Used  Vaping Use  . Vaping Use: Never used  Substance and Sexual Activity  . Alcohol use: Yes    Comment: daily, 2.7 oz   . Drug use: No  . Sexual activity: Not on file  Other Topics Concern  . Not on file  Social History Narrative  . Not on file   Social Determinants of Health   Financial Resource Strain: Not on file  Food Insecurity: Not on file  Transportation Needs: Not on file  Physical Activity: Not on file  Stress: Not on file  Social Connections: Not on file  Intimate Partner Violence: Not on file    Family History:    Family History  Problem Relation Age of Onset  . Leukemia Mother   . Heart attack Father   . Heart failure Sister   . Heart attack Brother   . Uterine cancer Paternal Grandfather      ROS:  Please see the history of present illness.   All other ROS reviewed and negative.     Physical Exam/Data:   Vitals:   09/24/20 0034 09/24/20 0222 09/24/20 0530 09/24/20 1043  BP:  97/62 (!) 90/58 (!) 102/56  Pulse: 72 96 97 95  Resp:  20  20  Temp:  97.7 F (36.5 C) 98.2 F (36.8 C)   TempSrc:  Oral Oral   SpO2: 94% (!) 87%  93%  Weight:      Height:        Intake/Output Summary (Last 24 hours) at 09/24/2020 1420 Last data filed at 09/24/2020 0900 Gross per 24 hour  Intake 0 ml  Output --  Net 0 ml   Last 3 Weights  09/23/2020 09/19/2020 09/18/2020  Weight (lbs) 174 lb 9.7 oz 174 lb 9.7 oz 182 lb 5.1 oz  Weight (kg) 79.2 kg 79.2 kg 82.7 kg     Body mass index is 25.78 kg/m.  General:  Elderly, Well nourished, well developed, confused HEENT: normal Lymph: no adenopathy Neck: no JVD Endocrine:  No thryomegaly Vascular: No carotid bruits; FA pulses 2+ bilaterally without bruits  Cardiac:  normal S1, S2; irregularly irregular Lungs:  clear to auscultation bilaterally, no wheezing, rhonchi or rales  Abd: soft, nontender, no hepatomegaly  Ext: no edema  Musculoskeletal:  No deformities, BUE and BLE strength normal and equal Skin: warm and dry  Neuro:  CNs 2-12 intact, no focal abnormalities noted Psych:  Normal affect, confused  EKG:  The electrocardiogram on 09/23/2020 at 2036, that I reviewed personally, showed atrial fibrillation, rate 104 bpm, IVCD and nonspecific T wave abnormalities.  Telemetry:  Not on telemetry   Laboratory Data:  High Sensitivity Troponin:   Recent Labs  Lab 09/23/20 2142 09/23/20 2231  TROPONINIHS 117* 112*     Chemistry Recent Labs  Lab 09/19/20 0415 09/23/20 2142 09/24/20 0452  NA 131* 136 140  K 4.8 3.9 3.9  CL 93* 96* 95*  CO2 30 31 34*  GLUCOSE 110* 110* 99  BUN 35* 57* 61*  CREATININE 1.80* 1.61* 1.52*  CALCIUM 8.6* 8.5* 8.8*  GFRNONAA 35* 40* 43*  ANIONGAP 8 9 11     Recent Labs  Lab 09/18/20 1737 09/23/20 2142 09/24/20 0452  PROT 6.5 5.9* 5.5*  ALBUMIN 2.9* 2.9* 2.8*  AST 71* 24 23  ALT 52* 29 27  ALKPHOS 101 55 51  BILITOT 0.9 0.8 0.9   Hematology Recent Labs  Lab 09/18/20 1737 09/23/20 2142 09/24/20 0452  WBC 7.7 9.3 8.1  RBC 3.29* 2.74* 2.56*  HGB 9.8* 8.0* 7.4*  HCT 31.0* 25.4* 23.8*  MCV 94.2 92.7 93.0  MCH 29.8 29.2 28.9  MCHC 31.6 31.5 31.1  RDW 15.3 15.9* 15.9*  PLT 311 212 218   BNPNo results for input(s): BNP, PROBNP in the last 168 hours.  DDimer No results for input(s): DDIMER in the last 168  hours.   Radiology/Studies:  CT HEAD WO CONTRAST  Result Date: 09/23/2020 CLINICAL DATA:  Weakness, altered level of consciousness EXAM: CT HEAD WITHOUT CONTRAST TECHNIQUE: Contiguous axial images were obtained from the base of the skull through the vertex without intravenous contrast. COMPARISON:  12/22/2009 FINDINGS: Brain: No acute infarct or hemorrhage. There is mild diffuse cerebral atrophy, age-appropriate. Lateral ventricles and midline structures are unremarkable. No acute extra-axial fluid collections. No mass effect. Vascular: No hyperdense vessel or unexpected calcification. Skull: Normal. Negative for fracture or focal lesion. Sinuses/Orbits: Postsurgical changes from right mastoidectomy. Chronic bilateral mastoid effusions. Paranasal sinuses are clear. Other: None. IMPRESSION: 1. Age-appropriate cerebral atrophy.  No acute intracranial process. Electronically Signed   By: Randa Ngo M.D.   On: 09/23/2020 21:19   ECHOCARDIOGRAM LIMITED  Result Date: 09/24/2020    ECHOCARDIOGRAM LIMITED REPORT   Patient Name:   David Acosta Date of Exam: 09/24/2020 Medical Rec #:  [id number]       Height:       69.0 in Accession #:    [id number]      Weight:       174.6 lb Date of Birth:  10-07-1925      BSA:          1.950 m Patient Age:    57 years        BP:           102/56 mmHg Patient Gender: M               HR:           95 bpm. Exam Location:  [facility] Procedure: Limited Echo and Intracardiac Opacification Agent Indications:    Cardiomyopathy-Unspecified I42.9  History:        Patient has prior history of Echocardiogram examinations, most                 recent 08/04/2020. CHF, Arrythmias:Atrial Fibrillation,                 Signs/Symptoms:Chest Pain; Risk Factors:Hypertension and Former  Smoker. Elevated Troponin, Chronic Renal Disease.  Sonographer:    [name] RDCS (AE) Referring Phys: [id number] [place]  1. Limited study with Definity contrast.  2.  Left ventricular ejection fraction, by estimation, is 45 to 50%. The left ventricle has mildly decreased function. The left ventricle demonstrates global hypokinesis. There is mild left ventricular hypertrophy. Left ventricular diastolic parameters are indeterminate in the setting of atrial fibrillation.  3. RV-RA gradient 25 mmHg. Right ventricular systolic function is moderately reduced. The right ventricular size is mildly enlarged.  4. Left atrial size was severely dilated.  5. The mitral valve is abnormal, mildly thickened. Mild mitral valve regurgitation.  6. The aortic valve is tricuspid. There is mild calcification of the aortic valve. Aortic valve regurgitation is not visualized.  7. Unable to estimate CVP. FINDINGS  Left Ventricle: Left ventricular ejection fraction, by estimation, is 45 to 50%. The left ventricle has mildly decreased function. The left ventricle demonstrates global hypokinesis. Definity contrast agent was given IV to delineate the left ventricular  endocardial borders. The left ventricular internal cavity size was normal in size. There is mild left ventricular hypertrophy. Left ventricular diastolic parameters are indeterminate. Right Ventricle: RV-RA gradient 25 mmHg. The right ventricular size is mildly enlarged. No increase in right ventricular wall thickness. Right ventricular systolic function is moderately reduced. Left Atrium: Left atrial size was severely dilated. Right Atrium: Right atrial size was normal in size. Pericardium: There is no evidence of pericardial effusion. Mitral Valve: The mitral valve is abnormal. There is mild thickening of the mitral valve leaflet(s). Mild mitral valve regurgitation. Tricuspid Valve: The tricuspid valve is grossly normal. Tricuspid valve regurgitation is mild. Aortic Valve: The aortic valve is tricuspid. There is mild calcification of the aortic valve. There is mild aortic valve annular calcification. Aortic valve regurgitation is not  visualized. Pulmonic Valve: The pulmonic valve was grossly normal. Pulmonic valve regurgitation is trivial. Aorta: The aortic root is normal in size and structure. Venous: Unable to estimate CVP. The inferior vena cava was not well visualized. LEFT VENTRICLE PLAX 2D LVIDd:         4.82 cm  Diastology LVIDs:         3.53 cm  LV e' medial:    6.57 cm/s LV PW:         1.58 cm  LV E/e' medial:  12.1 LV IVS:        1.20 cm  LV e' lateral:   8.60 cm/s LVOT diam:     2.00 cm  LV E/e' lateral: 9.3 LVOT Area:     3.14 cm  LEFT ATRIUM            Index LA diam:      3.00 cm  1.54 cm/m LA Vol (A4C): 102.0 ml 52.30 ml/m   AORTA Ao Root diam: 3.40 cm MITRAL VALVE               TRICUSPID VALVE MV Area (PHT): 3.21 cm    TR Peak grad:   25.0 mmHg MV Decel Time: 236 msec    TR Vmax:        250.00 cm/s MV E velocity: 79.70 cm/s                            SHUNTS  Systemic Diam: 2.00 cm Rozann Lesches MD Electronically signed by Rozann Lesches MD Signature Date/Time: 09/24/2020/1:20:05 PM    Final      Assessment and Plan:   1. Risk stratification prior to EGD Limited echo completed (results listed below). Would not recommend further cardiac testing prior to scope. This will not have a meaningful impact on reducing his potential risk for events during any non-cardiac procedure.  -Close cardiac monitoring during EGD/scope -Avoid large volume shifts -Measures for DVT prophylaxis  2. Atrial fibrillation Avoid AV nodal blockers such as beta-blockers and calcium channel blockers due to issues with profound bradycardia (that resulted in a hospital admission from 09/16/2020 to 09/19/2020).  At that time his diltiazem was permanently discontinued.  Luckily his heart rate improved and he did not require implantation of a permanent pacemaker.  The primary issue here is his GI blood loss presumably from the use of low-dose Eliquis.  His oral anticoagulation has been discontinued.  GI work-up that will  likely include an EGD will be performed in the near future.  Resumption of anticoagulation should be discussed with the patient and his family since he appears to be high risk for bleeding complications.  It would be entirely reasonable if the patient opts not to take chronic oral anticoagulation from this point on.  3. Congestive heart failure The limited echocardiogram from today shows an EF of 45 to 50%.  There is mild LVH.  The left atrium is severely dilated.  There is mild MR.  The findings are more or less unchanged from his prior study on 08/04/2020.  The patient is clinically not volume overloaded. -Daily weights -Strict I and Os   CHA2DS2-VASc Score = 4  {Confirm score is correct.  This indicates a 4.8% annual risk of stroke. The patient's score is based upon: CHF History: Yes HTN History: Yes Diabetes History: No Stroke History: No Vascular Disease History: No Age Score: 2 Gender Score: 0     For questions or updates, please contact [place] Please consult [email or web address] for contact info under    Signed, Meade Maw, MD  09/24/2020 2:20 PM

## 2020-09-24 NOTE — Progress Notes (Incomplete)
Pt pulled purewick off, bed wet with urine. Pt bathed and bed linens changed. Pt with smear of green/black tinted stool, daughter states pt is taking iron pills and this is the normal color of his stool. No bleeding noted, daughter states he does have a hemorrhoid that will bleed occasionally. Anusol suppository admin'd without difficulty. Pt took oral med with spoonful of applesauce and was able to drink water from cup without any s/s swallowing difficulty, no coughing. Pt at this time still oriented only to self, but cooperative and pleasant. Still talking about being dead and dying. Keeps saying, "I'm sorry to bother you guys but I guess you need a job." Pt able to recall names of his physicians, and when daughter explained that he would be having an EGD tomorrow to stretch his esophagus, pt states, "Well, its about time. That's when they put the lighted tube down your throat, right?"

## 2020-09-24 NOTE — Progress Notes (Signed)
*  PRELIMINARY RESULTS* Echocardiogram 2D Echocardiogram limited with definity has been performed.  [name] 09/24/2020, 12:29 PM

## 2020-09-24 NOTE — Progress Notes (Signed)
    Asked to review the patient's chart by GI. He is currently admitted for acute blood loss anemia and reported melena with plans for an upcoming EGD. HS Troponin values at 112 and 117 on admission. The patient has a known cardiomyopathy with EF of 45% by echocardiogram in 07/2020. A Lexiscan Myoview was recommended by Dr. Johnsie Cancel but not yet performed. He did have a NST in 06/2019 which was low risk. He was most recently admitted to [facility] from 2/9 - 09/19/2020 for bradycardia and Cardizem was discontinued with plans for an outpatient monitor and had recently suffered from COVID-19 pneumonia..  Will plan to obtain a limited echocardiogram for reassessment of EF and wall motion. If overall stable when compared to prior imaging, would not anticipate further ischemic testing in the setting of his anemia and melena. His Eliquis is currently held.  Signed, Erma Heritage, PA-C 09/24/2020, 11:23 AM Pager: [phone]

## 2020-09-24 NOTE — Progress Notes (Signed)
Entered patients room with nurse tech Pam to replace tele monitor. Patient swung tele box and the nurse tech and started yelling that we were holding him hostage. Patient begins yelling "Freedom" and swings when we attempt to place gown and monitor back on. Shanon Brow made aware. New order for haldol.

## 2020-09-25 ENCOUNTER — Other Ambulatory Visit: Payer: Self-pay | Admitting: Nurse Practitioner

## 2020-09-25 ENCOUNTER — Encounter (HOSPITAL_COMMUNITY)
Admission: EM | Disposition: A | Discharge status: Hospice | Payer: Self-pay | Source: Home / Self Care | Attending: Family Medicine

## 2020-09-25 DIAGNOSIS — Z7189 Other specified counseling: Secondary | ICD-10-CM | POA: Diagnosis not present

## 2020-09-25 DIAGNOSIS — R531 Weakness: Secondary | ICD-10-CM

## 2020-09-25 DIAGNOSIS — I5032 Chronic diastolic (congestive) heart failure: Secondary | ICD-10-CM | POA: Diagnosis not present

## 2020-09-25 DIAGNOSIS — D62 Acute posthemorrhagic anemia: Secondary | ICD-10-CM | POA: Diagnosis not present

## 2020-09-25 DIAGNOSIS — K921 Melena: Secondary | ICD-10-CM | POA: Diagnosis not present

## 2020-09-25 DIAGNOSIS — R1319 Other dysphagia: Secondary | ICD-10-CM

## 2020-09-25 DIAGNOSIS — D649 Anemia, unspecified: Secondary | ICD-10-CM

## 2020-09-25 DIAGNOSIS — N183 Chronic kidney disease, stage 3 unspecified: Secondary | ICD-10-CM

## 2020-09-25 LAB — CBC
HCT: 28.3 % — ABNORMAL LOW (ref 39.0–52.0)
Hemoglobin: 8.8 g/dL — ABNORMAL LOW (ref 13.0–17.0)
MCH: 28.8 pg (ref 26.0–34.0)
MCHC: 31.1 g/dL (ref 30.0–36.0)
MCV: 92.5 fL (ref 80.0–100.0)
Platelets: 198 10*3/uL (ref 150–400)
RBC: 3.06 MIL/uL — ABNORMAL LOW (ref 4.22–5.81)
RDW: 15.8 % — ABNORMAL HIGH (ref 11.5–15.5)
WBC: 8.2 10*3/uL (ref 4.0–10.5)
nRBC: 0.4 % — ABNORMAL HIGH (ref 0.0–0.2)

## 2020-09-25 LAB — BASIC METABOLIC PANEL
Anion gap: 11 (ref 5–15)
BUN: 73 mg/dL — ABNORMAL HIGH (ref 8–23)
CO2: 32 mmol/L (ref 22–32)
Calcium: 8.4 mg/dL — ABNORMAL LOW (ref 8.9–10.3)
Chloride: 99 mmol/L (ref 98–111)
Creatinine, Ser: 1.45 mg/dL — ABNORMAL HIGH (ref 0.61–1.24)
GFR, Estimated: 45 mL/min — ABNORMAL LOW (ref >60)
Glucose, Bld: 110 mg/dL — ABNORMAL HIGH (ref 70–99)
Potassium: 3.5 mmol/L (ref 3.5–5.1)
Sodium: 142 mmol/L (ref 135–145)

## 2020-09-25 IMAGING — CR PORTABLE CHEST - 1 VIEW
1 series · 1 of 1 positions shown · non-contrast
Comparison: Radiograph 01/12/2015

CLINICAL DATA: Chest pain.

EXAM:
PORTABLE CHEST 1 VIEW

[portable]
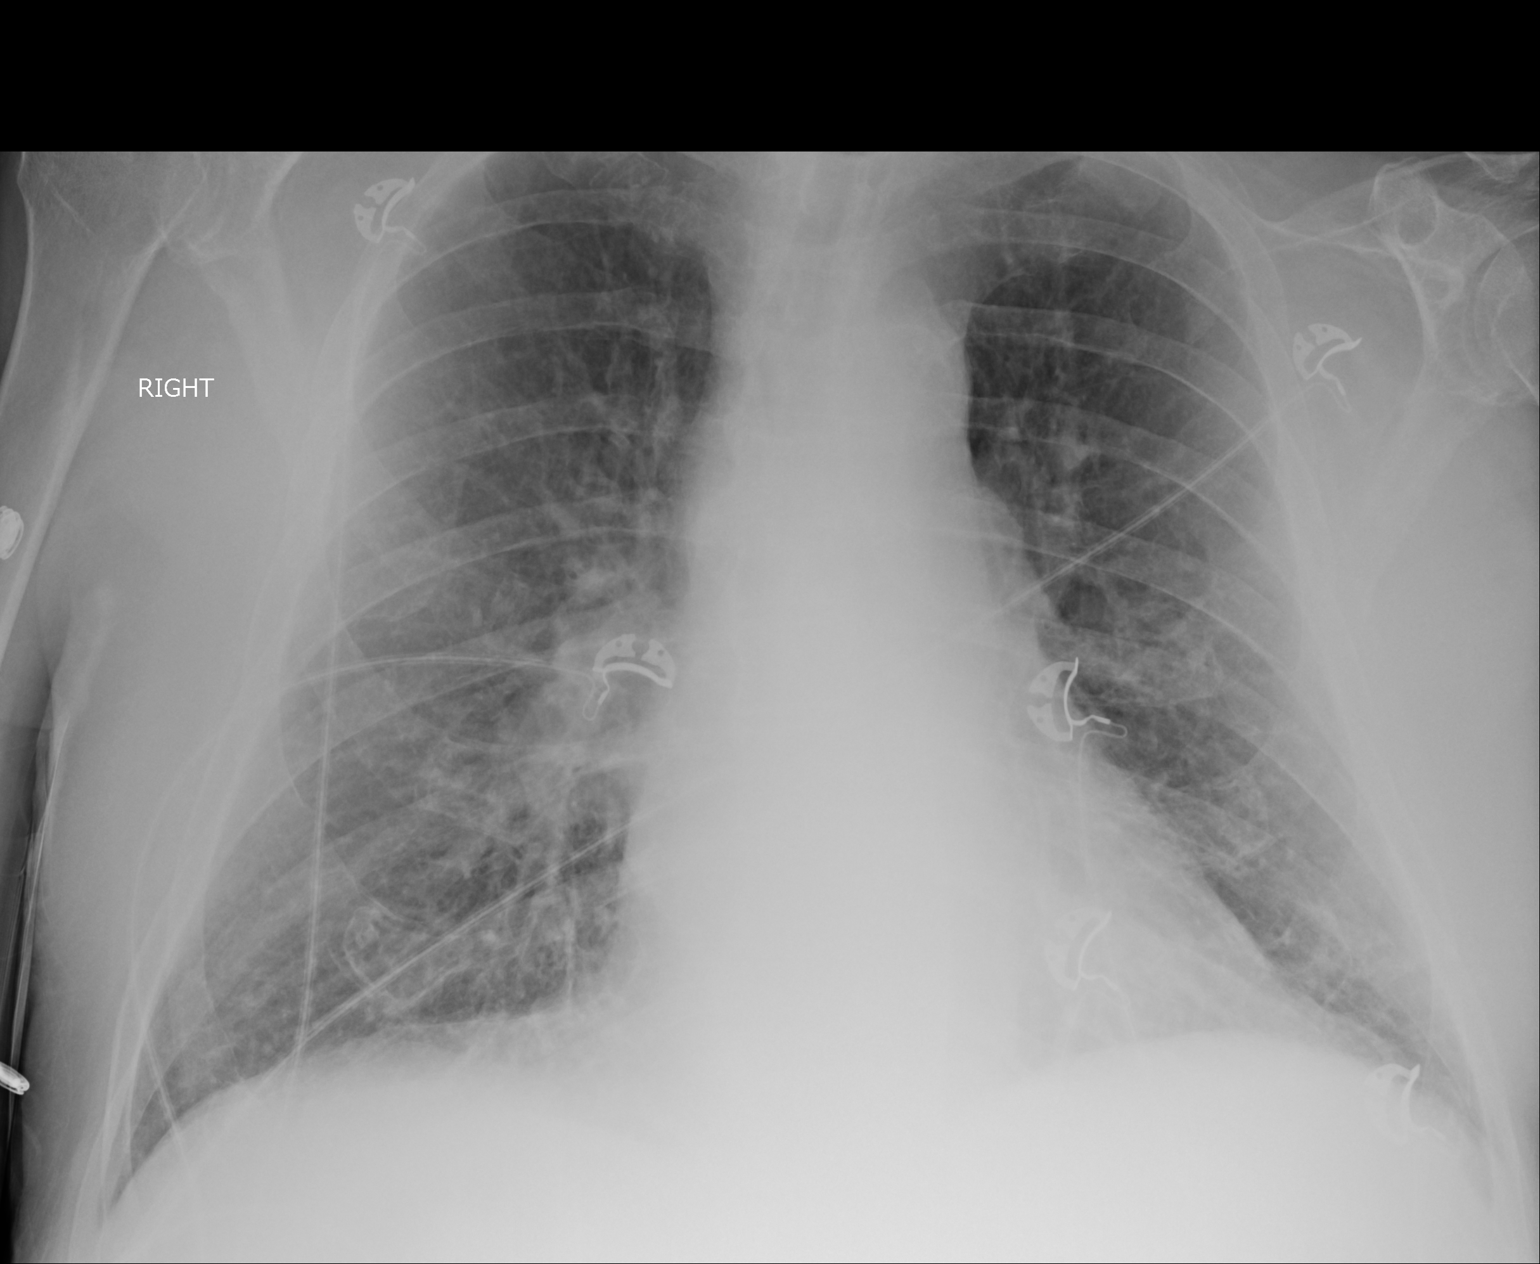

[1 of 1 positions shown; findings below may reference images not displayed]

FINDINGS: Chronic hyperinflation. Upper normal heart size with unchanged
mediastinal contours. Vascular congestion. New interstitial and
bronchial thickening. No confluent airspace disease. No large
pleural effusion. No pneumothorax.
IMPRESSION: 1. Vascular congestion and suggestion of mild pulmonary edema.
2. Chronic hyperinflation.

## 2020-09-25 SURGERY — ESOPHAGOGASTRODUODENOSCOPY (EGD) WITH PROPOFOL
Anesthesia: Choice

## 2020-09-25 NOTE — Care Management Important Message (Signed)
Important Message  Patient Details  Name: David Acosta MRN: [id number] Date of Birth: 06/16/1926   Medicare Important Message Given:  Yes     Tommy Medal 09/25/2020, 1:24 PM

## 2020-09-25 NOTE — Evaluation (Signed)
Physical Therapy Evaluation Patient Details Name: David Acosta MRN: [id number] DOB: 30-Jul-1926 Today's Date: 09/25/2020   History of Present Illness  David Acosta is a 87 y.o. male with medical history significant of unspecified skin cancer, dysphagia, glaucoma, hypertension, macular degeneration, chronic diastolic heart failure, PVCs, unspecified atrial fibrillation, stage IIIb CKD, recently discharged from [facility] (09/19/2020) due to acute combined diastolic and systolic HF who is coming to the emergency department due to weakness, decreased appetite and heartburn sensation.  He has also felt lightheaded.  The family also told the EMS crew that his mental status was altered, but the patient is oriented now in the ED.  He denies fever, chills, sore throat or rhinorrhea.  He denies productive cough, chest pain, palpitations, diaphoresis, PND orthopnea.  Denies nausea, emesis, diarrhea, constipation, dysuria, frequency or hematuria.  No polyuria, polydipsia, polyphagia or blurred vision.    Clinical Impression  Patient limited for functional mobility as stated below secondary to BLE weakness, fatigue and impaired standing balance. Patient requires assist to upright trunk and scoot to EOB with intermittent cueing for sequencing. Patient demonstrates good sitting balance and sitting tolerance EOB. Attempted to transfer to standing without AD and assist but patient unsteady and unable to secondary to LE weakness. Patient able to transfer to standing with RW with minimal assist for impaired strength. Patient shows good standing balance and tolerance with use of RW. He ambulates to chair and finishes session seated in chair. Patient will benefit from continued physical therapy in hospital and recommended venue below to increase strength, balance, endurance for safe ADLs and gait.     Follow Up Recommendations Home health PT;Supervision for mobility/OOB;Supervision/Assistance - 24 hour    Equipment  Recommendations  None recommended by PT    Recommendations for Other Services       Precautions / Restrictions Precautions Precautions: Fall Restrictions Weight Bearing Restrictions: No      Mobility  Bed Mobility Overal bed mobility: Needs Assistance Bed Mobility: Supine to Sit     Supine to sit: Min assist Sit to supine: HOB elevated   General bed mobility comments: assist to pull to seated EOB, some cueing for sequencing    Transfers Overall transfer level: Needs assistance   Transfers: Sit to/from Stand Sit to Stand: Min assist         General transfer comment: transfer to standing without AD attempted but unsuccessful, able to transfer to standing with RW with min assist and min verbal cueing for sequencing  Ambulation/Gait Ambulation/Gait assistance: Min guard Gait Distance (Feet): 4 Feet Assistive device: Rolling walker (2 wheeled) Gait Pattern/deviations: Decreased stride length;Trunk flexed Gait velocity: decreased   General Gait Details: ambulates to chair with RW, no loss of balance  Stairs            Wheelchair Mobility    Modified Rankin (Stroke Patients Only)       Balance Overall balance assessment: Needs assistance Sitting-balance support: No upper extremity supported Sitting balance-Leahy Scale: Good Sitting balance - Comments: seated EOB   Standing balance support: Bilateral upper extremity supported Standing balance-Leahy Scale: Fair Standing balance comment: good/fair with use of RW                             Pertinent Vitals/Pain Pain Assessment: No/denies pain    Home Living Family/patient expects to be discharged to:: Private residence Living Arrangements: Children (son) Available Help at Discharge: Family;Available 24 hours/day Type of  Home: House Home Access: Stairs to enter Entrance Stairs-Rails: Psychiatric nurse of Steps: 2 Home Layout: One level Home Equipment: Photographer - 4 wheels;Cane - single point      Prior Function Level of Independence: Needs assistance   Gait / Transfers Assistance Needed: Uses Rollator for ambulation, states short community distance ambulation  ADL's / Homemaking Assistance Needed: family assists  Comments: does not drive     Hand Dominance        Extremity/Trunk Assessment   Upper Extremity Assessment Upper Extremity Assessment: Overall WFL for tasks assessed    Lower Extremity Assessment Lower Extremity Assessment: Generalized weakness    Cervical / Trunk Assessment Cervical / Trunk Assessment: Normal  Communication   Communication: No difficulties  Cognition Arousal/Alertness: Awake/alert Behavior During Therapy: WFL for tasks assessed/performed Overall Cognitive Status: Within Functional Limits for tasks assessed                                        General Comments      Exercises     Assessment/Plan    PT Assessment Patient needs continued PT services  PT Problem List Decreased strength;Decreased mobility;Decreased activity tolerance;Cardiopulmonary status limiting activity;Decreased balance;Decreased knowledge of use of DME       PT Treatment Interventions DME instruction;Therapeutic activities;Gait training;Therapeutic exercise;Patient/family education;Balance training;Stair training;Functional mobility training    PT Goals (Current goals can be found in the Care Plan section)  Acute Rehab PT Goals Patient Stated Goal: return home PT Goal Formulation: With patient Time For Goal Achievement: 10/09/20 Potential to Achieve Goals: Good    Frequency Min 3X/week   Barriers to discharge        Co-evaluation               AM-PAC PT "6 Clicks" Mobility  Outcome Measure Help needed turning from your back to your side while in a flat bed without using bedrails?: A Little Help needed moving from lying on your back to sitting on the side of a flat bed without  using bedrails?: A Little Help needed moving to and from a bed to a chair (including a wheelchair)?: A Little Help needed standing up from a chair using your arms (e.g., wheelchair or bedside chair)?: A Little Help needed to walk in hospital room?: A Little Help needed climbing 3-5 steps with a railing? : A Lot 6 Click Score: 17    End of Session Equipment Utilized During Treatment: Gait belt Activity Tolerance: Patient tolerated treatment well Patient left: in chair;with chair alarm set;with call bell/phone within reach Nurse Communication: Mobility status PT Visit Diagnosis: Unsteadiness on feet (R26.81);Other abnormalities of gait and mobility (R26.89);Muscle weakness (generalized) (M62.81)    Time: 9476-5465 PT Time Calculation (min) (ACUTE ONLY): 37 min   Charges:   PT Evaluation $PT Eval Moderate Complexity: 1 Mod PT Treatments $Therapeutic Activity: 23-37 mins        10:06 AM, 09/25/20 Mearl Latin PT, DPT Physical Therapist at [facility]

## 2020-09-25 NOTE — TOC Progression Note (Signed)
Transition of Care [facility]) - Progression Note   Patient Details  Name: David Acosta MRN: [id number] Date of Birth: 24-Dec-1925  Transition of Care [facility]) CM/SW [place], LCSW Phone Number: 09/25/2020, 3:27 PM  Clinical Narrative: Patient's family requesting home hospice services. CSW spoke with patient's daughter, [name]. Family requested [facility] for hospice as patient and his wife are already active with OP palliative services. Daughter unsure of DME needs at this time. CSW made referral to [place] with [facility]. Per Chrislyn, family is undecided on DME needs and may discharge back to son's home with DME to be set up after discharge. TOC to follow.  Expected Discharge Plan: Home w Hospice Care Barriers to Discharge: Continued Medical Work up  Expected Discharge Plan and Services Expected Discharge Plan: Home w Hospice Care In-house Referral: Clinical Social Work Discharge Planning Services: NA Post Acute Care Choice: Hospice Living arrangements for the past 2 months: Single Family Home              DME Arranged: N/A DME Agency: NA HH Arranged: PT HH Agency: [facility]  Readmission Risk Interventions Readmission Risk Prevention Plan 09/24/2020  Transportation Screening Complete  HRI or Home Care Consult Complete  Social Work Consult for [place] Planning/Counseling Complete  Palliative Care Screening Not Applicable  Medication Review Press photographer) Complete  Some recent data might be hidden

## 2020-09-25 NOTE — Progress Notes (Signed)
UNMATCHED BLOOD PRODUCT NOTE  Compare the patient ID on the blood tag to the patient ID on the hospital armband and Blood Bank armband. Then confirm the unit number on the blood tag matches the unit number on the blood product.  If a discrepancy is discovered return the product to blood bank immediately.   Blood Product Type: Packed Red Blood Cells  Unit #: [id number]  Product Code #: [id number]   Start Time: 1962  Starting Rate: 120 ml/hr  Rate increase/decreased  (if applicable):      Ml/hr   Rate changed time (if applicable):    Stop Time: 0320   All Other Documentation should be documented within the Blood Admin Flowsheet per policy.

## 2020-09-25 NOTE — Progress Notes (Signed)
Subjective: Doing better today. No nausea, no abdominal pain. Is wanting ice water. No other overt GI complaints. Patient's son is at the bedside.  Objective: Vital signs in last 24 hours: Temp:  [97.5 F (36.4 C)-98.6 F (37 C)] 97.8 F (36.6 C) (02/18 0523) Pulse Rate:  [59-99] 88 (02/18 0523) Resp:  [16-18] 18 (02/18 0523) BP: (91-115)/(52-66) 115/65 (02/18 0523) SpO2:  [84 %-95 %] 93 % (02/18 0523) Last BM Date: 09/24/20 General:   Alert and pleasant Head:  Normocephalic and atraumatic. Eyes:  No icterus, sclera clear. Conjuctiva pink.  Heart:  S1, S2 present, no murmurs noted.  Lungs: Clear to auscultation bilaterally, without wheezing, rales, or rhonchi.  Abdomen:  Bowel sounds present, soft, non-tender, non-distended. No HSM or hernias noted. No rebound or guarding. Msk:  Symmetrical without gross deformities. Extremities:  Without clubbing or edema. Psych:  Alert and cooperative. Normal mood and affect.  Intake/Output from previous day: 02/17 0701 - 02/18 0700 In: 596.1 [P.O.:315; I.V.:281.1] Out: -  Intake/Output this shift: No intake/output data recorded.  Lab Results: Recent Labs    09/23/20 2142 09/24/20 0452 09/24/20 1543 09/25/20 0505  WBC 9.3 8.1  --  8.2  HGB 8.0* 7.4* 6.4* 8.8*  HCT 25.4* 23.8* 20.9* 28.3*  PLT 212 218  --  198   BMET Recent Labs    09/23/20 2142 09/24/20 0452 09/25/20 0505  NA 136 140 142  K 3.9 3.9 3.5  CL 96* 95* 99  CO2 31 34* 32  GLUCOSE 110* 99 110*  BUN 57* 61* 73*  CREATININE 1.61* 1.52* 1.45*  CALCIUM 8.5* 8.8* 8.4*   LFT Recent Labs    09/23/20 2142 09/24/20 0452  PROT 5.9* 5.5*  ALBUMIN 2.9* 2.8*  AST 24 23  ALT 29 27  ALKPHOS 55 51  BILITOT 0.8 0.9   PT/INR Recent Labs    09/23/20 2142  LABPROT 20.0*  INR 1.8*   Hepatitis Panel No results for input(s): HEPBSAG, HCVAB, HEPAIGM, HEPBIGM in the last 72 hours.   Studies/Results: CT HEAD WO CONTRAST  Result Date: 09/23/2020 CLINICAL  DATA:  Weakness, altered level of consciousness EXAM: CT HEAD WITHOUT CONTRAST TECHNIQUE: Contiguous axial images were obtained from the base of the skull through the vertex without intravenous contrast. COMPARISON:  12/22/2009 FINDINGS: Brain: No acute infarct or hemorrhage. There is mild diffuse cerebral atrophy, age-appropriate. Lateral ventricles and midline structures are unremarkable. No acute extra-axial fluid collections. No mass effect. Vascular: No hyperdense vessel or unexpected calcification. Skull: Normal. Negative for fracture or focal lesion. Sinuses/Orbits: Postsurgical changes from right mastoidectomy. Chronic bilateral mastoid effusions. Paranasal sinuses are clear. Other: None. IMPRESSION: 1. Age-appropriate cerebral atrophy.  No acute intracranial process. Electronically Signed   By: Randa Ngo M.D.   On: 09/23/2020 21:19   ECHOCARDIOGRAM LIMITED  Result Date: 09/24/2020    ECHOCARDIOGRAM LIMITED REPORT   Patient Name:   David Acosta Date of Exam: 09/24/2020 Medical Rec #:  [id number]       Height:       69.0 in Accession #:    [id number]      Weight:       174.6 lb Date of Birth:  Jul 05, 1926      BSA:          1.950 m Patient Age:    16 years        BP:           102/56 mmHg Patient Gender: M  HR:           95 bpm. Exam Location:  [facility] Procedure: Limited Echo and Intracardiac Opacification Agent Indications:    Cardiomyopathy-Unspecified I42.9  History:        Patient has prior history of Echocardiogram examinations, most                 recent 08/04/2020. CHF, Arrythmias:Atrial Fibrillation,                 Signs/Symptoms:Chest Pain; Risk Factors:Hypertension and Former                 Smoker. Elevated Troponin, Chronic Renal Disease.  Sonographer:    [name] RDCS (AE) Referring Phys: [id number] [place]  1. Limited study with Definity contrast.  2. Left ventricular ejection fraction, by estimation, is 45 to 50%. The left ventricle has  mildly decreased function. The left ventricle demonstrates global hypokinesis. There is mild left ventricular hypertrophy. Left ventricular diastolic parameters are indeterminate in the setting of atrial fibrillation.  3. RV-RA gradient 25 mmHg. Right ventricular systolic function is moderately reduced. The right ventricular size is mildly enlarged.  4. Left atrial size was severely dilated.  5. The mitral valve is abnormal, mildly thickened. Mild mitral valve regurgitation.  6. The aortic valve is tricuspid. There is mild calcification of the aortic valve. Aortic valve regurgitation is not visualized.  7. Unable to estimate CVP. FINDINGS  Left Ventricle: Left ventricular ejection fraction, by estimation, is 45 to 50%. The left ventricle has mildly decreased function. The left ventricle demonstrates global hypokinesis. Definity contrast agent was given IV to delineate the left ventricular  endocardial borders. The left ventricular internal cavity size was normal in size. There is mild left ventricular hypertrophy. Left ventricular diastolic parameters are indeterminate. Right Ventricle: RV-RA gradient 25 mmHg. The right ventricular size is mildly enlarged. No increase in right ventricular wall thickness. Right ventricular systolic function is moderately reduced. Left Atrium: Left atrial size was severely dilated. Right Atrium: Right atrial size was normal in size. Pericardium: There is no evidence of pericardial effusion. Mitral Valve: The mitral valve is abnormal. There is mild thickening of the mitral valve leaflet(s). Mild mitral valve regurgitation. Tricuspid Valve: The tricuspid valve is grossly normal. Tricuspid valve regurgitation is mild. Aortic Valve: The aortic valve is tricuspid. There is mild calcification of the aortic valve. There is mild aortic valve annular calcification. Aortic valve regurgitation is not visualized. Pulmonic Valve: The pulmonic valve was grossly normal. Pulmonic valve regurgitation  is trivial. Aorta: The aortic root is normal in size and structure. Venous: Unable to estimate CVP. The inferior vena cava was not well visualized. LEFT VENTRICLE PLAX 2D LVIDd:         4.82 cm  Diastology LVIDs:         3.53 cm  LV e' medial:    6.57 cm/s LV PW:         1.58 cm  LV E/e' medial:  12.1 LV IVS:        1.20 cm  LV e' lateral:   8.60 cm/s LVOT diam:     2.00 cm  LV E/e' lateral: 9.3 LVOT Area:     3.14 cm  LEFT ATRIUM            Index LA diam:      3.00 cm  1.54 cm/m LA Vol (A4C): 102.0 ml 52.30 ml/m   AORTA Ao Root diam: 3.40 cm MITRAL VALVE  TRICUSPID VALVE MV Area (PHT): 3.21 cm    TR Peak grad:   25.0 mmHg MV Decel Time: 236 msec    TR Vmax:        250.00 cm/s MV E velocity: 79.70 cm/s                            SHUNTS                            Systemic Diam: 2.00 cm Rozann Lesches MD Electronically signed by Rozann Lesches MD Signature Date/Time: 09/24/2020/1:20:05 PM    Final     Assessment: 87 year old male with recent admission for acute on chronic heart failure, bradycardia, and mental status changes, A. fib on Eliquis, stage IIIb chronic kidney disease, Barrett's esophagus with indefinite dysplasia presented to the hospital with progressive weakness, decreased oral intake, heartburn sensation and mental status changes.  In the ED it was noted that his hemoglobin had declined since recent admission and he had melena on exam.   Melena/dysphagia: Hemoglobin 9.8 on February 11 down to 7.4 today.  With known Barrett's esophagus, overdue for repeat EGD because of concern for possible dysplasia at time of biopsy in 2019.  Patient has been on Eliquis for his A. fib, last dose at 6 PM on February 16.  Also on iron over the past couple months due to recurrent small-volume rectal bleeding.  No reported NSAID or aspirin use.  Would be concerned about upper GI bleed from possible malignancy, peptic ulcer disease, less likely bleeding from varices or portal gastropathy (CT 2020  with normal-appearing liver, no evidence of advanced liver disease on 2019 EGD).  Patient also complaining of heartburn sensations when he presented.  Troponin elevated, trending downward, per attending felt to be demand ischemia.  Mental status changes: Per daughter, patient had no issues with mental status changes/confusion/behavioral issues prior to admission on February 9.  Hospitalized for 3 days, discharged on February 12.  Has been consistently confused, agitated since that time.  Current head CT unremarkable.  Patient historically drinks multiple alcoholic beverages at night, last drink prior to February 9 admission.  Management per attending.    Elevated troponins: per attending, suspect demand ischemia. Echo completed and as per records. Cardiology cleared him for EGD/dilation with no further cardiac workup  End of Life Care: Patient and family has decided to elect discharge to home with hospice. However, still wanting EGD/dilation for dysphagia and comfort. Last eliquis approximately 36 hours ago. Due to kidney function and nature of esophageal dilation, the decision was made to postpone EGD until Saturday morning. Patient and family are aware.    Plan: 1. Monitor hgb 2. Check CBC and BMP in the morning 3. Transfuse as necessary 4. EGD +/- dilation tomorrow morning 9:00 am 5. Clear liquids today 6. NPO after midnight 7. Supportive care   Thank you for allowing [place] to participate in the care of Casimer Bilis, DNP, AGNP-C Adult & Gerontological Nurse Practitioner [facility]     LOS: 1 day    09/25/2020, 11:00 AM

## 2020-09-25 NOTE — Anesthesia Preprocedure Evaluation (Deleted)
Anesthesia Evaluation  Patient identified by MRN, date of birth, ID band Patient awake and Patient confused    Reviewed: Allergy & Precautions, NPO status , Patient's Chart, lab work & pertinent test results  History of Anesthesia Complications Negative for: history of anesthetic complications  Airway        Dental  (+) Dental Advisory Given   Pulmonary neg pulmonary ROS, former smoker,           Cardiovascular hypertension, Pt. on medications +CHF  + dysrhythmias Atrial Fibrillation      Neuro/Psych negative neurological ROS  negative psych ROS   GI/Hepatic Upper GI bleeding   Endo/Other  negative endocrine ROS  Renal/GU Renal InsufficiencyRenal disease  negative genitourinary   Musculoskeletal   Abdominal   Peds negative pediatric ROS (+)  Hematology  (+) anemia ,   Anesthesia Other Findings   Reproductive/Obstetrics                             Anesthesia Physical Anesthesia Plan  ASA: IV  Anesthesia Plan: General   Post-op Pain Management:    Induction: Intravenous  PONV Risk Score and Plan: TIVA  Airway Management Planned: Nasal Cannula and Natural Airway  Additional Equipment:   Intra-op Plan:   Post-operative Plan:   Informed Consent: I have reviewed the patients History and Physical, chart, labs and discussed the procedure including the risks, benefits and alternatives for the proposed anesthesia with the patient or authorized representative who has indicated his/her understanding and acceptance.     Dental advisory given  Plan Discussed with: CRNA and Surgeon  Anesthesia Plan Comments:         Anesthesia Quick Evaluation

## 2020-09-25 NOTE — Anesthesia Preprocedure Evaluation (Addendum)
Anesthesia Evaluation  Patient identified by MRN, date of birth, ID band Patient awake and Patient confused    Reviewed: Allergy & Precautions, NPO status , Patient's Chart, lab work & pertinent test results  History of Anesthesia Complications Negative for: history of anesthetic complications  Airway Mallampati: III  TM Distance: >3 FB Neck ROM: Full    Dental  (+) Dental Advisory Given, Caps   Pulmonary former smoker,    Pulmonary exam normal breath sounds clear to auscultation       Cardiovascular Exercise Tolerance: Good hypertension, Pt. on medications +CHF  + dysrhythmias Atrial Fibrillation  Rhythm:Irregular Rate:Abnormal  1. Limited study with Definity contrast.  2. Left ventricular ejection fraction, by estimation, is 45 to 50%. The  left ventricle has mildly decreased function. The left ventricle  demonstrates global hypokinesis. There is mild left ventricular  hypertrophy. Left ventricular diastolic parameters  are indeterminate in the setting of atrial fibrillation.  3. RV-RA gradient 25 mmHg. Right ventricular systolic function is  moderately reduced. The right ventricular size is mildly enlarged.  4. Left atrial size was severely dilated.  5. The mitral valve is abnormal, mildly thickened. Mild mitral valve  regurgitation.  6. The aortic valve is tricuspid. There is mild calcification of the  aortic valve. Aortic valve regurgitation is not visualized.  7. Unable to estimate CVP.    Neuro/Psych Dementia negative psych ROS   GI/Hepatic negative GI ROS, Neg liver ROS, Upper GI bleeding.   Endo/Other  negative endocrine ROS  Renal/GU Renal Insufficiency and ARFRenal disease     Musculoskeletal negative musculoskeletal ROS (+)   Abdominal   Peds  Hematology  (+) anemia ,   Anesthesia Other Findings   Reproductive/Obstetrics negative OB ROS                            Anesthesia Physical Anesthesia Plan  ASA: IV  Anesthesia Plan: General   Post-op Pain Management:    Induction: Intravenous  PONV Risk Score and Plan: TIVA  Airway Management Planned: Nasal Cannula and Natural Airway  Additional Equipment:   Intra-op Plan:   Post-operative Plan:   Informed Consent: I have reviewed the patients History and Physical, chart, labs and discussed the procedure including the risks, benefits and alternatives for the proposed anesthesia with the patient or authorized representative who has indicated his/her understanding and acceptance.    Discussed DNR with power of attorney and Discussed DNR with patient.   Consent reviewed with POA  Plan Discussed with:   Anesthesia Plan Comments: (Agreed to receive brief  resuscitation if patients heart stops during the procedure and do not want prolonged resuscitation )        Anesthesia Quick Evaluation

## 2020-09-25 NOTE — Progress Notes (Signed)
Daily Progress Note   Patient Name: David Acosta       Date: 09/25/2020 DOB: Feb 09, 1926  Age: 87 y.o. MRN#: [id number] Attending Physician: Murlean Iba, MD Primary Care Physician: Christain Sacramento, MD Admit Date: 09/23/2020  Reason for Consultation/Follow-up: Establishing goals of care  Subjective: David Acosta is sitting up in the chair today. He is having trouble expectorating thick white mucous. He asks for David Acosta channel on the tv. Does not complain of pain. Having trouble hearing- says he has an old hearing aid on.  Spoke with daughter David Acosta. After family discussion they would like to proceed with EGD and esophageal dilation in attempts to provide some comfort with his ability to eat. After that they would like to arrange for discharge home with Hospice.   Review of Systems  Unable to perform ROS: Other    Length of Stay: 1  Current Medications: Scheduled Meds:  . sodium chloride   Intravenous Once  . sodium chloride   Intravenous Once  . hydrocortisone  25 mg Rectal BID  . latanoprost  1 drop Both Eyes QHS  . levothyroxine  25 mcg Oral Q0600  . pantoprazole (PROTONIX) IV  40 mg Intravenous Q12H  . QUEtiapine  25 mg Oral QHS    Continuous Infusions: . sodium chloride 60 mL/hr at 09/25/20 0339    PRN Meds: acetaminophen **OR** acetaminophen, albuterol, ALPRAZolam, ondansetron **OR** ondansetron (ZOFRAN) IV  Physical Exam Vitals and nursing note reviewed.  Pulmonary:     Effort: Pulmonary effort is normal.             Vital Signs: BP 115/65 (BP Location: Left Arm)   Pulse 88   Temp 97.8 F (36.6 C) (Axillary)   Resp 18   Ht 5\' 9"  (1.753 m)   Wt 79.2 kg   SpO2 93%   BMI 25.78 kg/m  SpO2: SpO2: 93 % O2 Device: O2 Device: Nasal Cannula O2 Flow Rate: O2  Flow Rate (L/min): 3 L/min  Intake/output summary:   Intake/Output Summary (Last 24 hours) at 09/25/2020 0959 Last data filed at 09/25/2020 0600 Gross per 24 hour  Intake 596.05 ml  Output --  Net 596.05 ml   LBM: Last BM Date: 09/24/20 Baseline Weight: Weight: 79.2 kg Most recent weight: Weight: 79.2 kg  Palliative Assessment/Data: PPS: 20%     Patient Active Problem List   Diagnosis Date Noted  . Chronic renal disease, stage 3b ([place])   . Melena 09/23/2020  . Hypoalbuminemia 09/23/2020  . Acute blood loss anemia 09/23/2020  . Glaucoma   . Elevated troponin   . Acute CHF (congestive heart failure) ([place]) 09/16/2020  . CHF (congestive heart failure) ([place]) 09/16/2020  . Symptomatic bradycardia   . Hyponatremia   . Chest pain 03/24/2019  . Acute diastolic CHF (congestive heart failure) ([place]) 03/24/2019  . Unspecified atrial fibrillation ([place]) 03/24/2019  . Hypokalemia 03/24/2019  . Chronic diastolic heart failure ([place]) 03/24/2019  . Dysphagia 10/05/2017  . Esophageal dysphagia 10/05/2017  . PVC (premature ventricular contraction) 03/12/2013  . HTN (hypertension) 03/12/2013    Palliative Care Assessment & Plan   Patient Profile: 87 y.o. 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 intake, lightheaded altered mental status. Work-up has revealed GI bleed with anemia down to 6.4. Patient was scheduled for a palliative evaluation as an outpatient however was readmitted before that could be completed. Palliative now consulted for goals of care.  Assessment/Recommendations/Plan   Plan for EGD  Referral for Hospice at discharge  Goals of Care and Additional  Recommendations:  Limitations on Scope of Treatment: No Artificial Feeding  Code Status:  DNR  Prognosis:   < 6 months  Discharge Planning:  Home with Hospice  Care plan was discussed with patient's daughter and care team.  Thank you for allowing the Palliative Medicine Team to assist in the care of this patient.   Total time: 38 minutes Greater than 50%  of this time was spent counseling and coordinating care related to the above assessment and plan.  Mariana Kaufman, AGNP-C Palliative Medicine   Please contact Palliative Medicine Team phone at [phone] for questions and concerns.

## 2020-09-25 NOTE — Progress Notes (Signed)
PROGRESS NOTE   David Acosta  HFW:[id number] DOB: [birth year] DOA: 09/23/2020 PCP: David Sacramento, MD   Chief Complaint  Patient presents with  . Weakness   Level of care: Med-Surg  Brief Admission History:  87 y.o. male with medical history significant of unspecified skin cancer, dysphagia, glaucoma, hypertension, macular degeneration, chronic diastolic heart failure, PVCs, unspecified atrial fibrillation, stage IIIb CKD, recently discharged from [facility] (09/19/2020) due to acute combined diastolic and systolic HF who is coming to the emergency department due to weakness, decreased appetite and heartburn sensation.  He has also felt lightheaded.  The family also told the EMS crew that his mental status was altered, but the patient is oriented now in the ED.  He denies fever, chills, sore throat or rhinorrhea.  Assessment & Plan:   Principal Problem:   Melena Active Problems:   HTN (hypertension)   Unspecified atrial fibrillation (HCC)   Chronic diastolic heart failure (HCC)   Hypoalbuminemia   Acute blood loss anemia   Glaucoma   Elevated troponin   Chronic renal disease, stage 3b (HCC)   Symptomatic anemia  1. Melena/acute blood loss anemia -continue IV protonix, holding home apixaban, type and screen, appreciate GI evaluation. Plan EGD in AM.  Anesthesia asked for IV fluids.   2. Essential hypertension - temporarily holding home torsemide, would resume after EGD.  3. Persistent atrial fibrillation - holding home apixaban in setting of anemia/GI bleeding.  4. Elevated troponin - attributed to demand ischemia in setting of anemia.  5. Chronic combined heart failure - stable for now, following.  Cardiology planning limited echocardiogram to be done as work up prior to EGD.   6. Stage 3a CKD - stable.  7. Glaucoma - resumed home therapy.  8. Hypoalbuminemia - dietitian consultation requested.   9. Goals of care: discussed with daughter and she says that he had outpatient palliative  care consult planned for 2/18 and would like him seen in hospital if possible.  Unfortunately he has had a precipitous decline in the last month and they worry about being able to care for him at home.  Family is planning return home tomorrow after EGD.   10. Dementia - Resume home seroquel QHS.  Haldol was ordered as needed for severe agitation.   DVT prophylaxis: SCDs Code Status: DNR  Family Communication: daughter David Acosta updated  Disposition: TBD (requesting PT evaluation) Status is: Inpatient  Remains inpatient appropriate because:IV treatments appropriate due to intensity of illness or inability to take PO and Inpatient level of care appropriate due to severity of illness  Dispo: The patient is from: Home              Anticipated d/c is to: TBD              Anticipated d/c date is: 2 days              Patient currently is not medically stable to d/c.   Difficult to place patient No  Consultants:   GI   Procedures:   tentative  Antimicrobials:   Subjective: Pt is confused today.  He has no specific complaints.   Objective: Vitals:   09/25/20 0105 09/25/20 0331 09/25/20 0400 09/25/20 0523  BP: 108/62 103/66  115/65  Pulse: 98 93 61 88  Resp: 18 16  18   Temp: 98.5 F (36.9 C) 97.8 F (36.6 C)  97.8 F (36.6 C)  TempSrc: Oral Oral  Axillary  SpO2: 94% (!) 88% 94% 93%  Weight:      Height:        Intake/Output Summary (Last 24 hours) at 09/25/2020 1330 Last data filed at 09/25/2020 1044 Gross per 24 hour  Intake 356.05 ml  Output -  Net 356.05 ml   Filed Weights   09/23/20 2024  Weight: 79.2 kg   Examination:  General exam: frail, elderly male, awake, alert, confused, Appears calm and comfortable  Respiratory system: Clear to auscultation. Respiratory effort normal. Cardiovascular system: normal S1 & S2 heard. No JVD, murmurs, rubs, gallops or clicks. No pedal edema. Gastrointestinal system: Abdomen is nondistended, soft and nontender. No organomegaly or  masses felt. Normal bowel sounds heard. Central nervous system: Alert and oriented to person and place. No focal neurological deficits. Extremities: Symmetric 5 x 5 power. Skin: No rashes, lesions or ulcers.   Psychiatry: Judgement and insight appear poor. Mood & affect appropriate.   Data Reviewed: I have personally reviewed following labs and imaging studies  CBC: Recent Labs  Lab 09/18/20 1737 09/23/20 2142 09/24/20 0452 09/24/20 1543 09/25/20 0505  WBC 7.7 9.3 8.1  --  8.2  HGB 9.8* 8.0* 7.4* 6.4* 8.8*  HCT 31.0* 25.4* 23.8* 20.9* 28.3*  MCV 94.2 92.7 93.0  --  92.5  PLT 311 212 218  --  259    Basic Metabolic Panel: Recent Labs  Lab 09/18/20 1737 09/19/20 0415 09/23/20 2142 09/24/20 0452 09/25/20 0505  NA 129* 131* 136 140 142  K 4.9 4.8 3.9 3.9 3.5  CL 91* 93* 96* 95* 99  CO2 27 30 31  34* 32  GLUCOSE 126* 110* 110* 99 110*  BUN 33* 35* 57* 61* 73*  CREATININE 1.92* 1.80* 1.61* 1.52* 1.45*  CALCIUM 8.3* 8.6* 8.5* 8.8* 8.4*  MG 2.4  --   --   --   --   PHOS 4.0  --   --   --   --     GFR: Estimated Creatinine Clearance: 32.5 mL/min (A) (by C-G formula based on SCr of 1.45 mg/dL (H)).  Liver Function Tests: Recent Labs  Lab 09/18/20 1737 09/23/20 2142 09/24/20 0452  AST 71* 24 23  ALT 52* 29 27  ALKPHOS 101 55 51  BILITOT 0.9 0.8 0.9  PROT 6.5 5.9* 5.5*  ALBUMIN 2.9* 2.9* 2.8*    CBG: Recent Labs  Lab 09/18/20 2353 09/19/20 0433 09/19/20 0821 09/19/20 1019 09/19/20 1217  GLUCAP 142* 115* 89 115* 95    Recent Results (from the past 240 hour(s))  Blood Culture (routine x 2)     Status: None   Collection Time: 09/16/20 12:38 PM   Specimen: BLOOD  Result Value Ref Range Status   Specimen Description BLOOD RIGHT ANTECUBITAL  Final   Special Requests   Final    BOTTLES DRAWN AEROBIC AND ANAEROBIC Blood Culture results may not be optimal due to an excessive volume of blood received in culture bottles   Culture   Final    NO GROWTH 5  DAYS Performed at [facility], [place] [address] [place], [place] [address]    Report Status 09/21/2020 FINAL  Final  Blood Culture (routine x 2)     Status: None   Collection Time: 09/16/20 12:44 PM   Specimen: BLOOD RIGHT WRIST  Result Value Ref Range Status   Specimen Description BLOOD RIGHT WRIST  Final   Special Requests   Final    BOTTLES DRAWN AEROBIC AND ANAEROBIC Blood Culture adequate volume   Culture   Final  NO GROWTH 5 DAYS Performed at [facility], [place] [address] [place], [place] [address]    Report Status 09/21/2020 FINAL  Final  Resp Panel by RT-PCR (Flu A&B, Covid) Nasopharyngeal Swab     Status: None   Collection Time: 09/16/20 12:51 PM   Specimen: Nasopharyngeal Swab; Nasopharyngeal(NP) swabs in vial transport medium  Result Value Ref Range Status   SARS Coronavirus 2 by RT PCR NEGATIVE NEGATIVE Final    Comment: (NOTE) SARS-CoV-2 target nucleic acids are NOT DETECTED.  The SARS-CoV-2 RNA is generally detectable in upper respiratory specimens during the acute phase of infection. The lowest concentration of SARS-CoV-2 viral copies this assay can detect is 138 copies/mL. A negative result does not preclude SARS-Cov-2 infection and should not be used as the sole basis for treatment or other patient management decisions. A negative result may occur with  improper specimen collection/handling, submission of specimen other than nasopharyngeal swab, presence of viral mutation(s) within the areas targeted by this assay, and inadequate number of viral copies(<138 copies/mL). A negative result must be combined with clinical observations, patient history, and epidemiological information. The expected result is Negative.  Fact Sheet for Patients:  EntrepreneurPulse.com.au  Fact Sheet for Healthcare Providers:  IncredibleEmployment.be  This test is no t yet approved or cleared by the [place] FDA and  has been  authorized for detection and/or diagnosis of SARS-CoV-2 by FDA under an Emergency Use Authorization (EUA). This EUA will remain  in effect (meaning this test can be used) for the duration of the COVID-19 declaration under Section 564(b)(1) of the Act, 21 U.S.C.section 360bbb-3(b)(1), unless the authorization is terminated  or revoked sooner.       Influenza A by PCR NEGATIVE NEGATIVE Final   Influenza B by PCR NEGATIVE NEGATIVE Final    Comment: (NOTE) The Xpert Xpress SARS-CoV-2/FLU/RSV plus assay is intended as an aid in the diagnosis of influenza from Nasopharyngeal swab specimens and should not be used as a sole basis for treatment. Nasal washings and aspirates are unacceptable for Xpert Xpress SARS-CoV-2/FLU/RSV testing.  Fact Sheet for Patients: EntrepreneurPulse.com.au  Fact Sheet for Healthcare Providers: IncredibleEmployment.be  This test is not yet approved or cleared by the [place] FDA and has been authorized for detection and/or diagnosis of SARS-CoV-2 by FDA under an Emergency Use Authorization (EUA). This EUA will remain in effect (meaning this test can be used) for the duration of the COVID-19 declaration under Section 564(b)(1) of the Act, 21 U.S.C. section 360bbb-3(b)(1), unless the authorization is terminated or revoked.  Performed at [facility], [place] [address], [place], [place] [address]   SARS CORONAVIRUS 2 (TAT 6-24 HRS) Nasopharyngeal Nasopharyngeal Swab     Status: None   Collection Time: 09/23/20 11:45 PM   Specimen: Nasopharyngeal Swab  Result Value Ref Range Status   SARS Coronavirus 2 NEGATIVE NEGATIVE Final    Comment: (NOTE) SARS-CoV-2 target nucleic acids are NOT DETECTED.  The SARS-CoV-2 RNA is generally detectable in upper and lower respiratory specimens during the acute phase of infection. Negative results do not preclude SARS-CoV-2 infection, do not rule out co-infections with other  pathogens, and should not be used as the sole basis for treatment or other patient management decisions. Negative results must be combined with clinical observations, patient history, and epidemiological information. The expected result is Negative.  Fact Sheet for Patients: SugarRoll.be  Fact Sheet for Healthcare Providers: https://www.woods-mathews.com/  This test is not yet approved or cleared by the [place]  FDA and  has been authorized for detection and/or diagnosis of SARS-CoV-2 by FDA under an Emergency Use Authorization (EUA). This EUA will remain  in effect (meaning this test can be used) for the duration of the COVID-19 declaration under Se ction 564(b)(1) of the Act, 21 U.S.C. section 360bbb-3(b)(1), unless the authorization is terminated or revoked sooner.  Performed at [facility], [place] [address], [place], [place] [address]      Radiology Studies: CT HEAD WO CONTRAST  Result Date: 09/23/2020 CLINICAL DATA:  Weakness, altered level of consciousness EXAM: CT HEAD WITHOUT CONTRAST TECHNIQUE: Contiguous axial images were obtained from the base of the skull through the vertex without intravenous contrast. COMPARISON:  12/22/2009 FINDINGS: Brain: No acute infarct or hemorrhage. There is mild diffuse cerebral atrophy, age-appropriate. Lateral ventricles and midline structures are unremarkable. No acute extra-axial fluid collections. No mass effect. Vascular: No hyperdense vessel or unexpected calcification. Skull: Normal. Negative for fracture or focal lesion. Sinuses/Orbits: Postsurgical changes from right mastoidectomy. Chronic bilateral mastoid effusions. Paranasal sinuses are clear. Other: None. IMPRESSION: 1. Age-appropriate cerebral atrophy.  No acute intracranial process. Electronically Signed   By: Randa Ngo M.D.   On: 09/23/2020 21:19   ECHOCARDIOGRAM LIMITED  Result Date: 09/24/2020    ECHOCARDIOGRAM LIMITED  REPORT   Patient Name:   David Acosta Date of Exam: 09/24/2020 Medical Rec #:  [id number]       Height:       69.0 in Accession #:    [id number]      Weight:       174.6 lb Date of Birth:  [birth year]      BSA:          1.950 m Patient Age:    69 years        BP:           102/56 mmHg Patient Gender: M               HR:           95 bpm. Exam Location:  [facility] Procedure: Limited Echo and Intracardiac Opacification Agent Indications:    Cardiomyopathy-Unspecified I42.9  History:        Patient has prior history of Echocardiogram examinations, most                 recent 08/04/2020. CHF, Arrythmias:Atrial Fibrillation,                 Signs/Symptoms:Chest Pain; Risk Factors:Hypertension and Former                 Smoker. Elevated Troponin, Chronic Renal Disease.  Sonographer:    [name] RDCS (AE) Referring Phys: [id number] [place]  1. Limited study with Definity contrast.  2. Left ventricular ejection fraction, by estimation, is 45 to 50%. The left ventricle has mildly decreased function. The left ventricle demonstrates global hypokinesis. There is mild left ventricular hypertrophy. Left ventricular diastolic parameters are indeterminate in the setting of atrial fibrillation.  3. RV-RA gradient 25 mmHg. Right ventricular systolic function is moderately reduced. The right ventricular size is mildly enlarged.  4. Left atrial size was severely dilated.  5. The mitral valve is abnormal, mildly thickened. Mild mitral valve regurgitation.  6. The aortic valve is tricuspid. There is mild calcification of the aortic valve. Aortic valve regurgitation is not visualized.  7. Unable to estimate CVP. FINDINGS  Left Ventricle: Left ventricular ejection fraction, by estimation, is 45 to  50%. The left ventricle has mildly decreased function. The left ventricle demonstrates global hypokinesis. Definity contrast agent was given IV to delineate the left ventricular  endocardial borders. The left  ventricular internal cavity size was normal in size. There is mild left ventricular hypertrophy. Left ventricular diastolic parameters are indeterminate. Right Ventricle: RV-RA gradient 25 mmHg. The right ventricular size is mildly enlarged. No increase in right ventricular wall thickness. Right ventricular systolic function is moderately reduced. Left Atrium: Left atrial size was severely dilated. Right Atrium: Right atrial size was normal in size. Pericardium: There is no evidence of pericardial effusion. Mitral Valve: The mitral valve is abnormal. There is mild thickening of the mitral valve leaflet(s). Mild mitral valve regurgitation. Tricuspid Valve: The tricuspid valve is grossly normal. Tricuspid valve regurgitation is mild. Aortic Valve: The aortic valve is tricuspid. There is mild calcification of the aortic valve. There is mild aortic valve annular calcification. Aortic valve regurgitation is not visualized. Pulmonic Valve: The pulmonic valve was grossly normal. Pulmonic valve regurgitation is trivial. Aorta: The aortic root is normal in size and structure. Venous: Unable to estimate CVP. The inferior vena cava was not well visualized. LEFT VENTRICLE PLAX 2D LVIDd:         4.82 cm  Diastology LVIDs:         3.53 cm  LV e' medial:    6.57 cm/s LV PW:         1.58 cm  LV E/e' medial:  12.1 LV IVS:        1.20 cm  LV e' lateral:   8.60 cm/s LVOT diam:     2.00 cm  LV E/e' lateral: 9.3 LVOT Area:     3.14 cm  LEFT ATRIUM            Index LA diam:      3.00 cm  1.54 cm/m LA Vol (A4C): 102.0 ml 52.30 ml/m   AORTA Ao Root diam: 3.40 cm MITRAL VALVE               TRICUSPID VALVE MV Area (PHT): 3.21 cm    TR Peak grad:   25.0 mmHg MV Decel Time: 236 msec    TR Vmax:        250.00 cm/s MV E velocity: 79.70 cm/s                            SHUNTS                            Systemic Diam: 2.00 cm Rozann Lesches MD Electronically signed by Rozann Lesches MD Signature Date/Time: 09/24/2020/1:20:05 PM    Final     Scheduled Meds: . sodium chloride   Intravenous Once  . sodium chloride   Intravenous Once  . hydrocortisone  25 mg Rectal BID  . latanoprost  1 drop Both Eyes QHS  . levothyroxine  25 mcg Oral Q0600  . pantoprazole (PROTONIX) IV  40 mg Intravenous Q12H  . QUEtiapine  25 mg Oral QHS   Continuous Infusions: . sodium chloride 60 mL/hr at 09/25/20 0339    LOS: 1 day   Time spent: 35 minutes   Daneya Hartgrove Wynetta Emery, MD How to contact the [facility] Attending or Consulting provider [place] or covering provider during after hours [place], for this patient?  1. Check the care team in [facility] and look for a) attending/consulting TR[facility]  provider listed and b) the [facility] team listed 2. Log into [email or web address] and use [place]'s universal password to access. If you do not have the password, please contact the hospital operator. 3. Locate the [facility] provider you are looking for under [facility] and page to a number that you can be directly reached. 4. If you still have difficulty reaching the provider, please page the [facility] (Director on Call) for the Hospitalists listed on amion for assistance.  09/25/2020, 1:30 PM

## 2020-09-25 NOTE — Plan of Care (Signed)
  Problem: Acute Rehab PT Goals(only PT should resolve) Goal: Pt Will Go Supine/Side To Sit Outcome: Progressing Flowsheets (Taken 09/25/2020 1008) Pt will go Supine/Side to Sit: Independently Goal: Patient Will Transfer Sit To/From Stand Outcome: Progressing Flowsheets (Taken 09/25/2020 1008) Patient will transfer sit to/from stand: with min guard assist Goal: Pt Will Transfer Bed To Chair/Chair To Bed Outcome: Progressing Flowsheets (Taken 09/25/2020 1008) Pt will Transfer Bed to Chair/Chair to Bed: min guard assist Goal: Pt Will Ambulate Outcome: Progressing Flowsheets (Taken 09/25/2020 1008) Pt will Ambulate:  50 feet  with min guard assist  with least restrictive assistive device Goal: Pt/caregiver will Perform Home Exercise Program Outcome: Progressing Flowsheets (Taken 09/25/2020 1008) Pt/caregiver will Perform Home Exercise Program:  For increased strengthening  For improved balance  Independently   10:09 AM, 09/25/20 Mearl Latin PT, DPT Physical Therapist at [facility]

## 2020-09-25 NOTE — Progress Notes (Signed)
Spoke with Palliative Care NP Mariana Kaufman and Dr. Jenetta Downer who informed me the patient and family have elected to proceed with the EGD/dilation to help with dysphagia symptoms. [name] states the family has elected to then discharge home on hospice care.  Spoke with the patient who is in agreement today. He notes he would like an ice water when it's all done, "when I can." Per nursing, working on consent with patient's son.  Reviewed the chart. Hgb significantly improved to 8.8 after 2 u PRBC yesterday evening. Plt normal. Sodium and potassium normal. Echo completed and appears unchanged, cleared for procedure by Dr. Paticia Stack who recommended no further cardiac workup prior to EGD, avoid intra-procedural large volume shifts, close cardiac monitoring during the procedure.  Thank you for allowing [place] to participate in the care of David Bilis, DNP, AGNP-C Adult & Gerontological Nurse Practitioner [facility]

## 2020-09-25 NOTE — Progress Notes (Signed)
Manufacturing engineer [facility])  Received request from [facility] for hospice services at home after discharge.  Chart and pt information under review by [facility] physician.  Hospice eligibility pending at this time.  Hospital liaison spoke with patient's daughter, [name], to initiate education related to hospice philosophy and services and to answer any questions at this time.  [name] verbalized understanding of information given.    [name] shares that it is unclear at this time if pt will be returning to his son's home or if pt, along with his spouse, will move to daughter's home.  [name] asks that family be allowed to focus on upcoming EGD procedure before having to make this decision.  Megan made aware.  Of note, at this time GI has postponed EGD until tomorrow.  Megan aware.  Pease send signed and completed DNR home with pt/family.  Please provide prescriptions at discharge as needed to ensure ongoing symptom management until pt can be admitted onto hospice services.    DME needs discussed.  Family denies any DME needs at this time, reporting they want to see if he can stay in his own bed for now.  Address has been verified and is correct in the chart.  [facility] information and contact numbers given to daughter [name].  Above information shared with Chelsea Aus Manager.  Please call with any questions or concerns.  Thank you for the opportunity to participate in this pt's care.  Domenic Moras, BSN, RN [facility] [phone] (24h on call)

## 2020-09-26 ENCOUNTER — Inpatient Hospital Stay (HOSPITAL_COMMUNITY): Payer: Medicare HMO | Admitting: Anesthesiology

## 2020-09-26 ENCOUNTER — Encounter (HOSPITAL_COMMUNITY)
Admission: EM | Disposition: A | Discharge status: Hospice | Payer: Self-pay | Source: Home / Self Care | Attending: Family Medicine

## 2020-09-26 DIAGNOSIS — I5032 Chronic diastolic (congestive) heart failure: Secondary | ICD-10-CM | POA: Diagnosis not present

## 2020-09-26 DIAGNOSIS — K921 Melena: Secondary | ICD-10-CM

## 2020-09-26 DIAGNOSIS — D62 Acute posthemorrhagic anemia: Secondary | ICD-10-CM | POA: Diagnosis not present

## 2020-09-26 DIAGNOSIS — K269 Duodenal ulcer, unspecified as acute or chronic, without hemorrhage or perforation: Secondary | ICD-10-CM

## 2020-09-26 DIAGNOSIS — R531 Weakness: Secondary | ICD-10-CM | POA: Diagnosis not present

## 2020-09-26 DIAGNOSIS — K449 Diaphragmatic hernia without obstruction or gangrene: Secondary | ICD-10-CM

## 2020-09-26 DIAGNOSIS — R1310 Dysphagia, unspecified: Secondary | ICD-10-CM

## 2020-09-26 DIAGNOSIS — K222 Esophageal obstruction: Secondary | ICD-10-CM

## 2020-09-26 HISTORY — PX: ESOPHAGEAL DILATION: SHX303

## 2020-09-26 HISTORY — PX: ESOPHAGOGASTRODUODENOSCOPY (EGD) WITH PROPOFOL: SHX5813

## 2020-09-26 HISTORY — PX: BIOPSY: SHX5522

## 2020-09-26 LAB — CBC WITH DIFFERENTIAL/PLATELET
Abs Immature Granulocytes: 0.07 10*3/uL (ref 0.00–0.07)
Basophils Absolute: 0 10*3/uL (ref 0.0–0.1)
Basophils Relative: 0 %
Eosinophils Absolute: 0.1 10*3/uL (ref 0.0–0.5)
Eosinophils Relative: 1 %
HCT: 27.8 % — ABNORMAL LOW (ref 39.0–52.0)
Hemoglobin: 8.8 g/dL — ABNORMAL LOW (ref 13.0–17.0)
Immature Granulocytes: 1 %
Lymphocytes Relative: 4 %
Lymphs Abs: 0.3 10*3/uL — ABNORMAL LOW (ref 0.7–4.0)
MCH: 29.6 pg (ref 26.0–34.0)
MCHC: 31.7 g/dL (ref 30.0–36.0)
MCV: 93.6 fL (ref 80.0–100.0)
Monocytes Absolute: 1.6 10*3/uL — ABNORMAL HIGH (ref 0.1–1.0)
Monocytes Relative: 19 %
Neutro Abs: 6.4 10*3/uL (ref 1.7–7.7)
Neutrophils Relative %: 75 %
Platelets: 216 10*3/uL (ref 150–400)
RBC: 2.97 MIL/uL — ABNORMAL LOW (ref 4.22–5.81)
RDW: 16.1 % — ABNORMAL HIGH (ref 11.5–15.5)
WBC: 8.5 10*3/uL (ref 4.0–10.5)
nRBC: 0.2 % (ref 0.0–0.2)

## 2020-09-26 LAB — BASIC METABOLIC PANEL
Anion gap: 11 (ref 5–15)
BUN: 62 mg/dL — ABNORMAL HIGH (ref 8–23)
CO2: 31 mmol/L (ref 22–32)
Calcium: 8.4 mg/dL — ABNORMAL LOW (ref 8.9–10.3)
Chloride: 102 mmol/L (ref 98–111)
Creatinine, Ser: 1.22 mg/dL (ref 0.61–1.24)
GFR, Estimated: 56 mL/min — ABNORMAL LOW (ref >60)
Glucose, Bld: 96 mg/dL (ref 70–99)
Potassium: 3 mmol/L — ABNORMAL LOW (ref 3.5–5.1)
Sodium: 144 mmol/L (ref 135–145)

## 2020-09-26 SURGERY — ESOPHAGOGASTRODUODENOSCOPY (EGD) WITH PROPOFOL
Anesthesia: General

## 2020-09-26 MED ORDER — TORSEMIDE 20 MG PO TABS: 20.0000 mg | ORAL_TABLET | ORAL | 0 refills | Status: AC

## 2020-09-26 MED ORDER — LIDOCAINE HCL (PF) 2 % IJ SOLN
INTRAMUSCULAR | Status: AC
  Filled 2020-09-26: qty 5

## 2020-09-26 MED ORDER — ALPRAZOLAM 0.5 MG PO TABS: 0.5000 mg | ORAL_TABLET | Freq: Three times a day (TID) | ORAL | 0 refills | Status: AC | PRN

## 2020-09-26 MED ORDER — LIDOCAINE HCL (CARDIAC) PF 100 MG/5ML IV SOSY
PREFILLED_SYRINGE | INTRAVENOUS | Status: DC | PRN
  Administered 2020-09-26: 60 mg via INTRAVENOUS

## 2020-09-26 MED ORDER — LACTATED RINGERS IV SOLN: INTRAVENOUS | Status: DC | PRN

## 2020-09-26 MED ORDER — PANTOPRAZOLE SODIUM 40 MG PO TBEC: 40.0000 mg | DELAYED_RELEASE_TABLET | Freq: Two times a day (BID) | ORAL | 2 refills | Status: AC

## 2020-09-26 MED ORDER — DEXMEDETOMIDINE (PRECEDEX) IN NS 20 MCG/5ML (4 MCG/ML) IV SYRINGE
PREFILLED_SYRINGE | INTRAVENOUS | Status: DC | PRN
  Administered 2020-09-26: 8 ug via INTRAVENOUS

## 2020-09-26 MED ORDER — STERILE WATER FOR IRRIGATION IR SOLN: Status: DC | PRN

## 2020-09-26 MED ORDER — APIXABAN 2.5 MG PO TABS: 2.5000 mg | ORAL_TABLET | Freq: Two times a day (BID) | ORAL | 0 refills | Status: AC

## 2020-09-26 MED ORDER — PHENYLEPHRINE 40 MCG/ML (10ML) SYRINGE FOR IV PUSH (FOR BLOOD PRESSURE SUPPORT)
PREFILLED_SYRINGE | INTRAVENOUS | Status: DC | PRN
  Administered 2020-09-26: 80 ug via INTRAVENOUS
  Administered 2020-09-26 (×4): 160 ug via INTRAVENOUS
  Administered 2020-09-26: 80 ug via INTRAVENOUS

## 2020-09-26 MED ORDER — PROPOFOL 500 MG/50ML IV EMUL
INTRAVENOUS | Status: DC | PRN
  Administered 2020-09-26: 150 ug/kg/min via INTRAVENOUS

## 2020-09-26 MED ORDER — PROPOFOL 10 MG/ML IV BOLUS
INTRAVENOUS | Status: AC
  Filled 2020-09-26: qty 40

## 2020-09-26 MED ORDER — POTASSIUM CHLORIDE 10 MEQ/100ML IV SOLN
10.0000 meq | INTRAVENOUS | Status: AC
  Administered 2020-09-26: 10 meq via INTRAVENOUS
  Filled 2020-09-26 (×2): qty 100

## 2020-09-26 MED ORDER — DEXMEDETOMIDINE (PRECEDEX) IN NS 20 MCG/5ML (4 MCG/ML) IV SYRINGE
PREFILLED_SYRINGE | INTRAVENOUS | Status: AC
  Filled 2020-09-26: qty 5

## 2020-09-26 NOTE — Progress Notes (Signed)
Manufacturing engineer [facility])  Spoke with bedside RN and patients dtr [name], plan is to d/c home today.  Hospice will arrange with [name] at time to come to the home to enroll him in hospice.  [name] has no concerns about d/c plan at this time.  If any comfort meds are anticipated, please arrange for a 1-2 day supply as hospice will likely not get started until Sunday for Mr. Munguia.   Thank you, Venia Carbon RN, BSN, [facility] Liaison

## 2020-09-26 NOTE — Progress Notes (Signed)
Patient's daughter states understanding of discharge instuctions

## 2020-09-26 NOTE — Transfer of Care (Signed)
Immediate Anesthesia Transfer of Care Note  Patient: David Acosta  Procedure(s) Performed: ESOPHAGOGASTRODUODENOSCOPY (EGD) WITH PROPOFOL (N/A ) ESOPHAGEAL DILATION (N/A ) BIOPSY  Patient Location: PACU  Anesthesia Type:General  Level of Consciousness: awake, alert , oriented and sedated  Airway & Oxygen Therapy: Patient Spontanous Breathing and Patient connected to nasal cannula oxygen  Post-op Assessment: Post -op Vital signs reviewed and stable  Post vital signs: Reviewed and stable  Last Vitals:  Vitals Value Taken Time  BP 100/58 09/26/20 1000  Temp 36.2 C 09/26/20 0956  Pulse 91 09/26/20 0956  Resp 17 09/26/20 1005  SpO2 100 % 09/26/20 0956  Vitals shown include unvalidated device data.  Last Pain:  Vitals:   09/26/20 0956  TempSrc:   PainSc: 0-No pain         Complications: No complications documented.

## 2020-09-26 NOTE — Progress Notes (Signed)
We will proceed with EGD with possible esophageal dilation as scheduled.  I thoroughly discussed with the patient his procedure, including the risks involved. Patient understands what the procedure involves including the benefits and any risks. Patient understands alternatives to the proposed procedure. Risks including (but not limited to) bleeding, tearing of the lining (perforation), rupture of adjacent organs, problems with heart and lung function, infection, and medication reactions. A small percentage of complications may require surgery, hospitalization, repeat endoscopic procedure, and/or transfusion.  Patient understood and agreed.  David Peppers, MD Gastroenterology and Hepatology [facility]

## 2020-09-26 NOTE — Anesthesia Postprocedure Evaluation (Signed)
Anesthesia Post Note  Patient: David Acosta  Procedure(s) Performed: ESOPHAGOGASTRODUODENOSCOPY (EGD) WITH PROPOFOL (N/A ) ESOPHAGEAL DILATION (N/A ) BIOPSY  Patient location during evaluation: PACU Anesthesia Type: General Level of consciousness: awake and alert and oriented Pain management: satisfactory to patient Vital Signs Assessment: post-procedure vital signs reviewed and stable Respiratory status: spontaneous breathing, respiratory function stable and patient connected to nasal cannula oxygen Cardiovascular status: blood pressure returned to baseline and stable Postop Assessment: no apparent nausea or vomiting Anesthetic complications: no   No complications documented.   Last Vitals:  Vitals:   09/26/20 0956 09/26/20 1010  BP: (!) 100/58 104/63  Pulse: 91   Resp: 18   Temp: (!) 36.2 C   SpO2: 100%     Last Pain:  Vitals:   09/26/20 1010  TempSrc:   PainSc: 0-No pain                 Wetona Viramontes C Halla Chopp

## 2020-09-26 NOTE — Discharge Summary (Signed)
Physician Discharge Summary  David Acosta MWU:[id number] DOB: Dec 09, 1925 DOA: 09/23/2020  PCP: Christain Sacramento, MD  Admit date: 09/23/2020 Discharge date: 09/26/2020  Admitted From:  Home  Disposition: Home with Hospice   Recommendations for Outpatient Follow-up:  1. SYMPTOM MANAGEMENT PER HOSPICE PROTOCOL   Discharge Condition: STABLE   CODE STATUS: DNR   Diet: full liquid for today, then advance to soft diet as tolerated  Brief Hospitalization Summary: Please see all hospital notes, images, labs for full details of the hospitalization. ADMISSION HPI: David Acosta is a 87 y.o. male with medical history significant of unspecified skin cancer, dysphagia, glaucoma, hypertension, macular degeneration, chronic diastolic heart failure, PVCs, unspecified atrial fibrillation, stage IIIb CKD, recently discharged from [facility] (09/19/2020) due to acute combined diastolic and systolic HF who is coming to the emergency department due to weakness, decreased appetite and heartburn sensation.  He has also felt lightheaded.  The family also told the EMS crew that his mental status was altered, but the patient is oriented now in the ED.  He denies fever, chills, sore throat or rhinorrhea.  He denies productive cough, chest pain, palpitations, diaphoresis, PND orthopnea.  Denies nausea, emesis, diarrhea, constipation, dysuria, frequency or hematuria.  No polyuria, polydipsia, polyphagia or blurred vision.  ED Course: Initial vital signs were temperature 97.7 F, pulse 88, respirations 16, BP 107/83 mmHg O2 sat 100% on 2 LPM via [place].  Dr. Ashok Cordia described melanotic stools on exam.  No medications were given in the ED.  Labwork: CBC showed a white count of 9.3, hemoglobin 8.0 g/dL and platelets 212.  PT was 20.0 and INR 1.8.  Troponin was 117 and then 112 ng/L.  CMP shows a chloride 96 mmol/L, all other electrolytes are within normal range when calcium is corrected to albumin.  Glucose 113, BUN 57 and creatinine  1.61 mg/dL.  Total protein 5.9 and albumin 2.9 g/dL, the rest of the CMP results are within expected range.  Imaging: CT head without contrast shows cerebral atrophy without any acute intracranial abnormality.  Please see images and full radiology report for further detail.  HOSPITAL COURSE  1. Melena/acute blood loss anemia -He was treated with IV protonix, holding home apixaban, type and screened and transfused 2 units PRBC, appreciate GI evaluation. He had EGD done on 2/19 with findings of circumferential stenosis at the level of the GE junction that was dilated. 5 cm hiatal hernia. Severe duodenal mucosal edema in the bulb with presence of a large ulcer of 2 cm without presence of hematin or vessel. Biopsies were obtained.  GI reported that he can resume apixaban in 2 days.  Use protonix 40 mg BID.  Full liquid diet today and then advance to soft diet.     2. Essential hypertension - temporarily holding home torsemide, would resume after EGD.  3. Persistent atrial fibrillation - holding home apixaban in setting of anemia/GI bleeding.  4. Elevated troponin - attributed to demand ischemia in setting of anemia.  5. Chronic combined heart failure - stable for now, following.  Cardiology planning limited echocardiogram to be done as work up prior to EGD.   6. Stage 3a CKD - stable.  7. Glaucoma - resumed home therapy.  8. Hypoalbuminemia - dietitian consultation requested.   9. Goals of care: discussed with daughter and she says that he had outpatient palliative care consult planned for 2/18 and would like him seen in hospital if possible.  Unfortunately he has had a precipitous decline in the  last month and they worry about being able to care for him at home. Family decided to take patient home with hospice services and [facility] will be following patient at home.   10. Dementia - Resumed home seroquel QHS.  Haldol was ordered as needed for severe agitation but did not have to be used.   11. DNR - send patient home with signed [facility] DNR form.   DVT prophylaxis: SCDs Code Status: DNR  Family Communication: daughter [name] updated  Disposition: Home with Hospice.  Status is: Inpatient  Discharge Diagnoses:  Principal Problem:   Melena Active Problems:   HTN (hypertension)   Unspecified atrial fibrillation (HCC)   Chronic diastolic heart failure (HCC)   Hypoalbuminemia   Acute blood loss anemia   Glaucoma   Elevated troponin   Chronic renal disease, stage 3b (HCC)   Symptomatic anemia   Generalized weakness   Discharge Instructions:  Allergies as of 09/26/2020      Reactions   Capsaicin Other (See Comments)   "heart started to race and blood pressure went up"   Fluticasone Other (See Comments)   glaucoma   Procardia Xl [nifedipine] Other (See Comments)   Rapid heart beat   Timolol Swelling      Medication List    TAKE these medications   albuterol 108 (90 Base) MCG/ACT inhaler Commonly known as: VENTOLIN HFA Inhale 2 puffs into the lungs every 4 (four) hours as needed for wheezing.   ALPRAZolam 0.5 MG tablet Commonly known as: XANAX Take 1 tablet (0.5 mg total) by mouth 3 (three) times daily as needed for anxiety. What changed: when to take this   apixaban 2.5 MG Tabs tablet Commonly known as: Eliquis Take 1 tablet (2.5 mg total) by mouth 2 (two) times daily. Start taking on: September 29, 2020 What changed: These instructions start on September 29, 2020. If you are unsure what to do until then, ask your doctor or other care provider.   ferrous sulfate 325 (65 FE) MG tablet Take 325 mg by mouth daily with breakfast.   hydrocortisone 25 MG suppository Commonly known as: ANUSOL-HC Place 25 mg rectally 2 (two) times daily.   latanoprost 0.005 % ophthalmic solution Commonly known as: XALATAN Place 1 drop into both eyes at bedtime.   levothyroxine 25 MCG tablet Commonly known as: SYNTHROID Take 25 mcg by mouth daily.   pantoprazole 40  MG tablet Commonly known as: PROTONIX Take 1 tablet (40 mg total) by mouth 2 (two) times daily.   polyethylene glycol 17 g packet Commonly known as: MIRALAX / GLYCOLAX Take 17 g by mouth daily.   PreserVision AREDS 2 Chew Chew 1 each by mouth daily.   QUEtiapine 25 MG tablet Commonly known as: SEROQUEL Take 25 mg by mouth at bedtime.   torsemide 20 MG tablet Commonly known as: DEMADEX Take 1 tablet (20 mg total) by mouth every other day. Start taking on: September 28, 2020 What changed:   how much to take  when to take this       Allergies  Allergen Reactions  . Capsaicin Other (See Comments)    "heart started to race and blood pressure went up"  . Fluticasone Other (See Comments)    glaucoma  . Procardia Xl [Nifedipine] Other (See Comments)    Rapid heart beat  . Timolol Swelling   Allergies as of 09/26/2020      Reactions   Capsaicin Other (See Comments)   "heart started to race and  blood pressure went up"   Fluticasone Other (See Comments)   glaucoma   Procardia Xl [nifedipine] Other (See Comments)   Rapid heart beat   Timolol Swelling      Medication List    TAKE these medications   albuterol 108 (90 Base) MCG/ACT inhaler Commonly known as: VENTOLIN HFA Inhale 2 puffs into the lungs every 4 (four) hours as needed for wheezing.   ALPRAZolam 0.5 MG tablet Commonly known as: XANAX Take 1 tablet (0.5 mg total) by mouth 3 (three) times daily as needed for anxiety. What changed: when to take this   apixaban 2.5 MG Tabs tablet Commonly known as: Eliquis Take 1 tablet (2.5 mg total) by mouth 2 (two) times daily. Start taking on: September 29, 2020 What changed: These instructions start on September 29, 2020. If you are unsure what to do until then, ask your doctor or other care provider.   ferrous sulfate 325 (65 FE) MG tablet Take 325 mg by mouth daily with breakfast.   hydrocortisone 25 MG suppository Commonly known as: ANUSOL-HC Place 25 mg rectally  2 (two) times daily.   latanoprost 0.005 % ophthalmic solution Commonly known as: XALATAN Place 1 drop into both eyes at bedtime.   levothyroxine 25 MCG tablet Commonly known as: SYNTHROID Take 25 mcg by mouth daily.   pantoprazole 40 MG tablet Commonly known as: PROTONIX Take 1 tablet (40 mg total) by mouth 2 (two) times daily.   polyethylene glycol 17 g packet Commonly known as: MIRALAX / GLYCOLAX Take 17 g by mouth daily.   PreserVision AREDS 2 Chew Chew 1 each by mouth daily.   QUEtiapine 25 MG tablet Commonly known as: SEROQUEL Take 25 mg by mouth at bedtime.   torsemide 20 MG tablet Commonly known as: DEMADEX Take 1 tablet (20 mg total) by mouth every other day. Start taking on: September 28, 2020 What changed:   how much to take  when to take this       Procedures/Studies: CT HEAD WO CONTRAST  Result Date: 09/23/2020 CLINICAL DATA:  Weakness, altered level of consciousness EXAM: CT HEAD WITHOUT CONTRAST TECHNIQUE: Contiguous axial images were obtained from the base of the skull through the vertex without intravenous contrast. COMPARISON:  12/22/2009 FINDINGS: Brain: No acute infarct or hemorrhage. There is mild diffuse cerebral atrophy, age-appropriate. Lateral ventricles and midline structures are unremarkable. No acute extra-axial fluid collections. No mass effect. Vascular: No hyperdense vessel or unexpected calcification. Skull: Normal. Negative for fracture or focal lesion. Sinuses/Orbits: Postsurgical changes from right mastoidectomy. Chronic bilateral mastoid effusions. Paranasal sinuses are clear. Other: None. IMPRESSION: 1. Age-appropriate cerebral atrophy.  No acute intracranial process. Electronically Signed   By: Randa Ngo M.D.   On: 09/23/2020 21:19   DG Chest Port 1 View  Result Date: 09/17/2020 CLINICAL DATA:  87 year old male with bradycardia, shortness of breath status post COVID-19 three weeks ago. Atrial fibrillation. EXAM: PORTABLE CHEST 1  VIEW COMPARISON:  Portable chest 09/16/2020 and earlier. FINDINGS: Portable AP semi upright view at 0703 hours. Improved lung volumes. Mild cardiomegaly is stable. Other mediastinal contours are within normal limits. Visualized tracheal air column is within normal limits. Veiling bilateral lower lung opacity, dense retrocardiac opacity. But bilateral pulmonary vascularity appears decreased, more normal. No pneumothorax. No air bronchograms. No acute osseous abnormality identified. IMPRESSION: 1. Small to moderate bilateral pleural effusions with lower lobe collapse or consolidation. 2. Normalized pulmonary vascularity since yesterday. Electronically Signed   By: Herminio Heads.D.  On: 09/17/2020 07:12   DG Chest Portable 1 View  Result Date: 09/16/2020 CLINICAL DATA:  Short of breath, bradycardia EXAM: PORTABLE CHEST 1 VIEW COMPARISON:  03/24/2019 FINDINGS: Stable large cardiac silhouette. Bilateral small pleural effusions. Mild central venous congestion. No focal infiltrate. No overt pulmonary edema. No pneumothorax. IMPRESSION: Small bilateral pleural effusions and central venous congestion. Electronically Signed   By: Suzy Bouchard M.D.   On: 09/16/2020 12:24   VAS [place] LOWER EXTREMITY VENOUS (DVT)  Result Date: 09/17/2020  Lower Venous DVT Study Indications: Edema.  Comparison Study: No previous exams. Performing Technologist: Rogelia Rohrer  Examination Guidelines: A complete evaluation includes B-mode imaging, spectral Doppler, color Doppler, and power Doppler as needed of all accessible portions of each vessel. Bilateral testing is considered an integral part of a complete examination. Limited examinations for reoccurring indications may be performed as noted. The reflux portion of the exam is performed with the patient in reverse Trendelenburg.  +---------+---------------+---------+-----------+----------+--------------+ RIGHT    CompressibilityPhasicitySpontaneityPropertiesThrombus Aging  +---------+---------------+---------+-----------+----------+--------------+ CFV      Full           Yes      Yes                                 +---------+---------------+---------+-----------+----------+--------------+ SFJ      Full                                                        +---------+---------------+---------+-----------+----------+--------------+ FV Prox  Full           Yes      Yes                                 +---------+---------------+---------+-----------+----------+--------------+ FV Mid   Full           Yes      Yes                                 +---------+---------------+---------+-----------+----------+--------------+ FV DistalFull           Yes      Yes                                 +---------+---------------+---------+-----------+----------+--------------+ PFV      Full                                                        +---------+---------------+---------+-----------+----------+--------------+ POP      Full           Yes      Yes                                 +---------+---------------+---------+-----------+----------+--------------+ PTV      Full                                                        +---------+---------------+---------+-----------+----------+--------------+  PERO     Full                                                        +---------+---------------+---------+-----------+----------+--------------+   +---------+---------------+---------+-----------+----------+--------------+ LEFT     CompressibilityPhasicitySpontaneityPropertiesThrombus Aging +---------+---------------+---------+-----------+----------+--------------+ CFV      Full           Yes      Yes                                 +---------+---------------+---------+-----------+----------+--------------+ SFJ      Full                                                         +---------+---------------+---------+-----------+----------+--------------+ FV Prox  Full           Yes      Yes                                 +---------+---------------+---------+-----------+----------+--------------+ FV Mid   Full           Yes      Yes                                 +---------+---------------+---------+-----------+----------+--------------+ FV DistalFull           Yes      Yes                                 +---------+---------------+---------+-----------+----------+--------------+ PFV      Full                                                        +---------+---------------+---------+-----------+----------+--------------+ POP      Full           Yes      Yes                                 +---------+---------------+---------+-----------+----------+--------------+ PTV      Full                                                        +---------+---------------+---------+-----------+----------+--------------+ PERO     Full                                                        +---------+---------------+---------+-----------+----------+--------------+  Summary: BILATERAL: - No evidence of deep vein thrombosis seen in the lower extremities, bilaterally. - No evidence of superficial venous thrombosis in the lower extremities, bilaterally. -No evidence of popliteal cyst, bilaterally.   *See table(s) above for measurements and observations. Electronically signed by Servando Snare MD on 09/17/2020 at 1:04:38 PM.    Final    ECHOCARDIOGRAM LIMITED  Result Date: 09/24/2020    ECHOCARDIOGRAM LIMITED REPORT   Patient Name:   David Acosta Date of Exam: 09/24/2020 Medical Rec #:  [id number]       Height:       69.0 in Accession #:    [id number]      Weight:       174.6 lb Date of Birth:  11-Mar-1926      BSA:          1.950 m Patient Age:    32 years        BP:           102/56 mmHg Patient Gender: M               HR:           95 bpm. Exam Location:   [facility] Procedure: Limited Echo and Intracardiac Opacification Agent Indications:    Cardiomyopathy-Unspecified I42.9  History:        Patient has prior history of Echocardiogram examinations, most                 recent 08/04/2020. CHF, Arrythmias:Atrial Fibrillation,                 Signs/Symptoms:Chest Pain; Risk Factors:Hypertension and Former                 Smoker. Elevated Troponin, Chronic Renal Disease.  Sonographer:    [name] RDCS (AE) Referring Phys: [id number] [place]  1. Limited study with Definity contrast.  2. Left ventricular ejection fraction, by estimation, is 45 to 50%. The left ventricle has mildly decreased function. The left ventricle demonstrates global hypokinesis. There is mild left ventricular hypertrophy. Left ventricular diastolic parameters are indeterminate in the setting of atrial fibrillation.  3. RV-RA gradient 25 mmHg. Right ventricular systolic function is moderately reduced. The right ventricular size is mildly enlarged.  4. Left atrial size was severely dilated.  5. The mitral valve is abnormal, mildly thickened. Mild mitral valve regurgitation.  6. The aortic valve is tricuspid. There is mild calcification of the aortic valve. Aortic valve regurgitation is not visualized.  7. Unable to estimate CVP. FINDINGS  Left Ventricle: Left ventricular ejection fraction, by estimation, is 45 to 50%. The left ventricle has mildly decreased function. The left ventricle demonstrates global hypokinesis. Definity contrast agent was given IV to delineate the left ventricular  endocardial borders. The left ventricular internal cavity size was normal in size. There is mild left ventricular hypertrophy. Left ventricular diastolic parameters are indeterminate. Right Ventricle: RV-RA gradient 25 mmHg. The right ventricular size is mildly enlarged. No increase in right ventricular wall thickness. Right ventricular systolic function is moderately reduced. Left Atrium:  Left atrial size was severely dilated. Right Atrium: Right atrial size was normal in size. Pericardium: There is no evidence of pericardial effusion. Mitral Valve: The mitral valve is abnormal. There is mild thickening of the mitral valve leaflet(s). Mild mitral valve regurgitation. Tricuspid Valve: The tricuspid valve is grossly normal. Tricuspid valve regurgitation is mild. Aortic Valve: The aortic valve is tricuspid. There is mild calcification of  the aortic valve. There is mild aortic valve annular calcification. Aortic valve regurgitation is not visualized. Pulmonic Valve: The pulmonic valve was grossly normal. Pulmonic valve regurgitation is trivial. Aorta: The aortic root is normal in size and structure. Venous: Unable to estimate CVP. The inferior vena cava was not well visualized. LEFT VENTRICLE PLAX 2D LVIDd:         4.82 cm  Diastology LVIDs:         3.53 cm  LV e' medial:    6.57 cm/s LV PW:         1.58 cm  LV E/e' medial:  12.1 LV IVS:        1.20 cm  LV e' lateral:   8.60 cm/s LVOT diam:     2.00 cm  LV E/e' lateral: 9.3 LVOT Area:     3.14 cm  LEFT ATRIUM            Index LA diam:      3.00 cm  1.54 cm/m LA Vol (A4C): 102.0 ml 52.30 ml/m   AORTA Ao Root diam: 3.40 cm MITRAL VALVE               TRICUSPID VALVE MV Area (PHT): 3.21 cm    TR Peak grad:   25.0 mmHg MV Decel Time: 236 msec    TR Vmax:        250.00 cm/s MV E velocity: 79.70 cm/s                            SHUNTS                            Systemic Diam: 2.00 cm Rozann Lesches MD Electronically signed by Rozann Lesches MD Signature Date/Time: 09/24/2020/1:20:05 PM    Final       Subjective: Pt tolerated EGD well.  He is ready to go home.  Tolerating full liquid diet.   Discharge Exam: Vitals:   09/26/20 0956 09/26/20 1010  BP: (!) 100/58 104/63  Pulse: 91   Resp: 18   Temp: (!) 97.1 F (36.2 C)   SpO2: 100%    Vitals:   09/26/20 0900 09/26/20 0916 09/26/20 0956 09/26/20 1010  BP: (!) 87/61 125/62 (!) 100/58 104/63   Pulse: 88  91   Resp: 16  18   Temp: (!) 97.1 F (36.2 C)  (!) 97.1 F (36.2 C)   TempSrc: Axillary     SpO2: 99%  100%   Weight:      Height:       General: Pt is alert, awake, he has underlying dementia, not in acute distress Cardiovascular: normal S1/S2 +, no rubs, no gallops Respiratory: CTA bilaterally, no wheezing, no rhonchi Abdominal: Soft, NT, ND, bowel sounds + Extremities: no edema, no cyanosis   The results of significant diagnostics from this hospitalization (including imaging, microbiology, ancillary and laboratory) are listed below for reference.     Microbiology: Recent Results (from the past 240 hour(s))  Blood Culture (routine x 2)     Status: None   Collection Time: 09/16/20 12:38 PM   Specimen: BLOOD  Result Value Ref Range Status   Specimen Description BLOOD RIGHT ANTECUBITAL  Final   Special Requests   Final    BOTTLES DRAWN AEROBIC AND ANAEROBIC Blood Culture results may not be optimal due to an excessive volume of blood received in culture bottles  Culture   Final    NO GROWTH 5 DAYS Performed at [facility], [place] [address], [place], [place] [address]    Report Status 09/21/2020 FINAL  Final  Blood Culture (routine x 2)     Status: None   Collection Time: 09/16/20 12:44 PM   Specimen: BLOOD RIGHT WRIST  Result Value Ref Range Status   Specimen Description BLOOD RIGHT WRIST  Final   Special Requests   Final    BOTTLES DRAWN AEROBIC AND ANAEROBIC Blood Culture adequate volume   Culture   Final    NO GROWTH 5 DAYS Performed at [facility], [place] [address], [place], [place] [address]    Report Status 09/21/2020 FINAL  Final  Resp Panel by RT-PCR (Flu A&B, Covid) Nasopharyngeal Swab     Status: None   Collection Time: 09/16/20 12:51 PM   Specimen: Nasopharyngeal Swab; Nasopharyngeal(NP) swabs in vial transport medium  Result Value Ref Range Status   SARS Coronavirus 2 by RT PCR NEGATIVE NEGATIVE Final    Comment: (NOTE) SARS-CoV-2  target nucleic acids are NOT DETECTED.  The SARS-CoV-2 RNA is generally detectable in upper respiratory specimens during the acute phase of infection. The lowest concentration of SARS-CoV-2 viral copies this assay can detect is 138 copies/mL. A negative result does not preclude SARS-Cov-2 infection and should not be used as the sole basis for treatment or other patient management decisions. A negative result may occur with  improper specimen collection/handling, submission of specimen other than nasopharyngeal swab, presence of viral mutation(s) within the areas targeted by this assay, and inadequate number of viral copies(<138 copies/mL). A negative result must be combined with clinical observations, patient history, and epidemiological information. The expected result is Negative.  Fact Sheet for Patients:  EntrepreneurPulse.com.au  Fact Sheet for Healthcare Providers:  IncredibleEmployment.be  This test is no t yet approved or cleared by the [place] FDA and  has been authorized for detection and/or diagnosis of SARS-CoV-2 by FDA under an Emergency Use Authorization (EUA). This EUA will remain  in effect (meaning this test can be used) for the duration of the COVID-19 declaration under Section 564(b)(1) of the Act, 21 U.S.C.section 360bbb-3(b)(1), unless the authorization is terminated  or revoked sooner.       Influenza A by PCR NEGATIVE NEGATIVE Final   Influenza B by PCR NEGATIVE NEGATIVE Final    Comment: (NOTE) The Xpert Xpress SARS-CoV-2/FLU/RSV plus assay is intended as an aid in the diagnosis of influenza from Nasopharyngeal swab specimens and should not be used as a sole basis for treatment. Nasal washings and aspirates are unacceptable for Xpert Xpress SARS-CoV-2/FLU/RSV testing.  Fact Sheet for Patients: EntrepreneurPulse.com.au  Fact Sheet for Healthcare  Providers: IncredibleEmployment.be  This test is not yet approved or cleared by the [place] FDA and has been authorized for detection and/or diagnosis of SARS-CoV-2 by FDA under an Emergency Use Authorization (EUA). This EUA will remain in effect (meaning this test can be used) for the duration of the COVID-19 declaration under Section 564(b)(1) of the Act, 21 U.S.C. section 360bbb-3(b)(1), unless the authorization is terminated or revoked.  Performed at [facility], [place] [address] [place], [place] [address]   SARS CORONAVIRUS 2 (TAT 6-24 HRS) Nasopharyngeal Nasopharyngeal Swab     Status: None   Collection Time: 09/23/20 11:45 PM   Specimen: Nasopharyngeal Swab  Result Value Ref Range Status   SARS Coronavirus 2 NEGATIVE NEGATIVE Final    Comment: (NOTE)  SARS-CoV-2 target nucleic acids are NOT DETECTED.  The SARS-CoV-2 RNA is generally detectable in upper and lower respiratory specimens during the acute phase of infection. Negative results do not preclude SARS-CoV-2 infection, do not rule out co-infections with other pathogens, and should not be used as the sole basis for treatment or other patient management decisions. Negative results must be combined with clinical observations, patient history, and epidemiological information. The expected result is Negative.  Fact Sheet for Patients: SugarRoll.be  Fact Sheet for Healthcare Providers: https://www.woods-mathews.com/  This test is not yet approved or cleared by the [place] FDA and  has been authorized for detection and/or diagnosis of SARS-CoV-2 by FDA under an Emergency Use Authorization (EUA). This EUA will remain  in effect (meaning this test can be used) for the duration of the COVID-19 declaration under Se ction 564(b)(1) of the Act, 21 U.S.C. section 360bbb-3(b)(1), unless the authorization is terminated or revoked sooner.  Performed at  [facility], [place] [address], [place], [place] [address]      Labs: BNP (last 3 results) Recent Labs    09/16/20 1308  BNP 0,258.5*   Basic Metabolic Panel: Recent Labs  Lab 09/23/20 2142 09/24/20 0452 09/25/20 0505 09/26/20 0552  NA 136 140 142 144  K 3.9 3.9 3.5 3.0*  CL 96* 95* 99 102  CO2 31 34* 32 31  GLUCOSE 110* 99 110* 96  BUN 57* 61* 73* 62*  CREATININE 1.61* 1.52* 1.45* 1.22  CALCIUM 8.5* 8.8* 8.4* 8.4*   Liver Function Tests: Recent Labs  Lab 09/23/20 2142 09/24/20 0452  AST 24 23  ALT 29 27  ALKPHOS 55 51  BILITOT 0.8 0.9  PROT 5.9* 5.5*  ALBUMIN 2.9* 2.8*   No results for input(s): LIPASE, AMYLASE in the last 168 hours. No results for input(s): AMMONIA in the last 168 hours. CBC: Recent Labs  Lab 09/23/20 2142 09/24/20 0452 09/24/20 1543 09/25/20 0505 09/26/20 0552  WBC 9.3 8.1  --  8.2 8.5  NEUTROABS  --   --   --   --  6.4  HGB 8.0* 7.4* 6.4* 8.8* 8.8*  HCT 25.4* 23.8* 20.9* 28.3* 27.8*  MCV 92.7 93.0  --  92.5 93.6  PLT 212 218  --  198 216   Cardiac Enzymes: No results for input(s): CKTOTAL, CKMB, CKMBINDEX, TROPONINI in the last 168 hours. BNP: Invalid input(s): POCBNP CBG: Recent Labs  Lab 09/19/20 1217  GLUCAP 95   D-Dimer No results for input(s): DDIMER in the last 72 hours. Hgb A1c No results for input(s): HGBA1C in the last 72 hours. Lipid Profile No results for input(s): CHOL, HDL, LDLCALC, TRIG, CHOLHDL, LDLDIRECT in the last 72 hours. Thyroid function studies No results for input(s): TSH, T4TOTAL, T3FREE, THYROIDAB in the last 72 hours.  Invalid input(s): FREET3 Anemia work up No results for input(s): VITAMINB12, FOLATE, FERRITIN, TIBC, IRON, RETICCTPCT in the last 72 hours. Urinalysis No results found for: COLORURINE, APPEARANCEUR, [place], [place], [place], [place], [place], [place], PROTEINUR, UROBILINOGEN, NITRITE, LEUKOCYTESUR Sepsis Labs Invalid input(s): PROCALCITONIN,  WBC,   LACTICIDVEN Microbiology Recent Results (from the past 240 hour(s))  Blood Culture (routine x 2)     Status: None   Collection Time: 09/16/20 12:38 PM   Specimen: BLOOD  Result Value Ref Range Status   Specimen Description BLOOD RIGHT ANTECUBITAL  Final   Special Requests   Final    BOTTLES DRAWN AEROBIC AND ANAEROBIC Blood Culture results may not be optimal due to an  excessive volume of blood received in culture bottles   Culture   Final    NO GROWTH 5 DAYS Performed at [facility], [place] [address], [place], [place] [address]    Report Status 09/21/2020 FINAL  Final  Blood Culture (routine x 2)     Status: None   Collection Time: 09/16/20 12:44 PM   Specimen: BLOOD RIGHT WRIST  Result Value Ref Range Status   Specimen Description BLOOD RIGHT WRIST  Final   Special Requests   Final    BOTTLES DRAWN AEROBIC AND ANAEROBIC Blood Culture adequate volume   Culture   Final    NO GROWTH 5 DAYS Performed at [facility], [place] [address], [place], [place] [address]    Report Status 09/21/2020 FINAL  Final  Resp Panel by RT-PCR (Flu A&B, Covid) Nasopharyngeal Swab     Status: None   Collection Time: 09/16/20 12:51 PM   Specimen: Nasopharyngeal Swab; Nasopharyngeal(NP) swabs in vial transport medium  Result Value Ref Range Status   SARS Coronavirus 2 by RT PCR NEGATIVE NEGATIVE Final    Comment: (NOTE) SARS-CoV-2 target nucleic acids are NOT DETECTED.  The SARS-CoV-2 RNA is generally detectable in upper respiratory specimens during the acute phase of infection. The lowest concentration of SARS-CoV-2 viral copies this assay can detect is 138 copies/mL. A negative result does not preclude SARS-Cov-2 infection and should not be used as the sole basis for treatment or other patient management decisions. A negative result may occur with  improper specimen collection/handling, submission of specimen other than nasopharyngeal swab, presence of viral mutation(s) within the areas  targeted by this assay, and inadequate number of viral copies(<138 copies/mL). A negative result must be combined with clinical observations, patient history, and epidemiological information. The expected result is Negative.  Fact Sheet for Patients:  EntrepreneurPulse.com.au  Fact Sheet for Healthcare Providers:  IncredibleEmployment.be  This test is no t yet approved or cleared by the [place] FDA and  has been authorized for detection and/or diagnosis of SARS-CoV-2 by FDA under an Emergency Use Authorization (EUA). This EUA will remain  in effect (meaning this test can be used) for the duration of the COVID-19 declaration under Section 564(b)(1) of the Act, 21 U.S.C.section 360bbb-3(b)(1), unless the authorization is terminated  or revoked sooner.       Influenza A by PCR NEGATIVE NEGATIVE Final   Influenza B by PCR NEGATIVE NEGATIVE Final    Comment: (NOTE) The Xpert Xpress SARS-CoV-2/FLU/RSV plus assay is intended as an aid in the diagnosis of influenza from Nasopharyngeal swab specimens and should not be used as a sole basis for treatment. Nasal washings and aspirates are unacceptable for Xpert Xpress SARS-CoV-2/FLU/RSV testing.  Fact Sheet for Patients: EntrepreneurPulse.com.au  Fact Sheet for Healthcare Providers: IncredibleEmployment.be  This test is not yet approved or cleared by the [place] FDA and has been authorized for detection and/or diagnosis of SARS-CoV-2 by FDA under an Emergency Use Authorization (EUA). This EUA will remain in effect (meaning this test can be used) for the duration of the COVID-19 declaration under Section 564(b)(1) of the Act, 21 U.S.C. section 360bbb-3(b)(1), unless the authorization is terminated or revoked.  Performed at [facility], [place] [address], [place], [place] [address]   SARS CORONAVIRUS 2 (TAT 6-24 HRS) Nasopharyngeal Nasopharyngeal  Swab     Status: None   Collection Time: 09/23/20 11:45 PM   Specimen: Nasopharyngeal Swab  Result Value Ref Range Status   SARS  Coronavirus 2 NEGATIVE NEGATIVE Final    Comment: (NOTE) SARS-CoV-2 target nucleic acids are NOT DETECTED.  The SARS-CoV-2 RNA is generally detectable in upper and lower respiratory specimens during the acute phase of infection. Negative results do not preclude SARS-CoV-2 infection, do not rule out co-infections with other pathogens, and should not be used as the sole basis for treatment or other patient management decisions. Negative results must be combined with clinical observations, patient history, and epidemiological information. The expected result is Negative.  Fact Sheet for Patients: SugarRoll.be  Fact Sheet for Healthcare Providers: https://www.woods-mathews.com/  This test is not yet approved or cleared by the [place] FDA and  has been authorized for detection and/or diagnosis of SARS-CoV-2 by FDA under an Emergency Use Authorization (EUA). This EUA will remain  in effect (meaning this test can be used) for the duration of the COVID-19 declaration under Se ction 564(b)(1) of the Act, 21 U.S.C. section 360bbb-3(b)(1), unless the authorization is terminated or revoked sooner.  Performed at [facility], [place] [address], [place], [place] [address]     Time coordinating discharge: 37 mins   SIGNED:  Irwin Brakeman, MD  [facility] 09/26/2020, 11:26 AM How to contact the [facility] Attending or Consulting provider [place] or covering provider during after hours [place], for this patient?  1. Check the care team in [facility] and look for a) attending/consulting TRH provider listed and b) the [facility] team listed 2. Log into [email or web address] and use [place]'s universal password to access. If you do not have the password, please contact the hospital operator. 3. Locate the [facility] provider you are  looking for under [facility] and page to a number that you can be directly reached. 4. If you still have difficulty reaching the provider, please page the [facility] (Director on Call) for the Hospitalists listed on amion for assistance.

## 2020-09-26 NOTE — Op Note (Signed)
[facility] Patient Name: David Acosta Procedure Date: 09/26/2020 8:25 AM MRN: [id number] Date of Birth: [birth year] Attending MD: [name] ,  CSN: [id number] Age: 87 Admit Type: Outpatient Procedure:                Upper GI endoscopy Indications:              Dysphagia, Melena Providers:                [name], Lurline Del, RN, Casimer Bilis, Technician Referring MD:              Medicines:                Monitored Anesthesia Care Complications:            No immediate complications. Estimated Blood Loss:     Estimated blood loss: none. Procedure:                Pre-Anesthesia Assessment:                           - Prior to the procedure, a History and Physical                            was performed, and patient medications, allergies                            and sensitivities were reviewed. The patient's                            tolerance of previous anesthesia was reviewed.                           - The risks and benefits of the procedure and the                            sedation options and risks were discussed with the                            patient. All questions were answered and informed                            consent was obtained.                           - ASA Grade Assessment: IV - A patient with severe                            systemic disease that is a constant threat to life.                           After obtaining informed consent, the endoscope was                            passed under direct  vision. Throughout the                            procedure, the patient's blood pressure, pulse, and                            oxygen saturations were monitored continuously. The                            [id number]-[id number] ([id number]) scope was introduced through the                            mouth, and advanced to the second part of duodenum.                            The upper GI endoscopy was  accomplished without                            difficulty. The patient tolerated the procedure                            well. Scope In: 9:22:16 AM Scope Out: 9:37:20 AM Total Procedure Duration: 0 hours 15 minutes 4 seconds  Findings:      One benign-appearing, intrinsic moderate (circumferential scarring or       stenosis; an endoscope may pass) stenosis was found 39 to 40 cm from the       incisors. This stenosis measured 1 cm (inner diameter) x 1 cm (in       length). The stenosis was traversed. A TTS dilator was passed through       the scope. Dilation with a 12-13.5-15 mm balloon dilator was performed       to 20 mm. Hematin was noted after dilation.      A 5 cm hiatal hernia was found. The proximal extent of the gastric folds       (end of tubular esophagus) was 45 cm from the incisors. The Z-line was       40 cm from the incisors.      The entire examined stomach was normal. Biopsies were taken with a cold       forceps for Helicobacter pylori testing.      One non-bleeding cratered duodenal ulcer with a clean ulcer base       (Forrest Class III) was found in the duodenal bulb. This ulcer had some       yellowish coating - ischemic?Marland Kitchen The lesion was 15 mm in largest       dimension. There was significant edema around the bul. Biopsies form the       edematous area were taken with a cold forceps for histology. Impression:               - Benign-appearing esophageal stenosis. Dilated.                           - 5 cm hiatal hernia.                           - Normal stomach. Biopsied.                           -  Non-bleeding duodenal ulcer with a clean ulcer                            base (Forrest Class III). Biopsied. Moderate Sedation:      Per Anesthesia Care Recommendation:           - Discharge patient to home (ambulatory).                           - Full liquid diet today, then advance to soft diet                            and continue advancing as tolerated.                            - Await pathology results.                           - Use Protonix (pantoprazole) 40 mg PO BID                            indefinitely.                           - Resume Eliquis at prior dose in 2 days. Procedure Code(s):        --- Professional ---                           [phone], Esophagogastroduodenoscopy, flexible,                            transoral; with transendoscopic balloon dilation of                            esophagus (less than 30 mm diameter)                           43239, 59, Esophagogastroduodenoscopy, flexible,                            transoral; with biopsy, single or multiple Diagnosis Code(s):        --- Professional ---                           K22.2, Esophageal obstruction                           K44.9, Diaphragmatic hernia without obstruction or                            gangrene                           K26.9, Duodenal ulcer, unspecified as acute or                            chronic, without hemorrhage or perforation  R13.10, Dysphagia, unspecified                           K92.1, Melena (includes Hematochezia) CPT copyright 2019 American Medical Association. All rights reserved. The codes documented in this report are preliminary and upon coder review may  be revised to meet current compliance requirements. [name], MD [name],  09/26/2020 9:51:53 AM This report has been signed electronically. Number of Addenda: 0

## 2020-09-26 NOTE — Brief Op Note (Signed)
09/23/2020 - 09/26/2020  9:52 AM  PATIENT:  David Acosta  87 y.o. 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  Patient was found to have circumferential stenosis at the level of the GE junction approximately 10 mm in diameter.  This was dilated with a through-the-scope balloon with a maximal dilation size of 15 mm.  Mucosal disruption was noted upon resection of the area.  He was also found to have a 5 cm hiatal hernia.  Stomach was normal, this was biopsied for H. pylori.  Duodenum showed presence of severe mucosal edema in the bulb with presence of a large ulcer of 2 cm without presence of hematin or visible vessel/clot (Forrest III).  Biopsies were obtained from the mucosal edema area.  RECOMMENDATIONS: - Discharge patient to home (ambulatory).  - Full liquid diet today, then advance to soft diet and continue advancing as tolerated.  - Await pathology results.  - Use Protonix (pantoprazole) 40 mg PO BID indefinitely.  - Resume Eliquis at prior dose in 2 days.  - GI service will sign-off, please call us back if you have any more questions.  [name], MD Gastroenterology and Hepatology [facility]

## 2020-09-26 NOTE — TOC Transition Note (Signed)
Transition of Care [facility]) - [name]   Patient Details  Name: David Acosta MRN: [id number] Date of Birth: 05/17/1926  [facility]) [name]:  Natasha Bence, LCSW Phone Number: 09/26/2020, 1:25 PM   Clinical Narrative:    CSW notified of patient's readiness for discharge. CSW confirmed with RN that patient's family had already spoken with Hospice RN, Moshe Salisbury for d/c. Patient's son reported that patient will be returning to the [employer] address on file instead of his daughters home. CSW completed med necessity and called EMS. TOC signing off.    Final next level of care: Home w Hospice Care Barriers to Discharge: Barriers Resolved   Patient Goals and CMS Choice Patient states their goals for this hospitalization and ongoing recovery are:: Return home with Hospice CMS Medicare.gov Compare Post Acute Care list provided to:: Patient Choice offered to / list presented to : Patient  Discharge Placement                Patient to be transferred to facility by: [facility] Name of family member notified: Doren Custard Jr,Seraj Patient and family notified of of transfer: 09/26/20  Discharge Plan and Services In-house Referral: Clinical Social Work Discharge Planning Services: NA Post Acute Care Choice: Hospice          DME Arranged: N/A DME Agency: NA       HH Arranged: NA HH Agency: NA        Social Determinants of Health (SDOH) Interventions     Readmission Risk Interventions Readmission Risk Prevention Plan 09/24/2020  Transportation Screening Complete  HRI or Home Care Consult Complete  Social Work Consult for [place] Planning/Counseling Complete  Palliative Care Screening Not Applicable  Medication Review Press photographer) Complete  Some recent data might be hidden

## 2020-09-26 NOTE — Discharge Instructions (Signed)
Full liquid diet today, then advance to soft diet and continue advancing as tolerated. - Await pathology results. - Use Protonix (pantoprazole) 40 mg PO BID indefinitely. - Resume Eliquis at prior dose in 2 days.      IMPORTANT INFORMATION: PAY CLOSE ATTENTION   PHYSICIAN DISCHARGE INSTRUCTIONS  Follow with Primary care provider  David Sacramento, MD  and other consultants as instructed by your Hospitalist Physician  [place] IF SYMPTOMS COME BACK, WORSEN OR NEW PROBLEM DEVELOPS   Please note: You were cared for by a hospitalist during your hospital stay. Every effort will be made to forward records to your primary care provider.  You can request that your primary care provider send for your hospital records if they have not received them.  Once you are discharged, your primary care physician will handle any further medical issues. Please note that NO REFILLS for any discharge medications will be authorized once you are discharged, as it is imperative that you return to your primary care physician (or establish a relationship with a primary care physician if you do not have one) for your post hospital discharge needs so that they can reassess your need for medications and monitor your lab values.  Please get a complete blood count and chemistry panel checked by your Primary MD at your next visit, and again as instructed by your Primary MD.  Get Medicines reviewed and adjusted: Please take all your medications with you for your next visit with your Primary MD  Laboratory/radiological data: Please request your Primary MD to go over all hospital tests and procedure/radiological results at the follow up, please ask your primary care provider to get all Hospital records sent to his/her office.  In some cases, they will be blood work, cultures and biopsy results pending at the time of your discharge. Please request that your primary care provider follow up on  these results.  If you are diabetic, please bring your blood sugar readings with you to your follow up appointment with primary care.    Please call and make your follow up appointments as soon as possible.    Also Note the following: If you experience worsening of your admission symptoms, develop shortness of breath, life threatening emergency, suicidal or homicidal thoughts you must seek medical attention immediately by calling 911 or calling your MD immediately  if symptoms less severe.  You must read complete instructions/literature along with all the possible adverse reactions/side effects for all the Medicines you take and that have been prescribed to you. Take any new Medicines after you have completely understood and accpet all the possible adverse reactions/side effects.   Do not drive when taking Pain medications or sleeping medications (Benzodiazepines)  Do not take more than prescribed Pain, Sleep and Anxiety Medications. It is not advisable to combine anxiety,sleep and pain medications without talking with your primary care practitioner  Special Instructions: If you have smoked or chewed Tobacco  in the last 2 yrs please stop smoking, stop any regular Alcohol  and or any Recreational drug use.  Wear Seat belts while driving.  Do not drive if taking any narcotic, mind altering or controlled substances or recreational drugs or alcohol.

## 2020-09-28 ENCOUNTER — Ambulatory Visit: Payer: Medicare HMO | Admitting: Cardiovascular Disease

## 2020-09-28 LAB — TYPE AND SCREEN
ABO/RH(D): O POS
Antibody Screen: NEGATIVE
Unit division: 0
Unit division: 0
Unit division: 0
Unit division: 0

## 2020-09-28 LAB — BPAM RBC
Blood Product Expiration Date: 202203212359
Blood Product Expiration Date: 202203232359
Blood Product Expiration Date: 202203232359
Blood Product Expiration Date: 202203242359
ISSUE DATE / TIME: 202202172046
ISSUE DATE / TIME: 202202180008
Unit Type and Rh: 5100
Unit Type and Rh: 5100
Unit Type and Rh: 5100
Unit Type and Rh: 5100

## 2020-09-29 ENCOUNTER — Telehealth (INDEPENDENT_AMBULATORY_CARE_PROVIDER_SITE_OTHER): Payer: Self-pay | Admitting: Gastroenterology

## 2020-09-29 LAB — SURGICAL PATHOLOGY

## 2020-09-29 NOTE — Telephone Encounter (Signed)
I spoke with the patient's son regarding the results of the patient's biopsies which showed reactive gastritis without H. pylori and small bowel biopsies with peptic duodenitis. The patient told me that his father passed away on 11-09-22. I expressed my deep condolences to him and his family.  [name], MD Gastroenterology and Hepatology [facility]

## 2020-10-01 ENCOUNTER — Encounter (HOSPITAL_COMMUNITY): Payer: Self-pay | Admitting: Gastroenterology

## 2020-10-06 DEATH — deceased

## 2021-01-27 ENCOUNTER — Ambulatory Visit: Payer: Medicare HMO | Admitting: Cardiovascular Disease

## 2022-03-21 IMAGING — DX DG CHEST 1V PORT
1 series · 1 of 1 positions shown · non-contrast
Comparison: 03/24/2019

CLINICAL DATA: Short of breath, bradycardia

EXAM:
PORTABLE CHEST 1 VIEW

[chest ap]
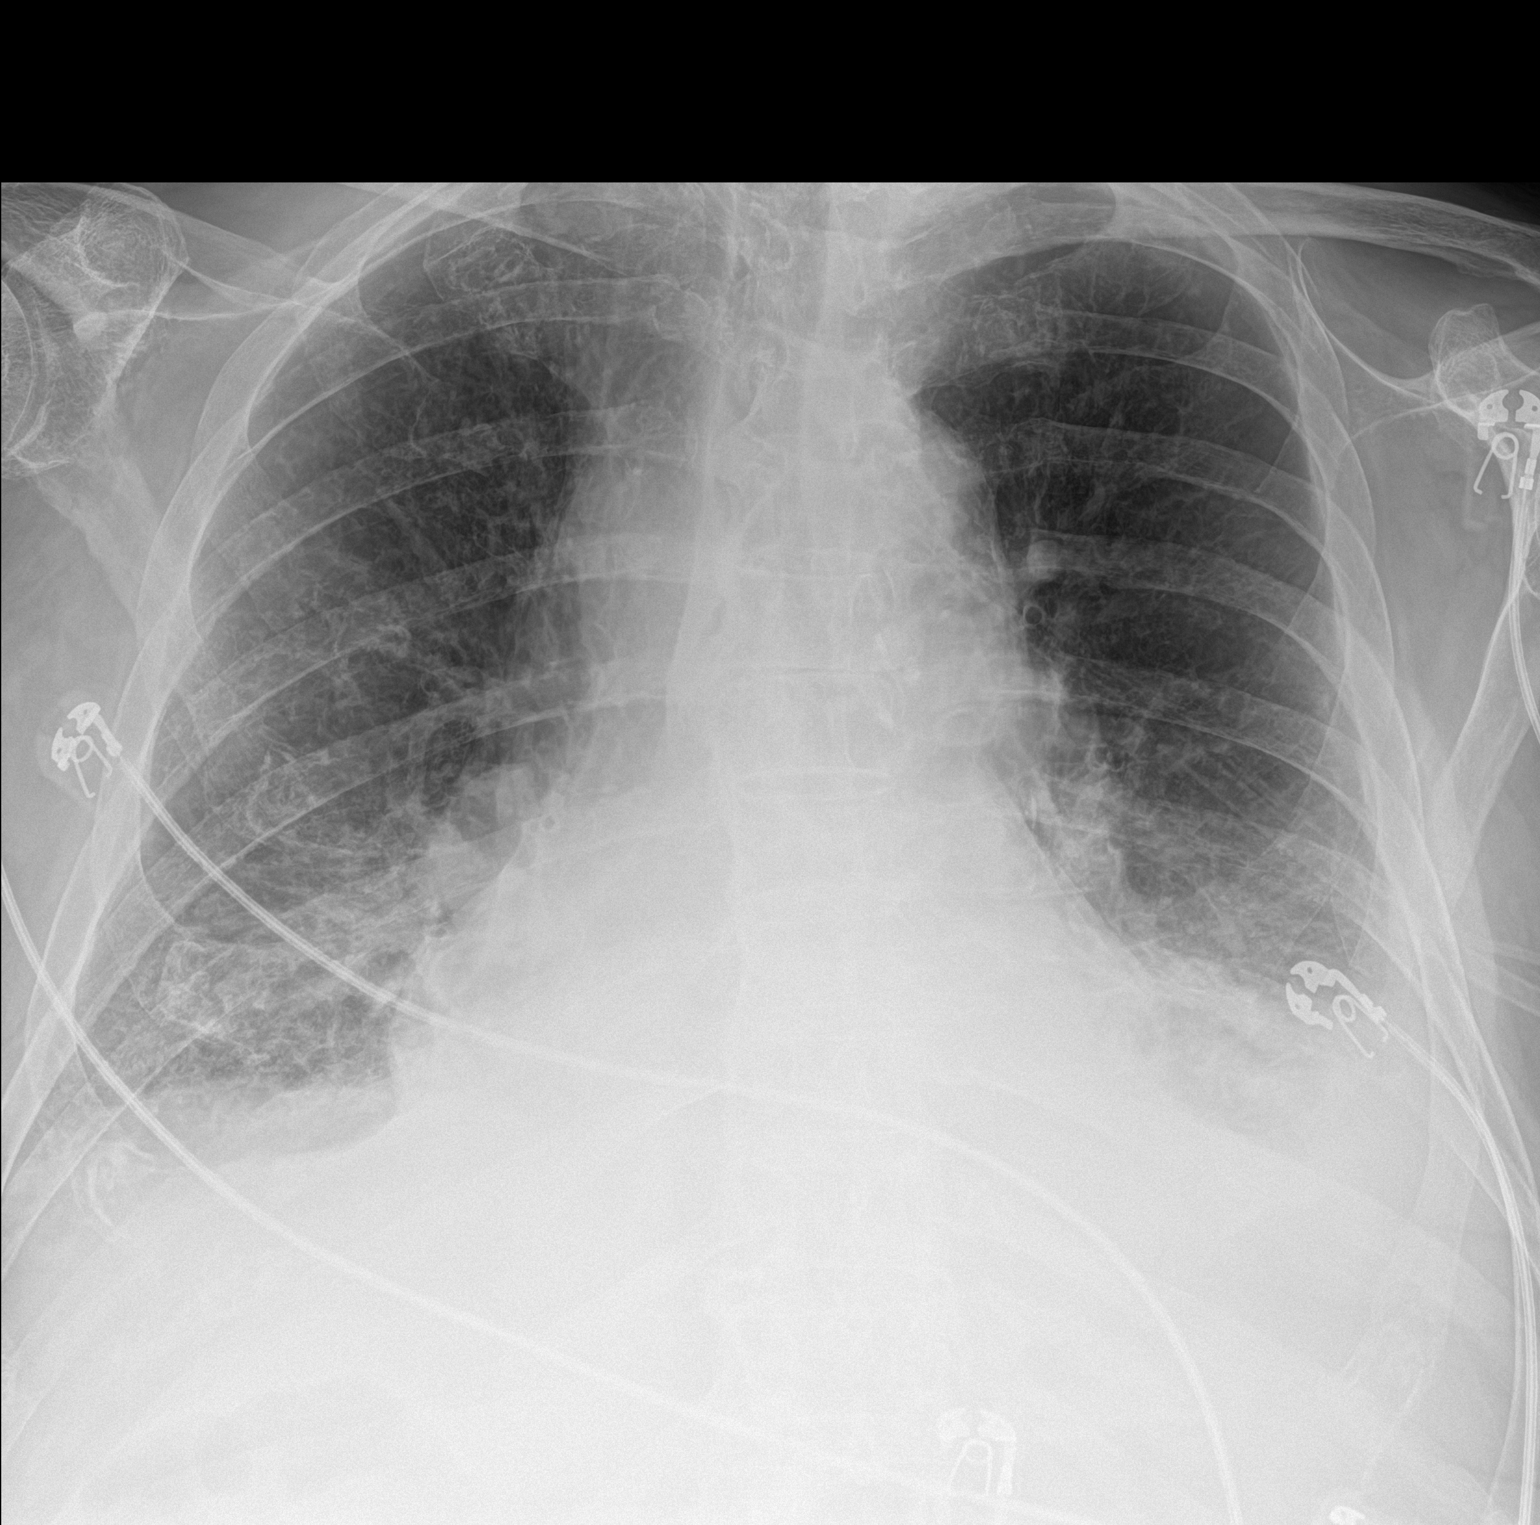

[1 of 1 positions shown; findings below may reference images not displayed]

FINDINGS: Stable large cardiac silhouette. Bilateral small pleural effusions.
Mild central venous congestion. No focal infiltrate. No overt
pulmonary edema. No pneumothorax.
IMPRESSION: Small bilateral pleural effusions and central venous congestion.

## 2022-03-28 IMAGING — CT CT HEAD W/O CM
3 series · 15 of 47 positions shown, 18 images · non-contrast
Comparison: 12/22/2009

CLINICAL DATA: Weakness, altered level of consciousness

EXAM:
CT HEAD WITHOUT CONTRAST
TECHNIQUE: Contiguous axial images were obtained from the base of the skull
through the vertex without intravenous contrast.

[Series 2: head w o · axial · 0.40mm/px · z∈[+36,+166]mm · 9 of 32 slices shown, 12 images]
[im 3/32  brain]
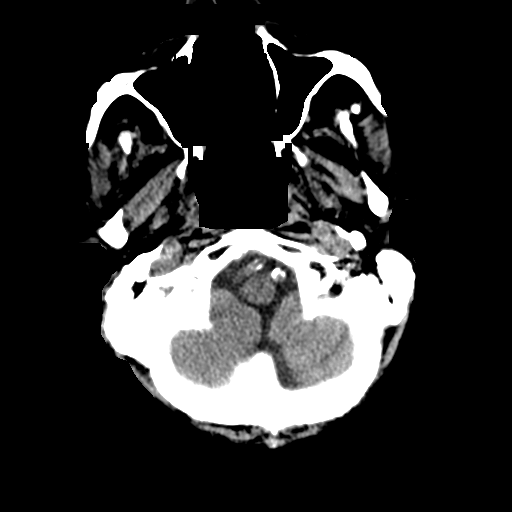
[im 3/32  bone]
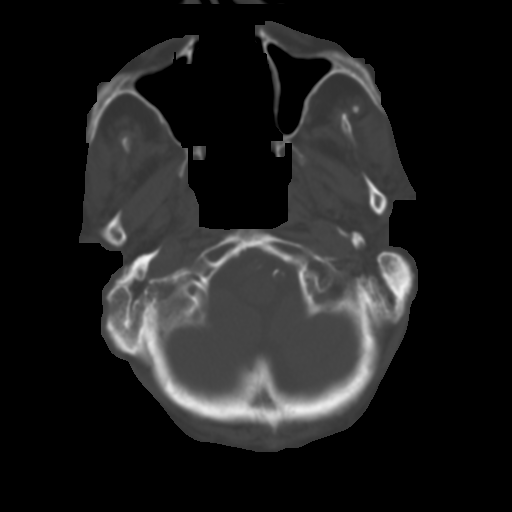
[im 6/32  brain]
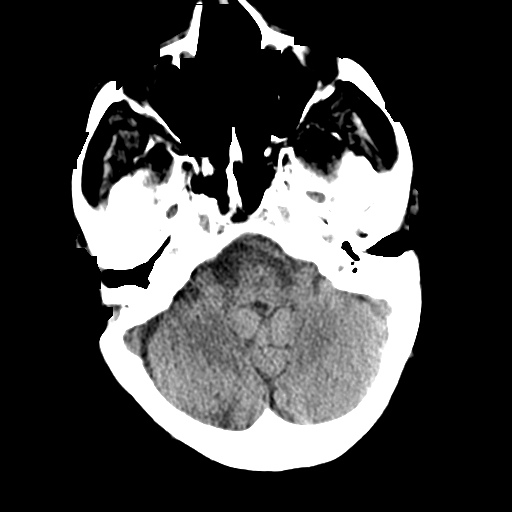
[im 9/32  brain]
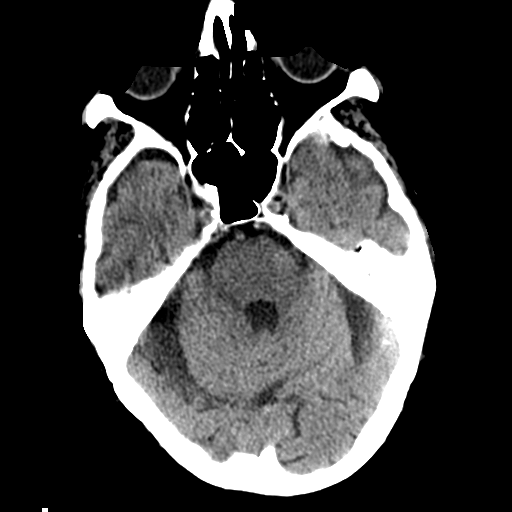
[im 12/32  brain]
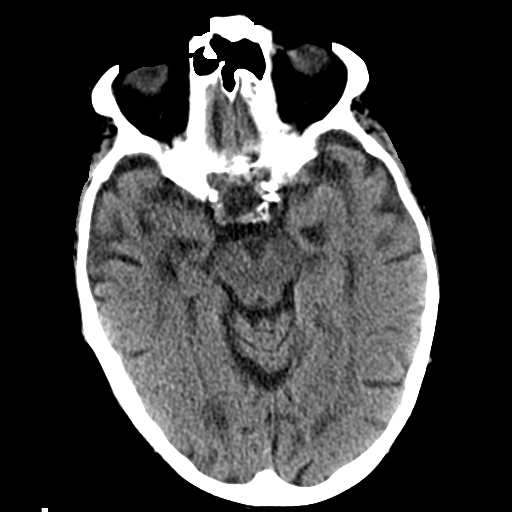
[im 17/32  brain]
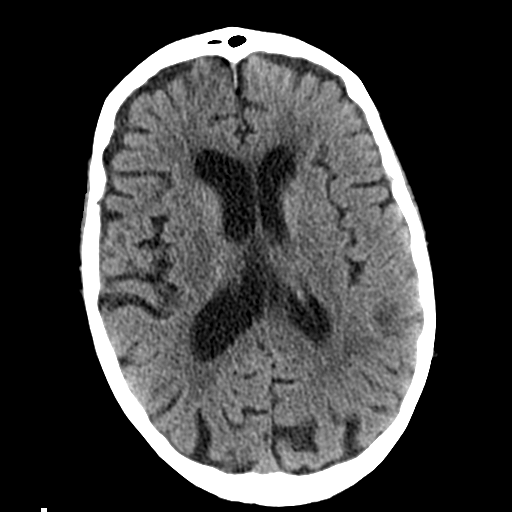
[im 17/32  bone]
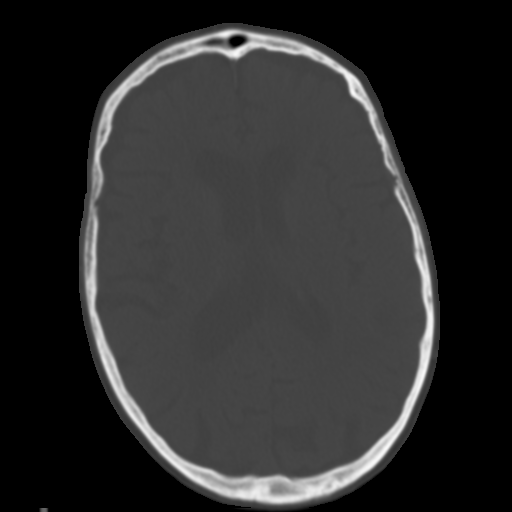
[im 20/32  brain]
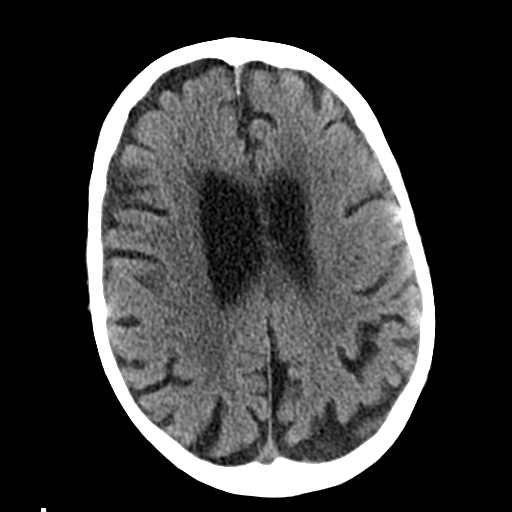
[im 23/32  brain]
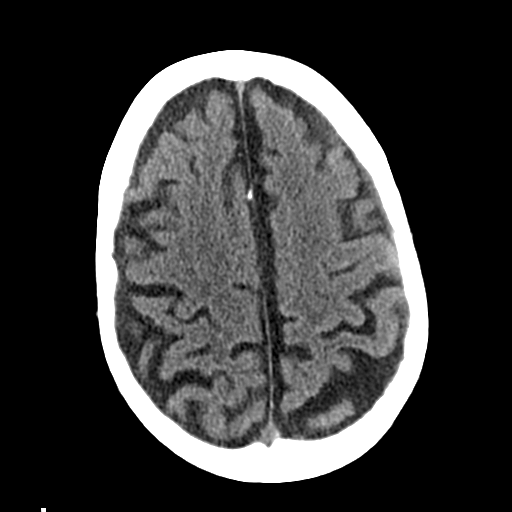
[im 26/32  brain]
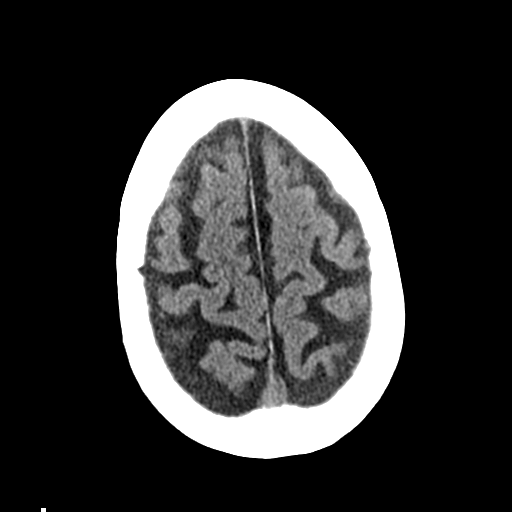
[im 29/32  brain]
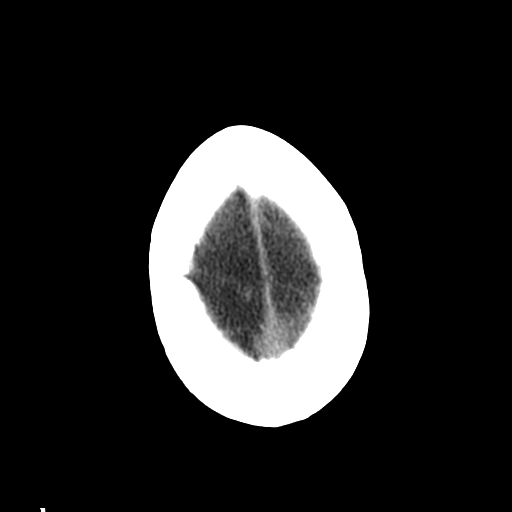
[im 29/32  bone]
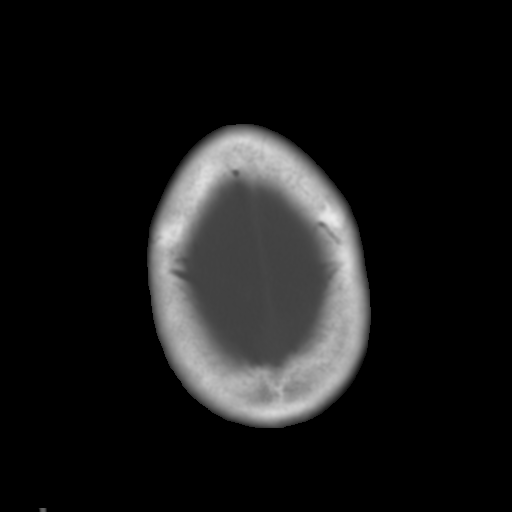

[Series 4: coronal soft · coronal · 0.32mm/px · 3 of 72 slices shown]
[im 24/72  brain]
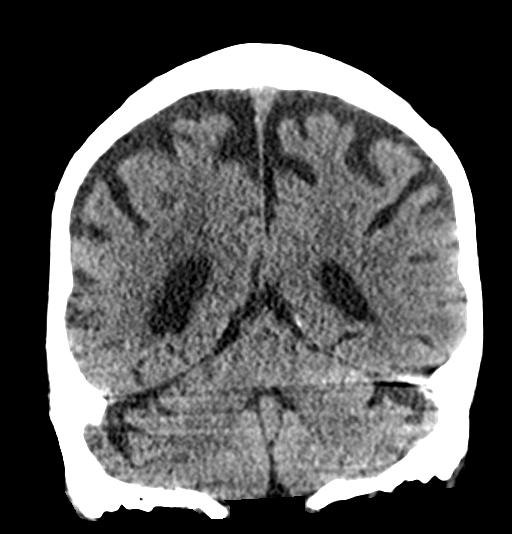
[im 32/72  brain]
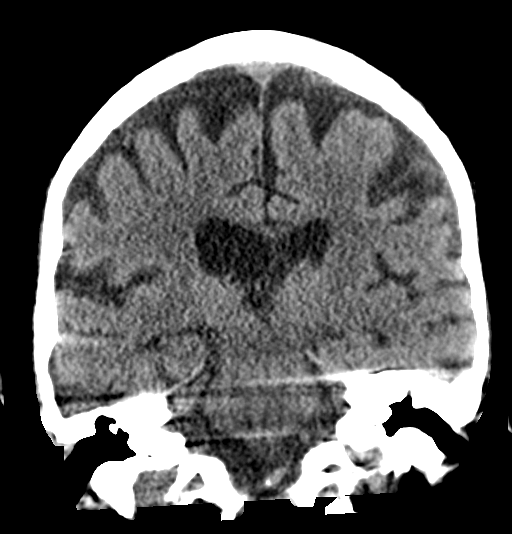
[im 40/72  brain]
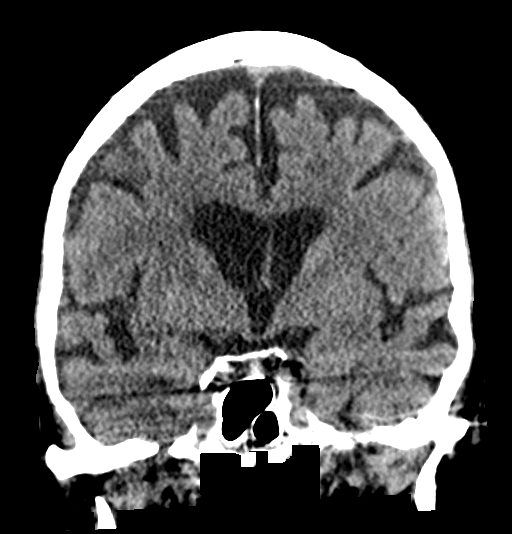

[Series 5: sagittal soft · sagittal · 0.32mm/px · 3 of 50 slices shown]
[im 17/50  brain]
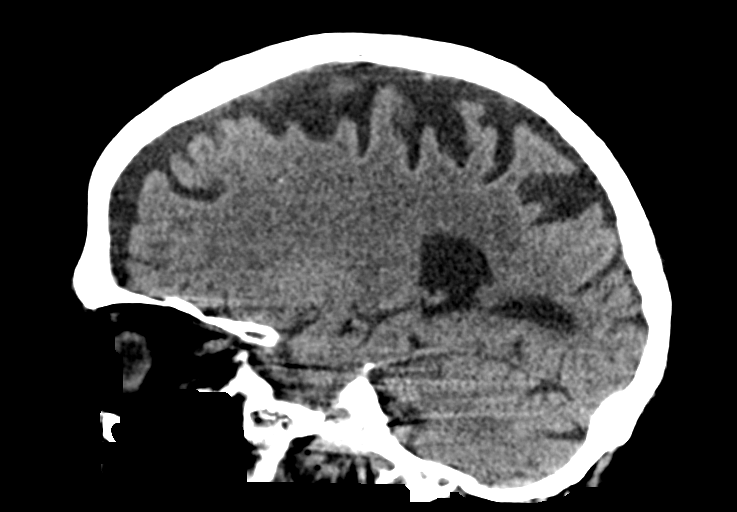
[im 25/50  brain]
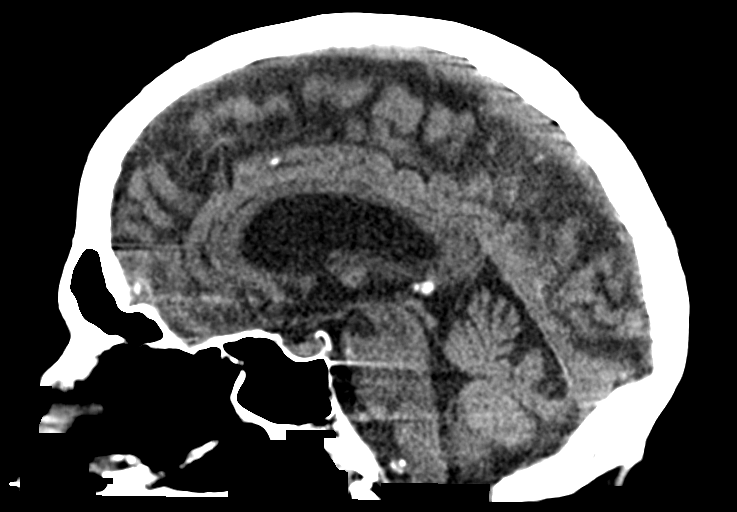
[im 33/50  brain]
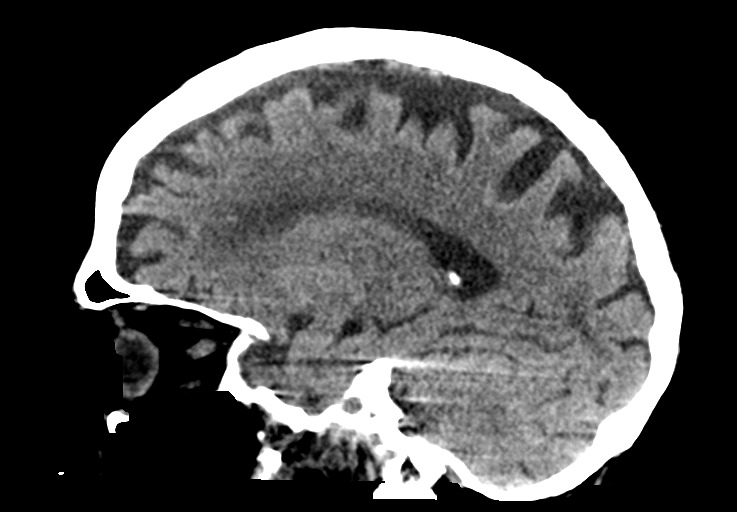

[15 of 47 positions shown; findings below may reference images not displayed]

FINDINGS: Brain: No acute infarct or hemorrhage. There is mild diffuse
cerebral atrophy, age-appropriate. Lateral ventricles and midline
structures are unremarkable. No acute extra-axial fluid collections.
No mass effect.

Vascular: No hyperdense vessel or unexpected calcification.

Skull: Normal. Negative for fracture or focal lesion.

Sinuses/Orbits: Postsurgical changes from right mastoidectomy.
Chronic bilateral mastoid effusions. Paranasal sinuses are clear.

Other: None.
IMPRESSION: 1. Age-appropriate cerebral atrophy.  No acute intracranial process.
